# Patient Record
Sex: Female | Born: 1960 | Hispanic: No | Smoking: Never smoker
Health system: Southern US, Community
[De-identification: ages and names within clinical notes are randomized; demographics above are authoritative.]

## PROBLEM LIST (undated history)

## (undated) DIAGNOSIS — I639 Cerebral infarction, unspecified: Secondary | ICD-10-CM

---

## 2016-01-31 ENCOUNTER — Ambulatory Visit (HOSPITAL_COMMUNITY)
Admission: EM | Admit: 2016-01-31 | Discharge: 2016-01-31 | Disposition: A | Discharge status: Home / Self Care | Payer: Self-pay | Attending: Family Medicine | Admitting: Family Medicine

## 2016-01-31 ENCOUNTER — Encounter (HOSPITAL_COMMUNITY): Payer: Self-pay | Admitting: *Deleted

## 2016-01-31 DIAGNOSIS — K2100 Gastro-esophageal reflux disease with esophagitis, without bleeding: Secondary | ICD-10-CM

## 2016-01-31 LAB — POCT I-STAT, CHEM 8
BUN: 17 mg/dL (ref 6–20)
CALCIUM ION: 1.27 mmol/L (ref 1.13–1.30)
CHLORIDE: 108 mmol/L (ref 101–111)
Creatinine, Ser: 0.8 mg/dL (ref 0.44–1.00)
Glucose, Bld: 93 mg/dL (ref 65–99)
HCT: 38 % (ref 36.0–46.0)
Hemoglobin: 12.9 g/dL (ref 12.0–15.0)
POTASSIUM: 4.1 mmol/L (ref 3.5–5.1)
SODIUM: 143 mmol/L (ref 135–145)
TCO2: 25 mmol/L (ref 0–100)

## 2016-01-31 LAB — POCT H PYLORI SCREEN: H. PYLORI SCREEN, POC: NEGATIVE

## 2016-01-31 MED ORDER — GI COCKTAIL ~~LOC~~
ORAL | Status: AC
  Filled 2016-01-31: qty 30

## 2016-01-31 MED ORDER — GI COCKTAIL ~~LOC~~
30.0000 mL | Freq: Once | ORAL | Status: AC
  Administered 2016-01-31: 30 mL via ORAL

## 2016-01-31 MED ORDER — RANITIDINE HCL 150 MG PO TABS: 150.0000 mg | ORAL_TABLET | Freq: Two times a day (BID) | ORAL | 0 refills | Status: DC

## 2016-01-31 NOTE — ED Triage Notes (Signed)
Pt has  A  History  Of   Diane RifeGerd   She reports     Burning  abd  Pain     With  Symptoms  Of pain    From  Belly button to throat      daughter has  Been  Giving  Pt  nexium  For the  Symptoms      Pt  Also  Reports  dizzyness  And  A  Headache     Pt  Reports

## 2016-01-31 NOTE — ED Provider Notes (Signed)
[facility]    CSN: [id number] Arrival date & time: 01/31/16  1012  First Provider Contact:  First MD Initiated Contact with Patient 01/31/16 1103        History   Chief Complaint Chief Complaint  Patient presents with  . Gastroesophageal Reflux    HPI Diane Berry is a 55 y.o. female.   The history is provided by the patient.  Gastroesophageal Reflux  This is a chronic problem. The current episode started more than 1 week ago (approx 14mo of sx per daughter., burning epigastric sx.). The problem has not changed since onset.Associated symptoms include abdominal pain.    History reviewed. No pertinent past medical history.  There are no active problems to display for this patient.   History reviewed. No pertinent surgical history.  OB History    No data available       Home Medications    Prior to Admission medications   Not on File    Family History History reviewed. No pertinent family history.  Social History Social History  Substance Use Topics  . Smoking status: Not on file  . Smokeless tobacco: Not on file  . Alcohol use Not on file     Allergies   Review of patient's allergies indicates not on file.   Review of Systems Review of Systems  Constitutional: Negative.  Negative for appetite change and fever.  Gastrointestinal: Positive for abdominal pain. Negative for abdominal distention, blood in stool, diarrhea, nausea and vomiting.  All other systems reviewed and are negative.    Physical Exam Triage Vital Signs ED Triage Vitals  Enc Vitals Group     BP 01/31/16 1031 175/97     Pulse Rate 01/31/16 1031 67     Resp 01/31/16 1031 16     Temp 01/31/16 1031 98.4 F (36.9 C)     Temp Source 01/31/16 1031 Oral     SpO2 01/31/16 1031 98 %     Weight --      Height --      Head Circumference --      Peak Flow --      Pain Score 01/31/16 1057 7     Pain Loc --      Pain Edu? --      Excl. in GC? --    No data  found.   Updated Vital Signs BP 175/97 (BP Location: Left Arm)   Pulse 67   Temp 98.4 F (36.9 C) (Oral)   Resp 16   SpO2 98%   Visual Acuity Right Eye Distance:   Left Eye Distance:   Bilateral Distance:    Right Eye Near:   Left Eye Near:    Bilateral Near:     Physical Exam  Constitutional: She is oriented to person, place, and time. She appears well-developed and well-nourished. No distress.  Cardiovascular: Normal rate, regular rhythm, normal heart sounds and intact distal pulses.   Pulmonary/Chest: Effort normal and breath sounds normal.  Abdominal: Soft. Bowel sounds are normal. She exhibits no mass. There is no tenderness. There is no rebound and no guarding. No hernia.  Neurological: She is alert and oriented to person, place, and time.  Skin: Skin is warm and dry.  Nursing note and vitals reviewed.    UC Treatments / Results  Labs (all labs ordered are listed, but only abnormal results are displayed) Labs Reviewed - No data to display  h pylori and I-stat neg  EKG  EKG Interpretation  None       Radiology No results found.  Procedures Procedures (including critical care time)  Medications Ordered in UC Medications - No data to display   Initial Impression / Assessment and Plan / UC Course  I have reviewed the triage vital signs and the nursing notes.  Pertinent labs & imaging results that were available during my care of the patient were reviewed by me and considered in my medical decision making (see chart for details).  Clinical Course      Final Clinical Impressions(s) / UC Diagnoses   Final diagnoses:  None    New Prescriptions New Prescriptions   No medications on file     Linna HoffJames D Kennidi Yoshida, MD 01/31/16 1211

## 2017-07-23 ENCOUNTER — Other Ambulatory Visit: Payer: Self-pay

## 2017-07-23 ENCOUNTER — Inpatient Hospital Stay (HOSPITAL_COMMUNITY)
Admission: EM | Admit: 2017-07-23 | Discharge: 2017-07-28 | DRG: 065 | Disposition: A | Discharge status: Home Health | Payer: Medicaid Other | Attending: Neurology | Admitting: Neurology

## 2017-07-23 ENCOUNTER — Encounter (HOSPITAL_COMMUNITY): Payer: Self-pay | Admitting: Emergency Medicine

## 2017-07-23 ENCOUNTER — Emergency Department (HOSPITAL_COMMUNITY): Payer: Medicaid Other

## 2017-07-23 DIAGNOSIS — I6622 Occlusion and stenosis of left posterior cerebral artery: Secondary | ICD-10-CM | POA: Diagnosis present

## 2017-07-23 DIAGNOSIS — E872 Acidosis, unspecified: Secondary | ICD-10-CM

## 2017-07-23 DIAGNOSIS — E669 Obesity, unspecified: Secondary | ICD-10-CM | POA: Diagnosis present

## 2017-07-23 DIAGNOSIS — R519 Headache, unspecified: Secondary | ICD-10-CM

## 2017-07-23 DIAGNOSIS — I672 Cerebral atherosclerosis: Secondary | ICD-10-CM | POA: Diagnosis present

## 2017-07-23 DIAGNOSIS — E876 Hypokalemia: Secondary | ICD-10-CM | POA: Diagnosis not present

## 2017-07-23 DIAGNOSIS — R809 Proteinuria, unspecified: Secondary | ICD-10-CM | POA: Diagnosis not present

## 2017-07-23 DIAGNOSIS — R6510 Systemic inflammatory response syndrome (SIRS) of non-infectious origin without acute organ dysfunction: Secondary | ICD-10-CM | POA: Diagnosis present

## 2017-07-23 DIAGNOSIS — E785 Hyperlipidemia, unspecified: Secondary | ICD-10-CM | POA: Diagnosis present

## 2017-07-23 DIAGNOSIS — Z8673 Personal history of transient ischemic attack (TIA), and cerebral infarction without residual deficits: Secondary | ICD-10-CM

## 2017-07-23 DIAGNOSIS — Z23 Encounter for immunization: Secondary | ICD-10-CM

## 2017-07-23 DIAGNOSIS — M4802 Spinal stenosis, cervical region: Secondary | ICD-10-CM | POA: Diagnosis present

## 2017-07-23 DIAGNOSIS — I700 Atherosclerosis of aorta: Secondary | ICD-10-CM | POA: Diagnosis present

## 2017-07-23 DIAGNOSIS — I161 Hypertensive emergency: Secondary | ICD-10-CM | POA: Diagnosis present

## 2017-07-23 DIAGNOSIS — R402142 Coma scale, eyes open, spontaneous, at arrival to emergency department: Secondary | ICD-10-CM | POA: Diagnosis present

## 2017-07-23 DIAGNOSIS — R402362 Coma scale, best motor response, obeys commands, at arrival to emergency department: Secondary | ICD-10-CM | POA: Diagnosis present

## 2017-07-23 DIAGNOSIS — D649 Anemia, unspecified: Secondary | ICD-10-CM | POA: Diagnosis present

## 2017-07-23 DIAGNOSIS — I10 Essential (primary) hypertension: Secondary | ICD-10-CM | POA: Diagnosis present

## 2017-07-23 DIAGNOSIS — I615 Nontraumatic intracerebral hemorrhage, intraventricular: Secondary | ICD-10-CM

## 2017-07-23 DIAGNOSIS — I6522 Occlusion and stenosis of left carotid artery: Secondary | ICD-10-CM | POA: Diagnosis present

## 2017-07-23 DIAGNOSIS — R030 Elevated blood-pressure reading, without diagnosis of hypertension: Secondary | ICD-10-CM | POA: Diagnosis present

## 2017-07-23 DIAGNOSIS — R402252 Coma scale, best verbal response, oriented, at arrival to emergency department: Secondary | ICD-10-CM | POA: Diagnosis present

## 2017-07-23 DIAGNOSIS — R6889 Other general symptoms and signs: Secondary | ICD-10-CM

## 2017-07-23 DIAGNOSIS — I629 Nontraumatic intracranial hemorrhage, unspecified: Secondary | ICD-10-CM | POA: Diagnosis present

## 2017-07-23 DIAGNOSIS — N179 Acute kidney failure, unspecified: Secondary | ICD-10-CM | POA: Diagnosis not present

## 2017-07-23 DIAGNOSIS — M50322 Other cervical disc degeneration at C5-C6 level: Secondary | ICD-10-CM | POA: Diagnosis present

## 2017-07-23 DIAGNOSIS — Z6833 Body mass index (BMI) 33.0-33.9, adult: Secondary | ICD-10-CM

## 2017-07-23 DIAGNOSIS — R29701 NIHSS score 1: Secondary | ICD-10-CM | POA: Diagnosis present

## 2017-07-23 DIAGNOSIS — R51 Headache: Secondary | ICD-10-CM

## 2017-07-23 DIAGNOSIS — I619 Nontraumatic intracerebral hemorrhage, unspecified: Secondary | ICD-10-CM | POA: Diagnosis present

## 2017-07-23 DIAGNOSIS — I611 Nontraumatic intracerebral hemorrhage in hemisphere, cortical: Secondary | ICD-10-CM

## 2017-07-23 DIAGNOSIS — Z8249 Family history of ischemic heart disease and other diseases of the circulatory system: Secondary | ICD-10-CM

## 2017-07-23 DIAGNOSIS — E871 Hypo-osmolality and hyponatremia: Secondary | ICD-10-CM | POA: Diagnosis not present

## 2017-07-23 HISTORY — DX: Cerebral infarction, unspecified: I63.9

## 2017-07-23 LAB — CBC WITH DIFFERENTIAL/PLATELET
BASOS ABS: 0 10*3/uL (ref 0.0–0.1)
Basophils Relative: 0 %
Eosinophils Absolute: 0.1 10*3/uL (ref 0.0–0.7)
Eosinophils Relative: 1 %
HEMATOCRIT: 36.6 % (ref 36.0–46.0)
HEMOGLOBIN: 11.7 g/dL — AB (ref 12.0–15.0)
LYMPHS PCT: 31 %
Lymphs Abs: 2.2 10*3/uL (ref 0.7–4.0)
MCH: 22.9 pg — ABNORMAL LOW (ref 26.0–34.0)
MCHC: 32 g/dL (ref 30.0–36.0)
MCV: 71.8 fL — ABNORMAL LOW (ref 78.0–100.0)
MONOS PCT: 4 %
Monocytes Absolute: 0.3 10*3/uL (ref 0.1–1.0)
NEUTROS PCT: 64 %
Neutro Abs: 4.4 10*3/uL (ref 1.7–7.7)
Platelets: 232 10*3/uL (ref 150–400)
RBC: 5.1 MIL/uL (ref 3.87–5.11)
RDW: 15.4 % (ref 11.5–15.5)
WBC: 7 10*3/uL (ref 4.0–10.5)

## 2017-07-23 LAB — URINALYSIS, ROUTINE W REFLEX MICROSCOPIC
BACTERIA UA: NONE SEEN
BILIRUBIN URINE: NEGATIVE
GLUCOSE, UA: NEGATIVE mg/dL
Ketones, ur: NEGATIVE mg/dL
LEUKOCYTES UA: NEGATIVE
NITRITE: NEGATIVE
Specific Gravity, Urine: 1.024 (ref 1.005–1.030)
pH: 5 (ref 5.0–8.0)

## 2017-07-23 LAB — I-STAT BETA HCG BLOOD, ED (MC, WL, AP ONLY)

## 2017-07-23 LAB — COMPREHENSIVE METABOLIC PANEL
ALT: 16 U/L (ref 14–54)
AST: 41 U/L (ref 15–41)
Albumin: 4.5 g/dL (ref 3.5–5.0)
Alkaline Phosphatase: 96 U/L (ref 38–126)
Anion gap: 11 (ref 5–15)
BUN: 11 mg/dL (ref 6–20)
CHLORIDE: 106 mmol/L (ref 101–111)
CO2: 20 mmol/L — AB (ref 22–32)
Calcium: 9.7 mg/dL (ref 8.9–10.3)
Creatinine, Ser: 0.76 mg/dL (ref 0.44–1.00)
GFR calc non Af Amer: >60 mL/min (ref >60)
Glucose, Bld: 126 mg/dL — ABNORMAL HIGH (ref 65–99)
Potassium: 3.6 mmol/L (ref 3.5–5.1)
SODIUM: 137 mmol/L (ref 135–145)
Total Bilirubin: 1.2 mg/dL (ref 0.3–1.2)
Total Protein: 8.4 g/dL — ABNORMAL HIGH (ref 6.5–8.1)

## 2017-07-23 LAB — I-STAT CG4 LACTIC ACID, ED: Lactic Acid, Venous: 3.83 mmol/L (ref 0.5–1.9)

## 2017-07-23 NOTE — ED Notes (Signed)
Notified nurse first istat lactic acid results.

## 2017-07-23 NOTE — ED Notes (Signed)
Dr. Blinda LeatherwoodPollina notified of lactic acid results of 3.83 and pt going to D[facility].

## 2017-07-23 NOTE — ED Triage Notes (Signed)
Pt having generalized body ache, with fever, chills, nausea and vomiting since this morning.

## 2017-07-24 ENCOUNTER — Inpatient Hospital Stay (HOSPITAL_COMMUNITY): Payer: Medicaid Other

## 2017-07-24 ENCOUNTER — Observation Stay (HOSPITAL_COMMUNITY): Payer: Medicaid Other

## 2017-07-24 ENCOUNTER — Emergency Department (HOSPITAL_COMMUNITY): Payer: Medicaid Other

## 2017-07-24 ENCOUNTER — Encounter (HOSPITAL_COMMUNITY): Payer: Self-pay | Admitting: Internal Medicine

## 2017-07-24 DIAGNOSIS — D509 Iron deficiency anemia, unspecified: Secondary | ICD-10-CM

## 2017-07-24 DIAGNOSIS — R402362 Coma scale, best motor response, obeys commands, at arrival to emergency department: Secondary | ICD-10-CM | POA: Diagnosis present

## 2017-07-24 DIAGNOSIS — Z8249 Family history of ischemic heart disease and other diseases of the circulatory system: Secondary | ICD-10-CM | POA: Diagnosis not present

## 2017-07-24 DIAGNOSIS — N179 Acute kidney failure, unspecified: Secondary | ICD-10-CM | POA: Diagnosis not present

## 2017-07-24 DIAGNOSIS — I161 Hypertensive emergency: Secondary | ICD-10-CM | POA: Diagnosis present

## 2017-07-24 DIAGNOSIS — I615 Nontraumatic intracerebral hemorrhage, intraventricular: Secondary | ICD-10-CM | POA: Diagnosis present

## 2017-07-24 DIAGNOSIS — R402252 Coma scale, best verbal response, oriented, at arrival to emergency department: Secondary | ICD-10-CM | POA: Diagnosis present

## 2017-07-24 DIAGNOSIS — R29701 NIHSS score 1: Secondary | ICD-10-CM | POA: Diagnosis present

## 2017-07-24 DIAGNOSIS — M50322 Other cervical disc degeneration at C5-C6 level: Secondary | ICD-10-CM | POA: Diagnosis present

## 2017-07-24 DIAGNOSIS — E785 Hyperlipidemia, unspecified: Secondary | ICD-10-CM | POA: Diagnosis present

## 2017-07-24 DIAGNOSIS — E872 Acidosis: Secondary | ICD-10-CM | POA: Diagnosis present

## 2017-07-24 DIAGNOSIS — E876 Hypokalemia: Secondary | ICD-10-CM | POA: Diagnosis not present

## 2017-07-24 DIAGNOSIS — I672 Cerebral atherosclerosis: Secondary | ICD-10-CM | POA: Diagnosis present

## 2017-07-24 DIAGNOSIS — R809 Proteinuria, unspecified: Secondary | ICD-10-CM | POA: Diagnosis not present

## 2017-07-24 DIAGNOSIS — Z6833 Body mass index (BMI) 33.0-33.9, adult: Secondary | ICD-10-CM | POA: Diagnosis not present

## 2017-07-24 DIAGNOSIS — R402142 Coma scale, eyes open, spontaneous, at arrival to emergency department: Secondary | ICD-10-CM | POA: Diagnosis present

## 2017-07-24 DIAGNOSIS — D649 Anemia, unspecified: Secondary | ICD-10-CM | POA: Diagnosis present

## 2017-07-24 DIAGNOSIS — E669 Obesity, unspecified: Secondary | ICD-10-CM | POA: Diagnosis present

## 2017-07-24 DIAGNOSIS — I700 Atherosclerosis of aorta: Secondary | ICD-10-CM | POA: Diagnosis present

## 2017-07-24 DIAGNOSIS — M4802 Spinal stenosis, cervical region: Secondary | ICD-10-CM | POA: Diagnosis present

## 2017-07-24 DIAGNOSIS — R6510 Systemic inflammatory response syndrome (SIRS) of non-infectious origin without acute organ dysfunction: Secondary | ICD-10-CM

## 2017-07-24 DIAGNOSIS — I619 Nontraumatic intracerebral hemorrhage, unspecified: Secondary | ICD-10-CM | POA: Diagnosis present

## 2017-07-24 DIAGNOSIS — R030 Elevated blood-pressure reading, without diagnosis of hypertension: Secondary | ICD-10-CM | POA: Diagnosis present

## 2017-07-24 DIAGNOSIS — Z23 Encounter for immunization: Secondary | ICD-10-CM | POA: Diagnosis not present

## 2017-07-24 DIAGNOSIS — I629 Nontraumatic intracranial hemorrhage, unspecified: Secondary | ICD-10-CM | POA: Diagnosis present

## 2017-07-24 DIAGNOSIS — Z8673 Personal history of transient ischemic attack (TIA), and cerebral infarction without residual deficits: Secondary | ICD-10-CM | POA: Diagnosis not present

## 2017-07-24 DIAGNOSIS — I639 Cerebral infarction, unspecified: Secondary | ICD-10-CM

## 2017-07-24 DIAGNOSIS — I10 Essential (primary) hypertension: Secondary | ICD-10-CM | POA: Diagnosis present

## 2017-07-24 DIAGNOSIS — E871 Hypo-osmolality and hyponatremia: Secondary | ICD-10-CM | POA: Diagnosis not present

## 2017-07-24 HISTORY — DX: Cerebral infarction, unspecified: I63.9

## 2017-07-24 LAB — COMPREHENSIVE METABOLIC PANEL
ALBUMIN: 4.1 g/dL (ref 3.5–5.0)
ALT: 16 U/L (ref 14–54)
AST: 37 U/L (ref 15–41)
Alkaline Phosphatase: 87 U/L (ref 38–126)
Anion gap: 13 (ref 5–15)
BUN: 9 mg/dL (ref 6–20)
CHLORIDE: 105 mmol/L (ref 101–111)
CO2: 19 mmol/L — AB (ref 22–32)
Calcium: 8.9 mg/dL (ref 8.9–10.3)
Creatinine, Ser: 0.77 mg/dL (ref 0.44–1.00)
GFR calc Af Amer: >60 mL/min (ref >60)
GLUCOSE: 133 mg/dL — AB (ref 65–99)
POTASSIUM: 3.4 mmol/L — AB (ref 3.5–5.1)
SODIUM: 137 mmol/L (ref 135–145)
Total Bilirubin: 1.2 mg/dL (ref 0.3–1.2)
Total Protein: 7.9 g/dL (ref 6.5–8.1)

## 2017-07-24 LAB — VITAMIN B12: VITAMIN B 12: 1254 pg/mL — AB (ref 180–914)

## 2017-07-24 LAB — CBC WITH DIFFERENTIAL/PLATELET
Basophils Absolute: 0 10*3/uL (ref 0.0–0.1)
Basophils Relative: 0 %
EOS PCT: 0 %
Eosinophils Absolute: 0 10*3/uL (ref 0.0–0.7)
HCT: 35 % — ABNORMAL LOW (ref 36.0–46.0)
Hemoglobin: 11 g/dL — ABNORMAL LOW (ref 12.0–15.0)
LYMPHS ABS: 1 10*3/uL (ref 0.7–4.0)
LYMPHS PCT: 13 %
MCH: 22.6 pg — AB (ref 26.0–34.0)
MCHC: 31.4 g/dL (ref 30.0–36.0)
MCV: 72 fL — AB (ref 78.0–100.0)
MONOS PCT: 2 %
Monocytes Absolute: 0.1 10*3/uL (ref 0.1–1.0)
Neutro Abs: 6.4 10*3/uL (ref 1.7–7.7)
Neutrophils Relative %: 85 %
PLATELETS: 208 10*3/uL (ref 150–400)
RBC: 4.86 MIL/uL (ref 3.87–5.11)
RDW: 15.4 % (ref 11.5–15.5)
WBC: 7.6 10*3/uL (ref 4.0–10.5)

## 2017-07-24 LAB — INFLUENZA PANEL BY PCR (TYPE A & B)
Influenza A By PCR: NEGATIVE
Influenza B By PCR: NEGATIVE

## 2017-07-24 LAB — PROTIME-INR
INR: 0.99
Prothrombin Time: 13 seconds (ref 11.4–15.2)

## 2017-07-24 LAB — FOLATE: FOLATE: 23.6 ng/mL (ref >5.9)

## 2017-07-24 LAB — FERRITIN: Ferritin: 188 ng/mL (ref 11–307)

## 2017-07-24 LAB — HEMOGLOBIN A1C
Hgb A1c MFr Bld: 5.1 % (ref 4.8–5.6)
Mean Plasma Glucose: 99.67 mg/dL

## 2017-07-24 LAB — GLUCOSE, CAPILLARY
Glucose-Capillary: 131 mg/dL — ABNORMAL HIGH (ref 65–99)
Glucose-Capillary: 124 mg/dL — ABNORMAL HIGH (ref 65–99)

## 2017-07-24 LAB — RETICULOCYTES
RBC.: 4.75 MIL/uL (ref 3.87–5.11)
RETIC CT PCT: 2.5 % (ref 0.4–3.1)
Retic Count, Absolute: 118.8 10*3/uL (ref 19.0–186.0)

## 2017-07-24 LAB — IRON AND TIBC
Iron: 42 ug/dL (ref 28–170)
SATURATION RATIOS: 16 % (ref 10.4–31.8)
TIBC: 265 ug/dL (ref 250–450)
UIBC: 223 ug/dL

## 2017-07-24 LAB — MRSA PCR SCREENING: MRSA by PCR: NEGATIVE

## 2017-07-24 LAB — HIV ANTIBODY (ROUTINE TESTING W REFLEX): HIV Screen 4th Generation wRfx: NONREACTIVE

## 2017-07-24 LAB — I-STAT CG4 LACTIC ACID, ED: LACTIC ACID, VENOUS: 1.43 mmol/L (ref 0.5–1.9)

## 2017-07-24 LAB — TROPONIN I: Troponin I: 0.03 ng/mL (ref <0.03)

## 2017-07-24 LAB — TSH: TSH: 0.26 u[IU]/mL — ABNORMAL LOW (ref 0.350–4.500)

## 2017-07-24 LAB — T4, FREE: Free T4: 0.95 ng/dL (ref 0.61–1.12)

## 2017-07-24 MED ORDER — DIPHENHYDRAMINE HCL 50 MG/ML IJ SOLN
25.0000 mg | Freq: Once | INTRAMUSCULAR | Status: AC
  Administered 2017-07-24: 25 mg via INTRAVENOUS
  Filled 2017-07-24: qty 1

## 2017-07-24 MED ORDER — PIPERACILLIN-TAZOBACTAM 3.375 G IVPB
3.3750 g | Freq: Three times a day (TID) | INTRAVENOUS | Status: DC
  Administered 2017-07-24: 3.375 g via INTRAVENOUS
  Filled 2017-07-24 (×2): qty 50

## 2017-07-24 MED ORDER — PIPERACILLIN-TAZOBACTAM 3.375 G IVPB 30 MIN
3.3750 g | Freq: Once | INTRAVENOUS | Status: AC
  Administered 2017-07-24: 3.375 g via INTRAVENOUS
  Filled 2017-07-24: qty 50

## 2017-07-24 MED ORDER — OSELTAMIVIR PHOSPHATE 75 MG PO CAPS
75.0000 mg | ORAL_CAPSULE | Freq: Once | ORAL | Status: AC
  Administered 2017-07-24: 75 mg via ORAL
  Filled 2017-07-24: qty 1

## 2017-07-24 MED ORDER — GADOBENATE DIMEGLUMINE 529 MG/ML IV SOLN
10.0000 mL | Freq: Once | INTRAVENOUS | Status: AC | PRN
Start: 2017-07-24 — End: 2017-07-24
  Administered 2017-07-24: 10 mL via INTRAVENOUS

## 2017-07-24 MED ORDER — ONDANSETRON HCL 4 MG/2ML IJ SOLN: 4.0000 mg | Freq: Four times a day (QID) | INTRAMUSCULAR | Status: DC | PRN

## 2017-07-24 MED ORDER — SENNOSIDES-DOCUSATE SODIUM 8.6-50 MG PO TABS
1.0000 | ORAL_TABLET | Freq: Two times a day (BID) | ORAL | Status: DC
  Administered 2017-07-24 – 2017-07-28 (×8): 1 via ORAL
  Filled 2017-07-24 (×8): qty 1

## 2017-07-24 MED ORDER — ACETAMINOPHEN 325 MG PO TABS
650.0000 mg | ORAL_TABLET | Freq: Four times a day (QID) | ORAL | Status: DC | PRN
  Administered 2017-07-24 – 2017-07-27 (×8): 650 mg via ORAL
  Filled 2017-07-24 (×8): qty 2

## 2017-07-24 MED ORDER — HYDRALAZINE HCL 20 MG/ML IJ SOLN
10.0000 mg | INTRAMUSCULAR | Status: DC | PRN
  Administered 2017-07-24 – 2017-07-26 (×4): 10 mg via INTRAVENOUS
  Filled 2017-07-24 (×4): qty 1

## 2017-07-24 MED ORDER — VANCOMYCIN HCL IN DEXTROSE 750-5 MG/150ML-% IV SOLN: 750.0000 mg | INTRAVENOUS | Status: DC

## 2017-07-24 MED ORDER — SODIUM CHLORIDE 0.9 % IV BOLUS (SEPSIS)
500.0000 mL | Freq: Once | INTRAVENOUS | Status: AC
  Administered 2017-07-24: 500 mL via INTRAVENOUS

## 2017-07-24 MED ORDER — SODIUM CHLORIDE 0.9 % IV BOLUS (SEPSIS)
1000.0000 mL | Freq: Once | INTRAVENOUS | Status: AC
  Administered 2017-07-24: 1000 mL via INTRAVENOUS

## 2017-07-24 MED ORDER — DIPHENHYDRAMINE HCL 50 MG/ML IJ SOLN
25.0000 mg | Freq: Every day | INTRAMUSCULAR | Status: DC
  Administered 2017-07-25 – 2017-07-28 (×2): 25 mg via INTRAVENOUS
  Filled 2017-07-24 (×2): qty 1

## 2017-07-24 MED ORDER — VANCOMYCIN HCL IN DEXTROSE 1-5 GM/200ML-% IV SOLN
1000.0000 mg | Freq: Once | INTRAVENOUS | Status: AC
  Administered 2017-07-24: 1000 mg via INTRAVENOUS
  Filled 2017-07-24: qty 200

## 2017-07-24 MED ORDER — PANTOPRAZOLE SODIUM 40 MG IV SOLR
40.0000 mg | Freq: Every day | INTRAVENOUS | Status: DC
  Administered 2017-07-24: 40 mg via INTRAVENOUS
  Filled 2017-07-24: qty 40

## 2017-07-24 MED ORDER — INFLUENZA VAC SPLIT QUAD 0.5 ML IM SUSY
0.5000 mL | PREFILLED_SYRINGE | INTRAMUSCULAR | Status: AC
  Administered 2017-07-25: 0.5 mL via INTRAMUSCULAR
  Filled 2017-07-24: qty 0.5

## 2017-07-24 MED ORDER — HYDROCHLOROTHIAZIDE 25 MG PO TABS
25.0000 mg | ORAL_TABLET | Freq: Every day | ORAL | Status: DC
  Administered 2017-07-24 – 2017-07-28 (×4): 25 mg via ORAL
  Filled 2017-07-24 (×4): qty 1

## 2017-07-24 MED ORDER — IOPAMIDOL (ISOVUE-370) INJECTION 76%
INTRAVENOUS | Status: AC
  Administered 2017-07-24: 50 mL
  Filled 2017-07-24: qty 50

## 2017-07-24 MED ORDER — ENOXAPARIN SODIUM 40 MG/0.4ML ~~LOC~~ SOLN: 40.0000 mg | Freq: Every day | SUBCUTANEOUS | Status: DC

## 2017-07-24 MED ORDER — HYDRALAZINE HCL 20 MG/ML IJ SOLN
10.0000 mg | INTRAMUSCULAR | Status: DC | PRN
  Administered 2017-07-24: 10 mg via INTRAVENOUS
  Filled 2017-07-24: qty 1

## 2017-07-24 MED ORDER — LISINOPRIL 10 MG PO TABS
10.0000 mg | ORAL_TABLET | Freq: Every day | ORAL | Status: DC
  Administered 2017-07-24 – 2017-07-28 (×4): 10 mg via ORAL
  Filled 2017-07-24 (×4): qty 1

## 2017-07-24 MED ORDER — NICARDIPINE HCL IN NACL 20-0.86 MG/200ML-% IV SOLN
3.0000 mg/h | INTRAVENOUS | Status: DC
  Administered 2017-07-24: 10 mg/h via INTRAVENOUS
  Administered 2017-07-24 (×2): 5 mg/h via INTRAVENOUS
  Administered 2017-07-24: 7.5 mg/h via INTRAVENOUS
  Administered 2017-07-25 (×2): 3 mg/h via INTRAVENOUS
  Filled 2017-07-24 (×6): qty 200

## 2017-07-24 MED ORDER — ONDANSETRON HCL 4 MG PO TABS: 4.0000 mg | ORAL_TABLET | Freq: Four times a day (QID) | ORAL | Status: DC | PRN

## 2017-07-24 MED ORDER — ACETAMINOPHEN 650 MG RE SUPP: 650.0000 mg | Freq: Four times a day (QID) | RECTAL | Status: DC | PRN

## 2017-07-24 MED ORDER — LABETALOL HCL 5 MG/ML IV SOLN
5.0000 mg | Freq: Once | INTRAVENOUS | Status: AC
  Administered 2017-07-24: 5 mg via INTRAVENOUS
  Filled 2017-07-24: qty 4

## 2017-07-24 MED ORDER — LABETALOL HCL 5 MG/ML IV SOLN
20.0000 mg | Freq: Once | INTRAVENOUS | Status: DC
  Filled 2017-07-24: qty 4

## 2017-07-24 MED ORDER — SODIUM CHLORIDE 0.9 % IV SOLN
INTRAVENOUS | Status: DC
  Administered 2017-07-24: 05:00:00 via INTRAVENOUS

## 2017-07-24 MED ORDER — STROKE: EARLY STAGES OF RECOVERY BOOK
Freq: Once | Status: AC
  Administered 2017-07-24: 11:00:00
  Filled 2017-07-24: qty 1

## 2017-07-24 MED ORDER — IOPAMIDOL (ISOVUE-300) INJECTION 61%
INTRAVENOUS | Status: AC
  Administered 2017-07-24: 100 mL
  Filled 2017-07-24: qty 100

## 2017-07-24 MED ORDER — SODIUM CHLORIDE 0.9 % IV BOLUS (SEPSIS)
250.0000 mL | Freq: Once | INTRAVENOUS | Status: AC
  Administered 2017-07-24: 250 mL via INTRAVENOUS

## 2017-07-24 NOTE — H&P (Addendum)
History and Physical    Mechele CollinHhlek Rah Lan ZOX:[id number] DOB: 09/05/1960 DOA: 07/23/2017  PCP: System, Pcp Not In  Patient coming from: Home.  Patient's daughter provided [place] translation.  Chief Complaint: Generalized body ache.  HPI: Diane Berry is a 57 y.o. female with history of elevated blood pressure per patient's daughter who has been in [place] last [place] years prior to that she was in [place] presents to the ER with complaints of generalized body ache over the last 2 days.  Patient also has been having multiple complaints including abdominal discomfort and headache.  Denies any nausea vomiting diarrhea chest pain shortness of breath or productive cough.  ED Course: In the ER CT abdomen and pelvis done showed nonspecific findings.  Patient had elevated lactate levels which improved with fluids.  Patient was mildly febrile for which blood cultures urine and urine cultures were sent influenza PCR was done which was negative patient's lactate levels improved with fluids.  Patient had markedly elevated blood pressure for which patient has been placed on IV hydralazine.  Patient had a Arletha GrippeRedman reaction with vancomycin involving the upper extremities which improved with IV Benadryl.  On my exam patient is complaining of occipital headache with no visual symptoms or focal deficits.  Patient is being admitted for SIRS.  Review of Systems: As per HPI, rest all negative.   History reviewed. No pertinent past medical history.  History reviewed. No pertinent surgical history.   reports that  has never smoked. she has never used smokeless tobacco. She reports that she does not drink alcohol or use drugs.  No Known Allergies  Family History  Problem Relation Age of Onset  . Hypertension Other     Prior to Admission medications   Medication Sig Start Date End Date Taking? Authorizing Provider  ranitidine (ZANTAC) 150 MG tablet Take 1 tablet (150 mg total) by mouth 2 (two) times  daily. 01/31/16   Linna HoffKindl, James D, MD    Physical Exam: Vitals:   07/24/17 0019 07/24/17 0115 07/24/17 0245 07/24/17 0305  BP:  (!) 190/158 (!) 186/99   Pulse: 89 (!) 110 99   Resp:  (!) 31 (!) 24   Temp:    99.8 F (37.7 C)  TempSrc:    Oral  SpO2: 100% 100% 100%   Weight:      Height:          Constitutional: Moderately built and nourished. Vitals:   07/24/17 0019 07/24/17 0115 07/24/17 0245 07/24/17 0305  BP:  (!) 190/158 (!) 186/99   Pulse: 89 (!) 110 99   Resp:  (!) 31 (!) 24   Temp:    99.8 F (37.7 C)  TempSrc:    Oral  SpO2: 100% 100% 100%   Weight:      Height:       Eyes: Anicteric no pallor. ENMT: No discharge from the ears eyes nose or mouth. Neck: No mass felt.  No neck rigidity not clearly elicitable. Respiratory: No rhonchi or crepitations. Cardiovascular: S1-S2 heard. Abdomen: Soft nontender bowel sounds present. Musculoskeletal: No edema.  No joint effusion. Skin: No rash. Neurologic: Alert awake oriented to time place and person.  Moves all extremities 5 x 5.  No facial asymmetry tongue is midline pupils equal and reactive to light. Psychiatric: Appears normal.   Labs on Admission: I have personally reviewed following labs and imaging studies  CBC: Recent Labs  Lab 07/23/17 2040  WBC 7.0  NEUTROABS 4.4  HGB  11.7*  HCT 36.6  MCV 71.8*  PLT 232   Basic Metabolic Panel: Recent Labs  Lab 07/23/17 2040  NA 137  K 3.6  CL 106  CO2 20*  GLUCOSE 126*  BUN 11  CREATININE 0.76  CALCIUM 9.7   GFR: Estimated Creatinine Clearance: 38.8 mL/min (by C-G formula based on SCr of 0.76 mg/dL). Liver Function Tests: Recent Labs  Lab 07/23/17 2040  AST 41  ALT 16  ALKPHOS 96  BILITOT 1.2  PROT 8.4*  ALBUMIN 4.5   No results for input(s): LIPASE, AMYLASE in the last 168 hours. No results for input(s): AMMONIA in the last 168 hours. Coagulation Profile: No results for input(s): INR, PROTIME in the last 168 hours. Cardiac Enzymes: No  results for input(s): CKTOTAL, CKMB, CKMBINDEX, TROPONINI in the last 168 hours. BNP (last 3 results) No results for input(s): PROBNP in the last 8760 hours. HbA1C: No results for input(s): HGBA1C in the last 72 hours. CBG: No results for input(s): GLUCAP in the last 168 hours. Lipid Profile: No results for input(s): CHOL, HDL, LDLCALC, TRIG, CHOLHDL, LDLDIRECT in the last 72 hours. Thyroid Function Tests: No results for input(s): TSH, T4TOTAL, FREET4, T3FREE, THYROIDAB in the last 72 hours. Anemia Panel: No results for input(s): VITAMINB12, FOLATE, FERRITIN, TIBC, IRON, RETICCTPCT in the last 72 hours. Urine analysis:    Component Value Date/Time   COLORURINE YELLOW 07/23/2017 2033   APPEARANCEUR CLEAR 07/23/2017 2033   LABSPEC 1.024 07/23/2017 2033   PHURINE 5.0 07/23/2017 2033   GLUCOSEU NEGATIVE 07/23/2017 2033   HGBUR SMALL (A) 07/23/2017 2033   BILIRUBINUR NEGATIVE 07/23/2017 2033   KETONESUR NEGATIVE 07/23/2017 2033   PROTEINUR >=300 (A) 07/23/2017 2033   NITRITE NEGATIVE 07/23/2017 2033   LEUKOCYTESUR NEGATIVE 07/23/2017 2033   Sepsis Labs: @LABRCNTIP (procalcitonin:4,lacticidven:4) )No results found for this or any previous visit (from the past 240 hour(s)).   Radiological Exams on Admission: Dg Chest 2 View  Result Date: 07/23/2017 CLINICAL DATA:  Cough, shortness of breath, fever, chills, and nausea. Former smoker. EXAM: CHEST  2 VIEW COMPARISON:  None. FINDINGS: The cardiac silhouette is borderline to mildly enlarged. Aortic atherosclerosis is noted. There is anterior eventration of the right hemidiaphragm. No confluent airspace opacity, edema, pleural effusion, or pneumothorax is identified. No acute osseous abnormality is seen. IMPRESSION: No active cardiopulmonary disease. Electronically Signed   By: Sebastian Ache M.D.   On: 07/23/2017 21:13   Ct Abdomen Pelvis W Contrast  Result Date: 07/24/2017 CLINICAL DATA:  Acute abdominal pain.  Nausea and vomiting. EXAM: CT  ABDOMEN AND PELVIS WITH CONTRAST TECHNIQUE: Multidetector CT imaging of the abdomen and pelvis was performed using the standard protocol following bolus administration of intravenous contrast. CONTRAST:  ISOVUE-300 IOPAMIDOL (ISOVUE-300) INJECTION 61% COMPARISON:  None. FINDINGS: Lower chest: Motion artifact.  No consolidation or pleural fluid. Hepatobiliary: No focal liver abnormality is seen. No gallstones, gallbladder wall thickening, or biliary dilatation. Pancreas: No ductal dilatation or inflammation. Spleen: Normal in size without focal abnormality. Adrenals/Urinary Tract: No adrenal nodule. Mild motion artifact through the kidneys limit assessment. Extrarenal pelvis configuration of the left kidney. Prominence of both renal collecting systems. Urinary bladder is distended. No bladder wall thickening. Multiple small cortical low-density lesions in both kidneys are too small to accurately characterize. Stomach/Bowel: Lack of enteric contrast and paucity of intra-abdominal fat limits bowel assessment. The stomach is nondistended. No bowel obstruction. Equivocal wall thickening at the descending/sigmoid colon junction versus more likely nondistention. Normal appendix. No bowel inflammation.  Vascular/Lymphatic: Mild aortic atherosclerosis. No aneurysm. No enlarged abdominal or pelvic lymph nodes. Small calcified mesenteric node in the right lower abdomen. Reproductive: Uterus and bilateral adnexa are unremarkable. Other: No free air, free fluid, or intra-abdominal fluid collection. Musculoskeletal: There are no acute or suspicious osseous abnormalities. IMPRESSION: 1. Equivocal wall thickening of the descending/sigmoid colon junction versus more likely nondistention. 2.  Aortic Atherosclerosis (ICD10-I70.0). 3. Too small to characterize bilateral renal lesions, likely cysts. Electronically Signed   By: Rubye Oaks M.D.   On: 07/24/2017 01:28    EKG: Independently reviewed.  Normal sinus rhythm with  RSR pattern.  Assessment/Plan Principal Problem:   SIRS (systemic inflammatory response syndrome) (HCC) Active Problems:   Elevated blood pressure reading    1. SIRS -patient is empirically placed on Vanco and Zosyn.  Patient did have red man reaction to vancomycin for which patient was given Benadryl.  I have placed instructions to give vancomycin slowly with Benadryl.  For now we will continue antibiotics.  Follow cultures.  Continue with hydration.  Since patient has been having severe headache I have ordered stat CT head.  If headache does not improve with control of blood pressure then may need lumbar puncture. 2. Hypertensive urgency -patient's daughter states patient has been having elevated blood pressure last few months.  Follows up with urgent care does not have a regular doctor.  For now I have placed patient on PRN IV hydralazine and based on response and need definite antihypertensives. 3. Normocytic normochromic anemia -follow sed rate. 4. Abdominal discomfort -has resolved at this time.  CT was showing nonspecific findings.  Abdomen appears benign.  In addition I have ordered TSH and troponins.  Addendum -CT head was read as  intraventricular bleed.  Consult with neurologist Dr. Laurence Slate.  1 dose of labetalol 20 mg IV patient already received 1 dose of 10 mg IV hydralazine.  Cardene infusion ordered.  Patient transferred to PCU under neurology care.  Family updated.  Last blood pressure recorded was 117/60.   DVT prophylaxis: SCDs for now. Code Status: Full code. Family Communication: Patient's daughter. Disposition Plan: Home. Consults called: None. Admission status: Observation.   Eduard Clos MD [facility] Pager [phone].  If 7PM-7AM, please contact night-coverage [email or web address] Password [email or web address]  07/24/2017, 3:13 AM

## 2017-07-24 NOTE — Progress Notes (Signed)
CTA head and neck preliminarily reviewed. No LVO seen, specifically the basilar artery is mildly narrowed but with no hemodynamically significant stenosis. No underlying lesion to explain the intraventricular hemorrhage, which was most likely hypertensive. No definite hydrocephalus, but no prior studies to compare to.   Also obtaining renal ultrasound given proteinuria seen on U/A.  Continuing IV antihypertensive. Frequent neuro checks. Will obtain repeat CT in 12 hours.   Electronically signed: Dr. Caryl PinaEric Naesha Buckalew

## 2017-07-24 NOTE — ED Notes (Signed)
Pt. Experiences red blotches over body with itchiness after administration of vancomycin. Vanc immediately stopped. Blinda LeatherwoodPollina, MD made aware. Pt. In NAD. Respirations WDL. Educated pt. On signs of severe reaction.

## 2017-07-24 NOTE — Progress Notes (Signed)
Pt transported to MRI with RN and daughter at bedside. MRI completed and uneventful. SBP remained <140 as ordered (off cardene gtt).  Dr. Otelia LimesLindzen notified pt has completed MRI. Also requested diet as pt has passed swallow screen. MD ordered A1c lab, CBGs and heart healthy diet.

## 2017-07-24 NOTE — Consult Note (Signed)
Reason for Consult: Intraventricular hemorrhage Referring Physician: Dr. Wende Mott Rah Diane Berry is an 57 y.o. female.  HPI: Patient is a 57 year old right-handed individual who speaks only a foreign language. History is obtained through her daughter who accompanies her. She notes that she has been having neck pain and head pain with pain radiating down the back to the upper lumbar spine. This is been since yesterday. A CT scan demonstrates the presence of a right intraventricular hemorrhage that measures approximately 2 cm in maximum size. There is no evidence of hydrocephalus. Dr. Sandrea Matte contacted me regarding neurosurgical evaluation and possibility of any intervention.  History reviewed. No pertinent past medical history.  History reviewed. No pertinent surgical history.  Family History  Problem Relation Age of Onset  . Hypertension Sister     Social History:  reports that  has never smoked. she has never used smokeless tobacco. She reports that she does not drink alcohol or use drugs.  Allergies: No Known Allergies  Medications: I have reviewed the patient's current medications.  Results for orders placed or performed during the hospital encounter of 07/23/17 (from the past 48 hour(s))  Urinalysis, Routine w reflex microscopic     Status: Abnormal   Collection Time: 07/23/17  8:33 PM  Result Value Ref Range   Color, Urine YELLOW YELLOW   APPearance CLEAR CLEAR   Specific Gravity, Urine 1.024 1.005 - 1.030   pH 5.0 5.0 - 8.0   Glucose, UA NEGATIVE NEGATIVE mg/dL   Hgb urine dipstick SMALL (A) NEGATIVE   Bilirubin Urine NEGATIVE NEGATIVE   Ketones, ur NEGATIVE NEGATIVE mg/dL   Protein, ur >=300 (A) NEGATIVE mg/dL   Nitrite NEGATIVE NEGATIVE   Leukocytes, UA NEGATIVE NEGATIVE   RBC / HPF 0-5 0 - 5 RBC/hpf   WBC, UA 6-30 0 - 5 WBC/hpf   Bacteria, UA NONE SEEN NONE SEEN   Squamous Epithelial / LPF 0-5 (A) NONE SEEN   Mucus PRESENT     Comment: Performed at [facility], [place] [address], [place], [place] [address]  Comprehensive metabolic panel     Status: Abnormal   Collection Time: 07/23/17  8:40 PM  Result Value Ref Range   Sodium 137 135 - 145 mmol/L   Potassium 3.6 3.5 - 5.1 mmol/L   Chloride 106 101 - 111 mmol/L   CO2 20 (L) 22 - 32 mmol/L   Glucose, Bld 126 (H) 65 - 99 mg/dL   BUN 11 6 - 20 mg/dL   Creatinine, Ser 0.76 0.44 - 1.00 mg/dL   Calcium 9.7 8.9 - 10.3 mg/dL   Total Protein 8.4 (H) 6.5 - 8.1 g/dL   Albumin 4.5 3.5 - 5.0 g/dL   AST 41 15 - 41 U/L   ALT 16 14 - 54 U/L   Alkaline Phosphatase 96 38 - 126 U/L   Total Bilirubin 1.2 0.3 - 1.2 mg/dL   GFR calc non Af Amer >60 >60 mL/min   GFR calc Af Amer >60 >60 mL/min    Comment: (NOTE) The eGFR has been calculated using the CKD EPI equation. This calculation has not been validated in all clinical situations. eGFR's persistently <60 mL/min signify possible Chronic Kidney Disease.    Anion gap 11 5 - 15    Comment: Performed at [facility] [address] [place], [place] [address]  CBC with Differential     Status: Abnormal   Collection Time: 07/23/17  8:40 PM  Result Value Ref Range  WBC 7.0 4.0 - 10.5 K/uL   RBC 5.10 3.87 - 5.11 MIL/uL   Hemoglobin 11.7 (L) 12.0 - 15.0 g/dL   HCT 36.6 36.0 - 46.0 %   MCV 71.8 (L) 78.0 - 100.0 fL   MCH 22.9 (L) 26.0 - 34.0 pg   MCHC 32.0 30.0 - 36.0 g/dL   RDW 15.4 11.5 - 15.5 %   Platelets 232 150 - 400 K/uL   Neutrophils Relative % 64 %   Lymphocytes Relative 31 %   Monocytes Relative 4 %   Eosinophils Relative 1 %   Basophils Relative 0 %   Neutro Abs 4.4 1.7 - 7.7 K/uL   Lymphs Abs 2.2 0.7 - 4.0 K/uL   Monocytes Absolute 0.3 0.1 - 1.0 K/uL   Eosinophils Absolute 0.1 0.0 - 0.7 K/uL   Basophils Absolute 0.0 0.0 - 0.1 K/uL   Smear Review MORPHOLOGY UNREMARKABLE     Comment: Performed at [facility], [place] [address] [place], [place] [address]  I-Stat beta hCG blood, ED     Status: None   Collection Time: 07/23/17 11:04 PM   Result Value Ref Range   I-stat hCG, quantitative <5.0 <5 mIU/mL   Comment 3            Comment:   GEST. AGE      CONC.  (mIU/mL)   <=1 WEEK        5 - 50     2 WEEKS       50 - 500     3 WEEKS       100 - 10,000     4 WEEKS     1,000 - 30,000        FEMALE AND NON-PREGNANT FEMALE:     LESS THAN 5 mIU/mL   I-Stat CG4 Lactic Acid, ED     Status: Abnormal   Collection Time: 07/23/17 11:07 PM  Result Value Ref Range   Lactic Acid, Venous 3.83 (HH) 0.5 - 1.9 mmol/L   Comment NOTIFIED PHYSICIAN   I-Stat CG4 Lactic Acid, ED  (not at  [facility])     Status: None   Collection Time: 07/24/17 12:31 AM  Result Value Ref Range   Lactic Acid, Venous 1.43 0.5 - 1.9 mmol/L  Influenza panel by PCR (type A & B)     Status: None   Collection Time: 07/24/17  1:46 AM  Result Value Ref Range   Influenza A By PCR NEGATIVE NEGATIVE   Influenza B By PCR NEGATIVE NEGATIVE    Comment: (NOTE) The Xpert Xpress Flu assay is intended as an aid in the diagnosis of  influenza and should not be used as a sole basis for treatment.  This  assay is FDA approved for nasopharyngeal swab specimens only. Nasal  washings and aspirates are unacceptable for Xpert Xpress Flu testing. Performed at [facility], [place] [address], [place], [place] [address]   Comprehensive metabolic panel     Status: Abnormal   Collection Time: 07/24/17  4:15 AM  Result Value Ref Range   Sodium 137 135 - 145 mmol/L   Potassium 3.4 (L) 3.5 - 5.1 mmol/L   Chloride 105 101 - 111 mmol/L   CO2 19 (L) 22 - 32 mmol/L   Glucose, Bld 133 (H) 65 - 99 mg/dL   BUN 9 6 - 20 mg/dL   Creatinine, Ser 0.77 0.44 - 1.00 mg/dL   Calcium 8.9 8.9 - 10.3 mg/dL   Total  Protein 7.9 6.5 - 8.1 g/dL   Albumin 4.1 3.5 - 5.0 g/dL   AST 37 15 - 41 U/L   ALT 16 14 - 54 U/L   Alkaline Phosphatase 87 38 - 126 U/L   Total Bilirubin 1.2 0.3 - 1.2 mg/dL   GFR calc non Af Amer >60 >60 mL/min   GFR calc Af Amer >60 >60 mL/min    Comment: (NOTE) The eGFR has been  calculated using the CKD EPI equation. This calculation has not been validated in all clinical situations. eGFR's persistently <60 mL/min signify possible Chronic Kidney Disease.    Anion gap 13 5 - 15    Comment: Performed at [facility] [address] [place], [place] [address]  CBC WITH DIFFERENTIAL     Status: Abnormal   Collection Time: 07/24/17  4:15 AM  Result Value Ref Range   WBC 7.6 4.0 - 10.5 K/uL   RBC 4.86 3.87 - 5.11 MIL/uL   Hemoglobin 11.0 (L) 12.0 - 15.0 g/dL   HCT 35.0 (L) 36.0 - 46.0 %   MCV 72.0 (L) 78.0 - 100.0 fL   MCH 22.6 (L) 26.0 - 34.0 pg   MCHC 31.4 30.0 - 36.0 g/dL   RDW 15.4 11.5 - 15.5 %   Platelets 208 150 - 400 K/uL   Neutrophils Relative % 85 %   Neutro Abs 6.4 1.7 - 7.7 K/uL   Lymphocytes Relative 13 %   Lymphs Abs 1.0 0.7 - 4.0 K/uL   Monocytes Relative 2 %   Monocytes Absolute 0.1 0.1 - 1.0 K/uL   Eosinophils Relative 0 %   Eosinophils Absolute 0.0 0.0 - 0.7 K/uL   Basophils Relative 0 %   Basophils Absolute 0.0 0.0 - 0.1 K/uL    Comment: Performed at [facility] [address] [place], [place] [address]  TSH     Status: Abnormal   Collection Time: 07/24/17  4:19 AM  Result Value Ref Range   TSH 0.260 (L) 0.350 - 4.500 uIU/mL    Comment: Performed by a 3rd Generation assay with a functional sensitivity of <=0.01 uIU/mL. Performed at [facility], [place] [address] [place], [place] [address]   Troponin I     Status: Abnormal   Collection Time: 07/24/17  6:59 AM  Result Value Ref Range   Troponin I 0.03 (HH) <0.03 ng/mL    Comment: CRITICAL RESULT CALLED TO, READ BACK BY AND VERIFIED WITH: Rolland Porter RN, [phone] 07/24/17 TH[place] V Performed at [facility] [address], [place], [place] [address]   T4, free     Status: None   Collection Time: 07/24/17  6:59 AM  Result Value Ref Range   Free T4 0.95 0.61 - 1.12 ng/dL    Comment: (NOTE) Biotin ingestion may interfere with free T4 tests. If the results  are inconsistent with the TSH level, previous test results, or the clinical presentation, then consider biotin interference. If needed, order repeat testing after stopping biotin. Performed at [facility], [place] [address], [place], [place] [address]   Vitamin B12     Status: Abnormal   Collection Time: 07/24/17  6:59 AM  Result Value Ref Range   Vitamin B-12 1,254 (H) 180 - 914 pg/mL    Comment: (NOTE) This assay is not validated for testing neonatal or myeloproliferative syndrome specimens for Vitamin B12 levels. Performed at [facility], [place] [address] [place], [place] [address]  Folate     Status: None   Collection Time: 07/24/17  6:59 AM  Result Value Ref Range   Folate 23.6 >5.9 ng/mL    Comment: Performed at [facility] [address] [place], [place] [address]  Iron and TIBC     Status: None   Collection Time: 07/24/17  6:59 AM  Result Value Ref Range   Iron 42 28 - 170 ug/dL   TIBC 265 250 - 450 ug/dL   Saturation Ratios 16 10.4 - 31.8 %   UIBC 223 ug/dL    Comment: Performed at [facility], [place] [address], [place], [place] [address]  Ferritin     Status: None   Collection Time: 07/24/17  6:59 AM  Result Value Ref Range   Ferritin 188 11 - 307 ng/mL    Comment: Performed at [facility], [place] [address] [place], [place] [address]  Reticulocytes     Status: None   Collection Time: 07/24/17  6:59 AM  Result Value Ref Range   Retic Ct Pct 2.5 0.4 - 3.1 %   RBC. 4.75 3.87 - 5.11 MIL/uL   Retic Count, Absolute 118.8 19.0 - 186.0 K/uL    Comment: Performed at [facility] [address], [place], [place] [address]  Protime-INR     Status: None   Collection Time: 07/24/17  9:24 AM  Result Value Ref Range   Prothrombin Time 13.0 11.4 - 15.2 seconds   INR 0.99     Comment: Performed at [facility] [address], [place], [place] [address]    Ct Angio Head W Or Wo Contrast  Result Date: 07/24/2017 CLINICAL DATA:   Intracranial hemorrhage.  Hypertension EXAM: CT ANGIOGRAPHY HEAD AND NECK TECHNIQUE: Multidetector CT imaging of the head and neck was performed using the standard protocol during bolus administration of intravenous contrast. Multiplanar CT image reconstructions and MIPs were obtained to evaluate the vascular anatomy. Carotid stenosis measurements (when applicable) are obtained utilizing NASCET criteria, using the distal internal carotid diameter as the denominator. CONTRAST:  30m ISOVUE-370 IOPAMIDOL (ISOVUE-370) INJECTION 76% COMPARISON:  CT head 07/24/2017 FINDINGS: CTA NECK FINDINGS Aortic arch: Mild atherosclerotic disease aortic arch. Proximal great vessels widely patent. Right carotid system: Mild atherosclerotic calcification of the carotid bifurcation without significant stenosis Left carotid system: Atherosclerotic calcification at the carotid bifurcation. Less than 25% diameter stenosis left internal carotid artery. Vertebral arteries: Left vertebral artery dominant. Calcific plaque of the distal vertebral artery with severe stenosis bilaterally. Both vertebral arteries patent to the basilar. Skeleton: Prominent vascular markings in the skull. No acute skeletal abnormality. Other neck: Negative for soft tissue mass or edema Upper chest: Negative Review of the MIP images confirms the above findings CTA HEAD FINDINGS Anterior circulation: Extensive atherosclerotic calcification in the cavernous carotid bilaterally with moderate carotid stenosis bilaterally. Atherosclerotic irregularity in the anterior cerebral arteries and middle cerebral arteries bilaterally. Multiple areas of vessel irregularity and stenosis. No large vessel occlusion. Negative for vascular malformation. Posterior circulation: Severe calcific stenosis both vertebral arteries at the level of the skull base. Mild irregularity of the proximal basilar which is slightly dilated with a small filling defect felt to be plaque. No significant  basilar stenosis. PICA, superior cerebellar, and posterior cerebral arteries patent bilaterally. Atherosclerotic irregularity in the posterior cerebral arteries bilaterally. No large vessel occlusion. Negative for vascular malformation. Venous sinuses: Patent Anatomic variants: None Delayed phase: Intraventricular hemorrhage right lateral ventricle unchanged measuring 31 x  16 mm. Negative for hydrocephalus. No enhancing mass lesion. Review of the MIP images confirms the above findings IMPRESSION: Intraventricular hemorrhage right lateral ventricle unchanged. No underlying vascular malformation. Hemorrhage is presumably hypertensive. Severe intracranial atherosclerotic disease. Moderate calcific stenosis in the cavernous carotid bilaterally. Severe calcific stenosis distal vertebral artery bilaterally. Extensive atherosclerotic disease throughout the anterior, middle, and posterior cerebral arteries without large vessel occlusion Calcific stenosis in the carotid bifurcation bilaterally. No significant stenosis right carotid. Less than 25% diameter stenosis left internal carotid artery. Electronically Signed   By: Franchot Gallo M.D.   On: 07/24/2017 09:56   Dg Chest 2 View  Result Date: 07/23/2017 CLINICAL DATA:  Cough, shortness of breath, fever, chills, and nausea. Former smoker. EXAM: CHEST  2 VIEW COMPARISON:  None. FINDINGS: The cardiac silhouette is borderline to mildly enlarged. Aortic atherosclerosis is noted. There is anterior eventration of the right hemidiaphragm. No confluent airspace opacity, edema, pleural effusion, or pneumothorax is identified. No acute osseous abnormality is seen. IMPRESSION: No active cardiopulmonary disease. Electronically Signed   By: Logan Bores M.D.   On: 07/23/2017 21:13   Ct Head Wo Contrast  Result Date: 07/24/2017 CLINICAL DATA:  Headache, hypertension EXAM: CT HEAD WITHOUT CONTRAST TECHNIQUE: Contiguous axial images were obtained from the base of the skull through  the vertex without intravenous contrast. COMPARISON:  None. FINDINGS: Brain: Acute hemorrhage in the right lateral ventricle. Hematoma volume 4.5 mL. No other acute hemorrhage. Negative for hydrocephalus. 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 Chronic microvascular ischemic change in the white matter. 8 mm coarse calcification in the left frontal lobe appears benign. Negative for acute infarct.  No mass or edema. Vascular: Negative for hyperdense vessel Skull: Prominent vascular markings throughout the skull Sinuses/Orbits: Chronic mucosal edema and bony thickening of the maxillary sinus bilaterally. Negative orbit. Other: None IMPRESSION: Acute hemorrhage in the right lateral ventricle. Hematoma volume 4.5 mL. Negative for hydrocephalus. No underlying mass identified. CTA head recommended to evaluate for vascular malformation. Intracranial atherosclerotic disease with chronic microvascular ischemia. No acute ischemic infarction. These results were called by telephone at the time of interpretation on 07/24/2017 at 6:58 am to Dr. Gean Birchwood , who verbally acknowledged these results. Electronically Signed   By: Franchot Gallo M.D.   On: 07/24/2017 06:58   Ct Angio Neck W Or Wo Contrast  Result Date: 07/24/2017 CLINICAL DATA:  Intracranial hemorrhage.  Hypertension EXAM: CT ANGIOGRAPHY HEAD AND NECK TECHNIQUE: Multidetector CT imaging of the head and neck was performed using the standard protocol during bolus administration of intravenous contrast. Multiplanar CT image reconstructions and MIPs were obtained to evaluate the vascular anatomy. Carotid stenosis measurements (when applicable) are obtained utilizing NASCET criteria, using the distal internal carotid diameter as the denominator. CONTRAST:  94m ISOVUE-370 IOPAMIDOL (ISOVUE-370) INJECTION 76% COMPARISON:  CT head 07/24/2017 FINDINGS: CTA NECK FINDINGS Aortic arch: Mild atherosclerotic disease aortic arch. Proximal great vessels widely patent.  Right carotid system: Mild atherosclerotic calcification of the carotid bifurcation without significant stenosis Left carotid system: Atherosclerotic calcification at the carotid bifurcation. Less than 25% diameter stenosis left internal carotid artery. Vertebral arteries: Left vertebral artery dominant. Calcific plaque of the distal vertebral artery with severe stenosis bilaterally. Both vertebral arteries patent to the basilar. Skeleton: Prominent vascular markings in the skull. No acute skeletal abnormality. Other neck: Negative for soft tissue mass or edema Upper chest: Negative Review of the MIP images confirms the above findings CTA HEAD FINDINGS Anterior circulation: Extensive atherosclerotic calcification in the cavernous  carotid bilaterally with moderate carotid stenosis bilaterally. Atherosclerotic irregularity in the anterior cerebral arteries and middle cerebral arteries bilaterally. Multiple areas of vessel irregularity and stenosis. No large vessel occlusion. Negative for vascular malformation. Posterior circulation: Severe calcific stenosis both vertebral arteries at the level of the skull base. Mild irregularity of the proximal basilar which is slightly dilated with a small filling defect felt to be plaque. No significant basilar stenosis. PICA, superior cerebellar, and posterior cerebral arteries patent bilaterally. Atherosclerotic irregularity in the posterior cerebral arteries bilaterally. No large vessel occlusion. Negative for vascular malformation. Venous sinuses: Patent Anatomic variants: None Delayed phase: Intraventricular hemorrhage right lateral ventricle unchanged measuring 31 x 16 mm. Negative for hydrocephalus. No enhancing mass lesion. Review of the MIP images confirms the above findings IMPRESSION: Intraventricular hemorrhage right lateral ventricle unchanged. No underlying vascular malformation. Hemorrhage is presumably hypertensive. Severe intracranial atherosclerotic disease.  Moderate calcific stenosis in the cavernous carotid bilaterally. Severe calcific stenosis distal vertebral artery bilaterally. Extensive atherosclerotic disease throughout the anterior, middle, and posterior cerebral arteries without large vessel occlusion Calcific stenosis in the carotid bifurcation bilaterally. No significant stenosis right carotid. Less than 25% diameter stenosis left internal carotid artery. Electronically Signed   By: Franchot Gallo M.D.   On: 07/24/2017 09:56   Ct Abdomen Pelvis W Contrast  Result Date: 07/24/2017 CLINICAL DATA:  Acute abdominal pain.  Nausea and vomiting. EXAM: CT ABDOMEN AND PELVIS WITH CONTRAST TECHNIQUE: Multidetector CT imaging of the abdomen and pelvis was performed using the standard protocol following bolus administration of intravenous contrast. CONTRAST:  168m ISOVUE-300 IOPAMIDOL (ISOVUE-300) INJECTION 61% COMPARISON:  None. FINDINGS: Lower chest: Motion artifact.  No consolidation or pleural fluid. Hepatobiliary: No focal liver abnormality is seen. No gallstones, gallbladder wall thickening, or biliary dilatation. Pancreas: No ductal dilatation or inflammation. Spleen: Normal in size without focal abnormality. Adrenals/Urinary Tract: No adrenal nodule. Mild motion artifact through the kidneys limit assessment. Extrarenal pelvis configuration of the left kidney. Prominence of both renal collecting systems. Urinary bladder is distended. No bladder wall thickening. Multiple small cortical low-density lesions in both kidneys are too small to accurately characterize. Stomach/Bowel: Lack of enteric contrast and paucity of intra-abdominal fat limits bowel assessment. The stomach is nondistended. No bowel obstruction. Equivocal wall thickening at the descending/sigmoid colon junction versus more likely nondistention. Normal appendix. No bowel inflammation. Vascular/Lymphatic: Mild aortic atherosclerosis. No aneurysm. No enlarged abdominal or pelvic lymph nodes. Small  calcified mesenteric node in the right lower abdomen. Reproductive: Uterus and bilateral adnexa are unremarkable. Other: No free air, free fluid, or intra-abdominal fluid collection. Musculoskeletal: There are no acute or suspicious osseous abnormalities. IMPRESSION: 1. Equivocal wall thickening of the descending/sigmoid colon junction versus more likely nondistention. 2.  Aortic Atherosclerosis (ICD10-I70.0). 3. Too small to characterize bilateral renal lesions, likely cysts. Electronically Signed   By: MJeb LeveringM.D.   On: 07/24/2017 01:28    Review of Systems  Constitutional: Positive for malaise/fatigue.  HENT:       Neck pain  Eyes: Negative.   Respiratory: Negative.   Cardiovascular: Negative.   Gastrointestinal: Negative.   Genitourinary: Negative.   Musculoskeletal: Positive for back pain and neck pain.  Skin: Negative.   Neurological: Positive for headaches.  Endo/Heme/Allergies: Negative.   Psychiatric/Behavioral: Negative.    Blood pressure 139/71, pulse (!) 105, temperature 99.6 F (37.6 C), resp. rate (!) 24, height 4' (1.219 m), weight 49.9 kg (110 lb 0.2 oz), SpO2 100 %. Physical Exam  Constitutional: She appears well-developed and well-nourished.  HENT:  Head: Normocephalic and atraumatic.  2+ meningismus  Eyes: Conjunctivae and EOM are normal. Pupils are equal, round, and reactive to light.  Neck:  2+ meningismus  GI: Soft. Bowel sounds are normal.  Neurological:  Cranial nerve exam is within the limits of normal. Extraocular movements are full the face is symmetric tongue and uvula protruded in the midline. Upper extremity strength is normal with no evidence of a drift. Lower extremity strength is intact to confrontation. Patient was at bedrest at the current time and ambulation was not tested.  Skin: Skin is warm and dry.  Psychiatric: She has a normal mood and affect. Her behavior is normal. Judgment and thought content normal.     Assessment/Plan: Intraventricular hemorrhage that appears to arise out of the basal ganglia possibly the thalamus. There is no significant intracerebral hemorrhage.  Plan: Observational treatment for the current time. Her blood pressure appears to have been brought under good control. A follow-up MRI will demonstrate whether there is any parenchymal abnormality or possibility of an AVM that is responsible for this hemorrhage though I think the likely cause is hypertension in the itself. 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  Shanai Lartigue J 07/24/2017, 11:06 AM

## 2017-07-24 NOTE — ED Provider Notes (Signed)
[facility] EMERGENCY DEPARTMENT Provider Note   CSN: [id number] Arrival date & time: 07/23/17  2025     History   Chief Complaint Chief Complaint  Patient presents with  . Influenza    HPI Diane Berry is a 57 y.o. female.  Patient brought to emergency department for evaluation of flulike illness.  She comes from home, accompanied by family.  She got sick yesterday but today significantly worsened.  She has had cough, generalized weakness, headache, aches and pains.  She has had right-sided abdominal pain radiating around into her back.  There has been nausea with eating but no vomiting.  She has not had diarrhea.      History reviewed. No pertinent past medical history.  There are no active problems to display for this patient.   History reviewed. No pertinent surgical history.  OB History    No data available       Home Medications    Prior to Admission medications   Medication Sig Start Date End Date Taking? Authorizing Provider  ranitidine (ZANTAC) 150 MG tablet Take 1 tablet (150 mg total) by mouth 2 (two) times daily. 01/31/16   Linna Hoff, MD    Family History History reviewed. No pertinent family history.  Social History Social History   Tobacco Use  . Smoking status: Never Smoker  . Smokeless tobacco: Never Used  Substance Use Topics  . Alcohol use: No    Frequency: Never  . Drug use: No     Allergies   Patient has no known allergies.   Review of Systems Review of Systems  Constitutional: Positive for chills, fatigue and fever.  Respiratory: Positive for cough.   Gastrointestinal: Positive for abdominal pain and nausea.  Genitourinary: Positive for flank pain.  Neurological: Positive for dizziness and headaches.  All other systems reviewed and are negative.    Physical Exam Updated Vital Signs BP (!) 190/158   Pulse (!) 110   Temp 98.1 F (36.7 C) (Oral)   Resp (!) 31   Ht 4' (1.219 m)   Wt 54.4 kg  (120 lb)   SpO2 100%   BMI 36.62 kg/m   Physical Exam  Constitutional: She is oriented to person, place, and time. She appears well-developed and well-nourished. No distress.  HENT:  Head: Normocephalic and atraumatic.  Right Ear: Hearing normal.  Left Ear: Hearing normal.  Nose: Nose normal.  Mouth/Throat: Oropharynx is clear and moist and mucous membranes are normal.  Eyes: Conjunctivae and EOM are normal. Pupils are equal, round, and reactive to light.  Neck: Normal range of motion. Neck supple.  Cardiovascular: Regular rhythm, S1 normal and S2 normal. Exam reveals no gallop and no friction rub.  No murmur heard. Pulmonary/Chest: Effort normal and breath sounds normal. No respiratory distress. She exhibits no tenderness.  Abdominal: Soft. Normal appearance and bowel sounds are normal. There is no hepatosplenomegaly. There is no tenderness. There is no rebound, no guarding, no tenderness at McBurney's point and negative Murphy's sign. No hernia.  Musculoskeletal: Normal range of motion.  Neurological: She is alert and oriented to person, place, and time. She has normal strength. No cranial nerve deficit or sensory deficit. Coordination normal. GCS eye subscore is 4. GCS verbal subscore is 5. GCS motor subscore is 6.  Skin: Skin is warm, dry and intact. No rash noted. No cyanosis.  Psychiatric: She has a normal mood and affect. Her speech is normal and behavior is normal. Thought content normal.  Nursing note and vitals reviewed.    ED Treatments / Results  Labs (all labs ordered are listed, but only abnormal results are displayed) Labs Reviewed  COMPREHENSIVE METABOLIC PANEL - Abnormal; Notable for the following components:      Result Value   CO2 20 (*)    Glucose, Bld 126 (*)    Total Protein 8.4 (*)    All other components within normal limits  CBC WITH DIFFERENTIAL/PLATELET - Abnormal; Notable for the following components:   Hemoglobin 11.7 (*)    MCV 71.8 (*)    MCH  22.9 (*)    All other components within normal limits  URINALYSIS, ROUTINE W REFLEX MICROSCOPIC - Abnormal; Notable for the following components:   Hgb urine dipstick SMALL (*)    Protein, ur >=300 (*)    Squamous Epithelial / LPF 0-5 (*)    All other components within normal limits  I-STAT CG4 LACTIC ACID, ED - Abnormal; Notable for the following components:   Lactic Acid, Venous 3.83 (*)    All other components within normal limits  CULTURE, BLOOD (ROUTINE X 2)  CULTURE, BLOOD (ROUTINE X 2)  URINE CULTURE  INFLUENZA PANEL BY PCR (TYPE A & B)  I-STAT BETA HCG BLOOD, ED ([facility], [facility], [facility] ONLY)  I-STAT CG4 LACTIC ACID, ED  I-STAT CG4 LACTIC ACID, ED    EKG  EKG Interpretation  Date/Time:  Friday July 23 2017 20:49:54 EST Ventricular Rate:  94 PR Interval:  130 QRS Duration: 90 QT Interval:  380 QTC Calculation: 475 R Axis:   4 Text Interpretation:  Normal sinus rhythm RSR' or QR pattern in V1 suggests right ventricular conduction delay Borderline ECG No previous tracing Confirmed by Gilda Crease [phone]) on 07/23/2017 11:58:23 PM       Radiology Dg Chest 2 View  Result Date: 07/23/2017 CLINICAL DATA:  Cough, shortness of breath, fever, chills, and nausea. Former smoker. EXAM: CHEST  2 VIEW COMPARISON:  None. FINDINGS: The cardiac silhouette is borderline to mildly enlarged. Aortic atherosclerosis is noted. There is anterior eventration of the right hemidiaphragm. No confluent airspace opacity, edema, pleural effusion, or pneumothorax is identified. No acute osseous abnormality is seen. IMPRESSION: No active cardiopulmonary disease. Electronically Signed   By: Sebastian Ache M.D.   On: 07/23/2017 21:13   Ct Abdomen Pelvis W Contrast  Result Date: 07/24/2017 CLINICAL DATA:  Acute abdominal pain.  Nausea and vomiting. EXAM: CT ABDOMEN AND PELVIS WITH CONTRAST TECHNIQUE: Multidetector CT imaging of the abdomen and pelvis was performed using the standard protocol following  bolus administration of intravenous contrast. CONTRAST:  ISOVUE-300 IOPAMIDOL (ISOVUE-300) INJECTION 61% COMPARISON:  None. FINDINGS: Lower chest: Motion artifact.  No consolidation or pleural fluid. Hepatobiliary: No focal liver abnormality is seen. No gallstones, gallbladder wall thickening, or biliary dilatation. Pancreas: No ductal dilatation or inflammation. Spleen: Normal in size without focal abnormality. Adrenals/Urinary Tract: No adrenal nodule. Mild motion artifact through the kidneys limit assessment. Extrarenal pelvis configuration of the left kidney. Prominence of both renal collecting systems. Urinary bladder is distended. No bladder wall thickening. Multiple small cortical low-density lesions in both kidneys are too small to accurately characterize. Stomach/Bowel: Lack of enteric contrast and paucity of intra-abdominal fat limits bowel assessment. The stomach is nondistended. No bowel obstruction. Equivocal wall thickening at the descending/sigmoid colon junction versus more likely nondistention. Normal appendix. No bowel inflammation. Vascular/Lymphatic: Mild aortic atherosclerosis. No aneurysm. No enlarged abdominal or pelvic lymph nodes. Small calcified mesenteric node in the right  lower abdomen. Reproductive: Uterus and bilateral adnexa are unremarkable. Other: No free air, free fluid, or intra-abdominal fluid collection. Musculoskeletal: There are no acute or suspicious osseous abnormalities. IMPRESSION: 1. Equivocal wall thickening of the descending/sigmoid colon junction versus more likely nondistention. 2.  Aortic Atherosclerosis (ICD10-I70.0). 3. Too small to characterize bilateral renal lesions, likely cysts. Electronically Signed   By: Rubye OaksMelanie  Ehinger M.D.   On: 07/24/2017 01:28    Procedures Procedures (including critical care time)  Medications Ordered in ED Medications  vancomycin (VANCOCIN) IVPB 1000 mg/200 mL premix (1,000 mg Intravenous New Bag/Given 07/24/17 0121)    labetalol (NORMODYNE,TRANDATE) injection 5 mg (not administered)  sodium chloride 0.9 % bolus 1,000 mL (1,000 mLs Intravenous New Bag/Given 07/24/17 0024)    And  sodium chloride 0.9 % bolus 500 mL (0 mLs Intravenous Stopped 07/24/17 0124)    And  sodium chloride 0.9 % bolus 250 mL (250 mLs Intravenous New Bag/Given 07/24/17 0125)  piperacillin-tazobactam (ZOSYN) IVPB 3.375 g (0 g Intravenous Stopped 07/24/17 0124)  oseltamivir (TAMIFLU) capsule 75 mg (75 mg Oral Given 07/24/17 0121)  iopamidol (ISOVUE-300) 61 % injection (100 mLs  Contrast Given 07/24/17 0102)     Initial Impression / Assessment and Plan / ED Course  I have reviewed the triage vital signs and the nursing notes.  Pertinent labs & imaging results that were available during my care of the patient were reviewed by me and considered in my medical decision making (see chart for details).     Patient presents to the emergency department for evaluation of flulike illness.  Symptoms began yesterday, worsened today.  Patient extremely weak, has not been eating or drinking.  She has had cough and chest congestion.  Patient is not febrile here in the ER, but has been febrile at home and daughter has been giving ibuprofen and Tylenol around-the-clock for this.  Initial troponin was 3.83, patient brought back to the ER once this was discovered and initiated an sepsis pathway.  Source is unclear at this time.  She does not have a significant leukocytosis.  Chest x-ray does not suggest pneumonia.  She has been complaining of lower abdominal pain and back pain.  CT abdomen and pelvis performed, no acute pathology noted although there might be some slight thickening of the colon, cannot rule out infectious etiology.  Urinalysis also equivocal with white blood cells but no bacteria seen.  The blood and urine cultures pending.  Patient empirically treated with 30 mL/kg IV fluids, Zosyn and vancomycin.  Repeat lactic acid is improved after fluids.   Patient was hypertensive at arrival.  I did not initially treat this because I was concerned about the possibility of hypotension developing secondary to sepsis.  She has been persistently elevated and will be gently treated.  Final Clinical Impressions(s) / ED Diagnoses   Final diagnoses:  Flu-like symptoms  Lactic acidosis    ED Discharge Orders    None       Pinchos Topel, Canary Brimhristopher J, MD 07/24/17 [phone]

## 2017-07-24 NOTE — ED Notes (Signed)
Pt. Return from CT via stretcher. 

## 2017-07-24 NOTE — Consult Note (Signed)
Requesting Physician: Dr. Toniann FailKakrakandy    Chief Complaint: Headache  History obtained from: Patient and Chart     HPI:                                                                                                                                       Diane Berry is an 57 y.o. female [place] who presents with 3 day history of headache, neck pain, generalized body ache x 3 days. Patient also has been having multiple complaints including abdominal discomfort. BP is 190 systolic on arrival. Patient admitted to medicine service from ER for SIRS workup and started on broad spectrum antibiotics.   After admission and assessment by hospitalist MD, a stat CT Head was ordered which showed right lateral Intraventricular hemorrhage. Patient rceived IV hydralazine and blood pressure was brought below 140 systolic. Neurology was consulted immediately.   On assessment at 8 am patient alert, no evidence of weakness. Complaing of severe headache and neck stiffness. Slightly confused per daughter. Patient's daughter provided [place] translation.   Date last known well: 3 days ago  tPA Given: no, ICH NIHSS: 0 Baseline MRS 0  Intracerebral Hemorrhage (ICH) Score  Glascow Coma Score  13-15 0  Age >/= no 0  ICH volume >/= 30ml  no 0  IVH yes +1  Infratentorial origin no 0 Total:  1   History reviewed. No pertinent past medical history.  History reviewed. No pertinent surgical history.  Family History  Problem Relation Age of Onset  . Hypertension Sister    Social History:  reports that  has never smoked. she has never used smokeless tobacco. She reports that she does not drink alcohol or use drugs.  Allergies: No Known Allergies  Medications:                                                                                                                        I reviewed home medications   ROS:  14 systems reviewed and negative except above    Examination:                                                                                                      General: Appears well-developed and well-nourished. Appears in distress.  Psych: Affect appropriate to situation Eyes: No scleral injection HENT: No OP obstrucion, nuchal rigidity Head: Normocephalic.  Cardiovascular: Normal rate and regular rhythm.  Respiratory: Effort normal and breath sounds normal to anterior ascultation GI: Soft.  No distension. There is no tenderness.  Skin: WDI   Neurological Examination Mental Status: Alert, slightly confused.Speech fluent without evidence of aphasia. Able to follow 3 step commands without difficulty. ( limited assessment due to [place] language)  Cranial Nerves: II: Visual fields grossly normal,  III,IV, VI: ptosis not present, extra-ocular motions intact bilaterally, pupils equal, round, reactive to light and accommodation V,VII: smile symmetric, facial light touch sensation normal bilaterally VIII: hearing normal bilaterally IX,X: uvula rises symmetrically XI: bilateral shoulder shrug XII: midline tongue extension Motor: Right : Upper extremity   5/5    Left:     Upper extremity   5/5  Lower extremity   5/5     Lower extremity   5/5 Tone and bulk:normal tone throughout; no atrophy noted Sensory: Pinprick and light touch intact throughout, bilaterally Deep Tendon Reflexes: 2+ and symmetric throughout Plantars: Right: downgoing   Left: downgoing Cerebellar: normal finger-to-nose, normal rapid alternating movements and normal heel-to-shin test Gait: normal gait and station     Lab Results: Basic Metabolic Panel: Recent Labs  Lab 07/23/17 2040 07/24/17 0415  NA 137 137  K 3.6 3.4*  CL 106 105  CO2 20* 19*  GLUCOSE 126* 133*  BUN 11 9  CREATININE 0.76 0.77  CALCIUM 9.7 8.9    CBC: Recent Labs  Lab 07/23/17 2040  07/24/17 0415  WBC 7.0 7.6  NEUTROABS 4.4 6.4  HGB 11.7* 11.0*  HCT 36.6 35.0*  MCV 71.8* 72.0*  PLT 232 208    Coagulation Studies: No results for input(s): LABPROT, INR in the last 72 hours.  Imaging: Dg Chest 2 View  Result Date: 07/23/2017 CLINICAL DATA:  Cough, shortness of breath, fever, chills, and nausea. Former smoker. EXAM: CHEST  2 VIEW COMPARISON:  None. FINDINGS: The cardiac silhouette is borderline to mildly enlarged. Aortic atherosclerosis is noted. There is anterior eventration of the right hemidiaphragm. No confluent airspace opacity, edema, pleural effusion, or pneumothorax is identified. No acute osseous abnormality is seen. IMPRESSION: No active cardiopulmonary disease. Electronically Signed   By: Sebastian Ache M.D.   On: 07/23/2017 21:13   Ct Head Wo Contrast  Result Date: 07/24/2017 CLINICAL DATA:  Headache, hypertension EXAM: CT HEAD WITHOUT CONTRAST TECHNIQUE: Contiguous axial images were obtained from the base of the skull through the vertex without intravenous contrast. COMPARISON:  None. FINDINGS: Brain: Acute hemorrhage in the right lateral ventricle. Hematoma volume 4.5 mL. No other acute hemorrhage. Negative for hydrocephalus. 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 Chronic microvascular ischemic change in the white matter. 8 mm coarse calcification  in the left frontal lobe appears benign. Negative for acute infarct.  No mass or edema. Vascular: Negative for hyperdense vessel Skull: Prominent vascular markings throughout the skull Sinuses/Orbits: Chronic mucosal edema and bony thickening of the maxillary sinus bilaterally. Negative orbit. Other: None IMPRESSION: Acute hemorrhage in the right lateral ventricle. Hematoma volume 4.5 mL. Negative for hydrocephalus. No underlying mass identified. CTA head recommended to evaluate for vascular malformation. Intracranial atherosclerotic disease with chronic microvascular ischemia. No acute ischemic infarction. These  results were called by telephone at the time of interpretation on 07/24/2017 at 6:58 am to Dr. Midge Minium , who verbally acknowledged these results. Electronically Signed   By: Marlan Palau M.D.   On: 07/24/2017 06:58   Ct Abdomen Pelvis W Contrast  Result Date: 07/24/2017 CLINICAL DATA:  Acute abdominal pain.  Nausea and vomiting. EXAM: CT ABDOMEN AND PELVIS WITH CONTRAST TECHNIQUE: Multidetector CT imaging of the abdomen and pelvis was performed using the standard protocol following bolus administration of intravenous contrast. CONTRAST:  ISOVUE-300 IOPAMIDOL (ISOVUE-300) INJECTION 61% COMPARISON:  None. FINDINGS: Lower chest: Motion artifact.  No consolidation or pleural fluid. Hepatobiliary: No focal liver abnormality is seen. No gallstones, gallbladder wall thickening, or biliary dilatation. Pancreas: No ductal dilatation or inflammation. Spleen: Normal in size without focal abnormality. Adrenals/Urinary Tract: No adrenal nodule. Mild motion artifact through the kidneys limit assessment. Extrarenal pelvis configuration of the left kidney. Prominence of both renal collecting systems. Urinary bladder is distended. No bladder wall thickening. Multiple small cortical low-density lesions in both kidneys are too small to accurately characterize. Stomach/Bowel: Lack of enteric contrast and paucity of intra-abdominal fat limits bowel assessment. The stomach is nondistended. No bowel obstruction. Equivocal wall thickening at the descending/sigmoid colon junction versus more likely nondistention. Normal appendix. No bowel inflammation. Vascular/Lymphatic: Mild aortic atherosclerosis. No aneurysm. No enlarged abdominal or pelvic lymph nodes. Small calcified mesenteric node in the right lower abdomen. Reproductive: Uterus and bilateral adnexa are unremarkable. Other: No free air, free fluid, or intra-abdominal fluid collection. Musculoskeletal: There are no acute or suspicious osseous abnormalities.  IMPRESSION: 1. Equivocal wall thickening of the descending/sigmoid colon junction versus more likely nondistention. 2.  Aortic Atherosclerosis (ICD10-I70.0). 3. Too small to characterize bilateral renal lesions, likely cysts. Electronically Signed   By: Rubye Oaks M.D.   On: 07/24/2017 01:28     ASSESSMENT AND PLAN  57 y.o. female [place] who presents with 3 day history of headache, neck pain, generalized body ache x 3 days. CT Head showed right lateral ventricle. Hematoma volume 4.59mL. Negative for hydrocephalus.  Intraventricular hemorrhage  Plan Transfer to Neuro ICU Nicardipine drip for blood pressure control, goal less than 140/90 Stat CTA head to look for vascular abnormality MRI Brain w/w contrast to look for intraventricular tumors, if negative will MRI C spine  Neurosurgery consult stat Monitor for hydrocephalus    This patient is neurologically critically ill due to Intraventricular hemorrhage.  He is at risk for significant risk of neurological worsening from cerebral edema,  death from brain herniation, infection, respiratory failure and seizure. This patient's care requires constant monitoring of vital signs, hemodynamics, respiratory and cardiac monitoring, review of multiple databases, neurological assessment, discussion with family, other specialists and medical decision making of high complexity.  I spent 50  minutes of neurocritical time in the care of this patient.     Diane Berry Pager Number [phone]

## 2017-07-24 NOTE — Progress Notes (Signed)
Pt speaks no [place]. Primary language Elissa LovettMontagnard Jarai. Pt's daughter at bedside and used as Nurse, learning disabilitytranslator as no resource available for pt's primary language. Admission complete by pt, with daughter as Nurse, learning disabilitytranslator.

## 2017-07-24 NOTE — Progress Notes (Signed)
Notified by MD of pt's CT results. Orders given for PRN hydralazine placed with a goal of SBP >140 until Neuro consulted. IVMF stopped and Will give PRN hydralazine as ordered. Dayshift RN updated.

## 2017-07-24 NOTE — ED Notes (Signed)
Pt. To CT via stretcher. 

## 2017-07-24 NOTE — Plan of Care (Signed)
Pt tolerating PO meds

## 2017-07-24 NOTE — Significant Event (Signed)
Rapid Response Event Note  Overview: Time Called: 0812 Arrival Time: 0816 Event Type: Neurologic  Initial Focused Assessment/Interventions: Patient admitted with abd pain and SIRS.  Also CO HA.   CT head done after admit to floor showed ICH.  Dr Aroor at bedside assessing patient when I arrived.   Patient non english speaking,  Daughter is translating. NIHSS 0, continues to complain of HA  Cardene started and titrated to maintain SBP < 140 CTA head and neck done  Patient transported to 4N18    Plan of Care (if not transferred):  Event Summary: Name of Physician Notified: Toniann FailKakrakandy at [phone]  Name of Consulting Physician Notified: Aroor at bedside to assess patient upon my arrival at    Outcome: Transferred (Comment)  Event End Time: 0940  Marcellina MillinLayton, Caterin Tabares

## 2017-07-24 NOTE — Progress Notes (Signed)
PROGRESS NOTE    Diane Berry  ZOX:[id number] DOB: 08/17/1960 DOA: 07/23/2017 PCP: System, Pcp Not In    Brief Narrative:  57 year old female who presents generalized body aches and severe headache.  She does have a significant past medical history of hypertension.  She had symptoms for 2 days prior to hospitalization, associated abdominal pain.  On the initial physical examination blood pressure 190/158, heart rate 110, respiratory rate 31, temperature 99.8, oxygen saturation 100%.  She was awake and alert, nonfocal, her lungs were clear to auscultation bilaterally, heart S1-S2 present rhythmic, her abdomen was soft nontender, no lower extremity edema.  Sodium 137, potassium 3.6, chloride 106, bicarb 20, glucose 126, BUN 11, creatinine 0.76, white count 7.0, hemoglobin 11.7, hematocrit 36.6, platelets 232.  TSH is 0.26, free T4 0.95, influenza negative, urinalysis greater than 30 protein, specific gravity is 1.024, white cells 6-30.  CT of the abdomen with wall thickening of the descending/sigmoid colon.  Head CT with acute hemorrhage in the right lateral ventricle.  Hematoma volume 4.5 mL, negative for hydrocephalus.  Head CT angiography no vascular malformation.  Chest x-ray with right hemidiaphragm elevation, no infiltrates.  EKG, sinus rhythm with 94 bpm, normal axis, normal intervals.   Patient was admitted with the working diagnosis of hypertensive emergency complicated by intracranial bleed.    Assessment & Plan:   Principal Problem:   SIRS (systemic inflammatory response syndrome) (HCC) Active Problems:   Elevated blood pressure reading   ICH (intracerebral hemorrhage) (HCC)   1.  Hypertensive emergency. Patient tolerating well nicardipine infusion, systolic has been below 150 since this am. Will transition to oral antihypertensive agents with lisinopril and HCTZ, will target blood pressure systolic less than 140. Will continue neuro checks. Continue as needed IV  hydralazine.   2.   Intracranial bleed. Likely due to hypertensive emergency, will follow on MRI, evaluate the presence of AVM. Will continue neurologic monitoring.   3.  Chronic anemia. Stable hb and hct, iron panel with transferrin saturation 16, with iron 42 and tibc 265.   4. Sepsis ruled out. No signs of systemic infection, will discontinue IV antibiotic therapy, will continue to follow cell count and temperature curve. Hold on IV fluids, follow a restrictive IV fluids strategy.    DVT prophylaxis: scd  Code Status:  full Family Communication:  Disposition Plan:    Consultants:   Neurology   Neurosurgery  Procedures:     Antimicrobials:       Subjective: Patient complains of headache, at the occipital region, moderate in intensity, worse with movement, improved with tylenol, radiated to the neck. No nausea or vomiting, no abdominal pain.   Objective: Vitals:   07/24/17 1215 07/24/17 1230 07/24/17 1245 07/24/17 1325  BP: 127/63 131/66 138/62 135/70  Pulse:    (!) 103  Resp:    (!) 34  Temp:      TempSrc:      SpO2: 97% 97% 100% 97%  Weight:      Height:        Intake/Output Summary (Last 24 hours) at 07/24/2017 1346 Last data filed at 07/24/2017 1117 Gross per 24 hour  Intake 2266.26 ml  Output -  Net 2266.26 ml   Filed Weights   07/23/17 2032 07/24/17 0458  Weight: 54.4 kg (120 lb) 49.9 kg (110 lb 0.2 oz)    Examination:   General: Not in pain or dyspnea, deconditioned Neurology: Awake and alert, non focal  E ENT: no pallor, no icterus, oral mucosa  moist Cardiovascular: No JVD. S1-S2 present, rhythmic, no gallops, rubs, or murmurs. No lower extremity edema. Pulmonary: vesicular breath sounds bilaterally, adequate air movement, no wheezing, rhonchi or rales. Gastrointestinal. Abdomen flat, no organomegaly, non tender, no rebound or guarding Skin. No rashes Musculoskeletal: no joint deformities     Data Reviewed: I have personally reviewed following labs and imaging  studies  CBC: Recent Labs  Lab 07/23/17 2040 07/24/17 0415  WBC 7.0 7.6  NEUTROABS 4.4 6.4  HGB 11.7* 11.0*  HCT 36.6 35.0*  MCV 71.8* 72.0*  PLT 232 208   Basic Metabolic Panel: Recent Labs  Lab 07/23/17 2040 07/24/17 0415  NA 137 137  K 3.6 3.4*  CL 106 105  CO2 20* 19*  GLUCOSE 126* 133*  BUN 11 9  CREATININE 0.76 0.77  CALCIUM 9.7 8.9   GFR: Estimated Creatinine Clearance: 36.7 mL/min (by C-G formula based on SCr of 0.77 mg/dL). Liver Function Tests: Recent Labs  Lab 07/23/17 2040 07/24/17 0415  AST 41 37  ALT 16 16  ALKPHOS 96 87  BILITOT 1.2 1.2  PROT 8.4* 7.9  ALBUMIN 4.5 4.1   No results for input(s): LIPASE, AMYLASE in the last 168 hours. No results for input(s): AMMONIA in the last 168 hours. Coagulation Profile: Recent Labs  Lab 07/24/17 0924  INR 0.99   Cardiac Enzymes: Recent Labs  Lab 07/24/17 0659  TROPONINI 0.03*   BNP (last 3 results) No results for input(s): PROBNP in the last 8760 hours. HbA1C: No results for input(s): HGBA1C in the last 72 hours. CBG: No results for input(s): GLUCAP in the last 168 hours. Lipid Profile: No results for input(s): CHOL, HDL, LDLCALC, TRIG, CHOLHDL, LDLDIRECT in the last 72 hours. Thyroid Function Tests: Recent Labs    07/24/17 0419 07/24/17 0659  TSH 0.260*  --   FREET4  --  0.95   Anemia Panel: Recent Labs    07/24/17 0659  VITAMINB12 1,254*  FOLATE 23.6  FERRITIN 188  TIBC 265  IRON 42  RETICCTPCT 2.5      Radiology Studies: I have reviewed all of the imaging during this hospital visit personally     Scheduled Meds: . [START ON 07/25/2017] diphenhydrAMINE  25 mg Intravenous Q0600  . [START ON 07/25/2017] Influenza vac split quadrivalent PF  0.5 mL Intramuscular Tomorrow-1000  . labetalol  20 mg Intravenous Once  . pantoprazole (PROTONIX) IV  40 mg Intravenous QHS  . senna-docusate  1 tablet Oral BID   Continuous Infusions: . sodium chloride Stopped (07/24/17 1105)  .  niCARDipine Stopped (07/24/17 1117)  . piperacillin-tazobactam (ZOSYN)  IV 3.375 g (07/24/17 1344)  . [START ON 07/25/2017] vancomycin       LOS: 0 days        Delvecchio Madole Annett Gulaaniel Aryan Bello, MD [facility] Pager [phone]

## 2017-07-24 NOTE — Progress Notes (Signed)
Neurology MD at bedside. CT Angio ordered. Rapid response nurse called. Orders to transfer pt to neuro ICU. Report called to RN Tammy and pt transferred to CT and then neuro ICU with help of rapid response nurse.

## 2017-07-24 NOTE — Progress Notes (Signed)
Pharmacy Antibiotic Note  Diane Berry is a 89Daryl [address]7 y.o. female admitted on 07/23/2017 with sepsis.  Pharmacy has been consulted for Vancomycin/Zosyn dosing. WBC WNL. Renal function good. Lactic acid trending down. Tmax 99.8.   Plan: -Vancomycin 750 mg IV q24h, infuse and 1/2 rate and pre-medicate with benadryl due to "red-man" reaction -Zosyn 3.375G IV q8h to be infused over 4 hours -Trend WBC, temp, renal function  -F/U infectious work-up -Drug levels as indicated   Height: 4' (121.9 cm) Weight: 110 lb 0.2 oz (49.9 kg) IBW/kg (Calculated) : 17.9  Temp (24hrs), Avg:99.1 F (37.3 C), Min:98.1 F (36.7 C), Max:99.8 F (37.7 C)  Recent Labs  Lab 07/23/17 2040 07/23/17 2307 07/24/17 0031 07/24/17 0415  WBC 7.0  --   --  7.6  CREATININE 0.76  --   --  0.77  LATICACIDVEN  --  3.83* 1.43  --     Estimated Creatinine Clearance: 36.7 mL/min (by C-G formula based on SCr of 0.77 mg/dL).    No Known Allergies   Abran DukeLedford, Jolena Kittle 07/24/2017 6:35 AM

## 2017-07-25 DIAGNOSIS — I611 Nontraumatic intracerebral hemorrhage in hemisphere, cortical: Secondary | ICD-10-CM

## 2017-07-25 LAB — GLUCOSE, CAPILLARY
Glucose-Capillary: 102 mg/dL — ABNORMAL HIGH (ref 65–99)
Glucose-Capillary: 122 mg/dL — ABNORMAL HIGH (ref 65–99)
Glucose-Capillary: 119 mg/dL — ABNORMAL HIGH (ref 65–99)

## 2017-07-25 LAB — CBC WITH DIFFERENTIAL/PLATELET
BASOS PCT: 0 %
Basophils Absolute: 0 10*3/uL (ref 0.0–0.1)
EOS PCT: 0 %
Eosinophils Absolute: 0 10*3/uL (ref 0.0–0.7)
HCT: 34.2 % — ABNORMAL LOW (ref 36.0–46.0)
Hemoglobin: 10.9 g/dL — ABNORMAL LOW (ref 12.0–15.0)
LYMPHS ABS: 1.9 10*3/uL (ref 0.7–4.0)
Lymphocytes Relative: 19 %
MCH: 22.6 pg — AB (ref 26.0–34.0)
MCHC: 31.9 g/dL (ref 30.0–36.0)
MCV: 70.8 fL — AB (ref 78.0–100.0)
Monocytes Absolute: 0.4 10*3/uL (ref 0.1–1.0)
Monocytes Relative: 4 %
Neutro Abs: 7.6 10*3/uL (ref 1.7–7.7)
Neutrophils Relative %: 77 %
PLATELETS: 200 10*3/uL (ref 150–400)
RBC: 4.83 MIL/uL (ref 3.87–5.11)
RDW: 14.9 % (ref 11.5–15.5)
WBC: 9.9 10*3/uL (ref 4.0–10.5)

## 2017-07-25 LAB — URINE CULTURE: CULTURE: NO GROWTH

## 2017-07-25 LAB — BASIC METABOLIC PANEL
Anion gap: 12 (ref 5–15)
BUN: 20 mg/dL (ref 6–20)
CO2: 19 mmol/L — ABNORMAL LOW (ref 22–32)
Calcium: 9.2 mg/dL (ref 8.9–10.3)
Chloride: 104 mmol/L (ref 101–111)
Creatinine, Ser: 0.88 mg/dL (ref 0.44–1.00)
GFR calc Af Amer: >60 mL/min (ref >60)
GLUCOSE: 126 mg/dL — AB (ref 65–99)
POTASSIUM: 3.4 mmol/L — AB (ref 3.5–5.1)
Sodium: 135 mmol/L (ref 135–145)

## 2017-07-25 MED ORDER — PROCHLORPERAZINE MALEATE 5 MG PO TABS
5.0000 mg | ORAL_TABLET | Freq: Four times a day (QID) | ORAL | Status: DC | PRN
  Administered 2017-07-25 – 2017-07-26 (×3): 5 mg via ORAL
  Filled 2017-07-25 (×4): qty 1

## 2017-07-25 MED ORDER — BUTALBITAL-APAP-CAFFEINE 50-325-40 MG PO TABS
1.0000 | ORAL_TABLET | Freq: Four times a day (QID) | ORAL | Status: DC | PRN
  Administered 2017-07-25 – 2017-07-26 (×3): 1 via ORAL
  Filled 2017-07-25 (×3): qty 1

## 2017-07-25 MED ORDER — POTASSIUM CHLORIDE CRYS ER 20 MEQ PO TBCR
20.0000 meq | EXTENDED_RELEASE_TABLET | Freq: Two times a day (BID) | ORAL | Status: AC
  Administered 2017-07-25 – 2017-07-26 (×4): 20 meq via ORAL
  Filled 2017-07-25 (×4): qty 1

## 2017-07-25 MED ORDER — PANTOPRAZOLE SODIUM 40 MG PO TBEC
40.0000 mg | DELAYED_RELEASE_TABLET | Freq: Every day | ORAL | Status: DC
  Administered 2017-07-25 – 2017-07-27 (×3): 40 mg via ORAL
  Filled 2017-07-25 (×3): qty 1

## 2017-07-25 NOTE — Progress Notes (Signed)
New Admission Note:  Arrival Method:on bed from ICU  Mental Orientation: alert & oriented x4; translated by daughter  Telemetry:3w05 Assessment: Completed IV: R wrist & R AC Pain: severe headache; pt given medication  Safety Measures: Safety Fall Prevention Plan was given, discussed. Admission: Completed 3W24: Patient has been orientated to the room, unit and the staff. Family: at bedside   Orders have been reviewed and implemented. Will continue to monitor the patient. Call light has been placed within reach and bed alarm has been activated.   Lawernce IonYari Tayshaun Kroh ,RN

## 2017-07-25 NOTE — Progress Notes (Signed)
STROKE TEAM PROGRESS NOTE   HISTORY OF PRESENT ILLNESS (per record) Diane Berry is a 57 y.o. female [place] who presents with 3 day history of headache, neck pain, and generalized body aches. Patient also has been having multiple complaints including abdominal discomfort. BP is 190 systolic on arrival. Patient admitted to medicine service from ER for SIRS workup and started on broad spectrum antibiotics.   After admission and assessment by hospitalist MD, a stat CT Head was ordered which showed right lateral Intraventricular hemorrhage. Patient rceived IV hydralazine and blood pressure was brought below 140 systolic. Neurology was consulted immediately.   On assessment at 8 am patient alert, no evidence of weakness. Complaing of severe headache and neck stiffness. Slightly confused per daughter. Patient's daughter provided [place] translation.   Date last known well: 3 days ago  tPA Given: no, ICH NIHSS: 0 Baseline MRS 0     SUBJECTIVE (INTERVAL HISTORY) Her daughter is at bedside. She continues ot have mild headache and neck pain but otherwise neurologically non focal.     OBJECTIVE Temp:  [98.6 F (37 C)-101.5 F (38.6 C)] 99.3 F (37.4 C) (02/17 1112) Pulse Rate:  [78-107] 94 (02/17 1300) Cardiac Rhythm: Normal sinus rhythm (02/17 0800) Resp:  [16-27] 21 (02/17 1300) BP: (84-150)/(56-107) 134/72 (02/17 1300) SpO2:  [95 %-100 %] 99 % (02/17 1300)  CBC:  Recent Labs  Lab 07/24/17 0415 07/25/17 0224  WBC 7.6 9.9  NEUTROABS 6.4 7.6  HGB 11.0* 10.9*  HCT 35.0* 34.2*  MCV 72.0* 70.8*  PLT 208 200    Basic Metabolic Panel:  Recent Labs  Lab 07/24/17 0415 07/25/17 0224  NA 137 135  K 3.4* 3.4*  CL 105 104  CO2 19* 19*  GLUCOSE 133* 126*  BUN 9 20  CREATININE 0.77 0.88  CALCIUM 8.9 9.2    Lipid Panel: No results found for: CHOL, TRIG, HDL, CHOLHDL, VLDL, LDLCALC HgbA1c:  Lab Results  Component Value Date   HGBA1C 5.1 07/24/2017   Urine Drug  Screen: No results found for: LABOPIA, COCAINSCRNUR, LABBENZ, AMPHETMU, THCU, LABBARB  Alcohol Level No results found for: ETH  IMAGING   Ct Angio Head W Or Wo Contrast Ct Angio Neck W Or Wo Contrast 07/24/2017 IMPRESSION:  Intraventricular hemorrhage right lateral ventricle unchanged.  No underlying vascular malformation. Hemorrhage is presumably hypertensive.  Severe intracranial atherosclerotic disease. Moderate calcific stenosis in the cavernous carotid bilaterally.  Severe calcific stenosis distal vertebral artery bilaterally.  Extensive atherosclerotic disease throughout the anterior, middle, and posterior cerebral arteries without large vessel occlusion.  Calcific stenosis in the carotid bifurcation bilaterally. No significant stenosis right carotid.  Less than 25% diameter stenosis left internal carotid artery.   Dg Chest 2 View 07/23/2017 IMPRESSION:  No active cardiopulmonary disease.    Ct Head Wo Contrast 07/24/2017 IMPRESSION:  Unchanged right lateral intraventricular hemorrhage.  No hydrocephalus or new intracranial abnormality.    Ct Head Wo Contrast 07/24/2017 IMPRESSION:  Acute hemorrhage in the right lateral ventricle. Hematoma volume 4.5 mL. Negative for hydrocephalus. No underlying mass identified. CTA head recommended to evaluate for vascular malformation. Intracranial atherosclerotic disease with chronic microvascular ischemia. No acute ischemic infarction.     Mr Laqueta Jean Wo Contrast Mr Cervical Spine W Wo Contrast 07/24/2017 IMPRESSION:  1. Unchanged right lateral intraventricular hemorrhage. Small volume blood products in the left lateral and fourth ventricles. No hydrocephalus.  2. No acute infarct or mass identified.  3. Markedly age advanced chronic small vessel ischemic disease with  multiple chronic lacunar infarcts as above. 4. Numerous chronic microhemorrhages favoring chronic hypertension.    Mr Cervical Spine W Wo Contrast 07/24/2017 Motion  degraded cervical spine MRI. C5-6 disc degeneration with mild-to-moderate spinal stenosis. Tiny C6-7 disc protrusion without stenosis.  Ct Abdomen Pelvis W Contrast 07/24/2017 IMPRESSION:  1. Equivocal wall thickening of the descending/sigmoid colon junction versus more likely nondistention.  2.  Aortic Atherosclerosis (ICD10-I70.0).  3. Too small to characterize bilateral renal lesions, likely cysts.   US Renal 07/24/2017 IMPRESSION:  Mild ectasia of the renal calices bilaterally. No obstructive uropathy.  No sonographic findings to explain the patient's proteinuria.         PHYSICAL EXAM Vitals:   07/25/17 1100 07/25/17 1112 07/25/17 1202 07/25/17 1300  BP: (!) 143/72  134/71 134/72  Pulse: 86  99 94  Resp: 19  (!) 27 (!) 21  Temp:  99.3 F (37.4 C)    TempSrc:  Oral    SpO2: 98%  97% 99%  Weight:      Height:        PHYSICAL EXAM Physical exam: Exam: Gen: NAD Eyes: anicteric sclerae, moist conjunctivae                    CV: no MRG, no carotid bruits, no peripheral edema Mental Status: Alert, follows commands, good historian  Neuro: Detailed Neurologic Exam  Speech:    No aphasia, no dysarthria  Cranial Nerves:    The pupils are equal, round, and reactive to light.. Attempted, Fundi not visualized.  EOMI. No gaze preference. Visual fields full. Face symmetric, Tongue midline. Hearing intact to voice. Shoulder shrug intact  Motor Observation:    no involuntary movements noted. Tone appears normal.     Strength:    5/5     Sensation:  Intact to LT  Plantars equiv.       HOME MEDICATIONS:  Medications Prior to Admission  Medication Sig Dispense Refill  . acetaminophen (TYLENOL) 500 MG tablet Take 500 mg by mouth every 6 (six) hours as needed for mild pain, fever or headache.    . bismuth subsalicylate (PEPTO BISMOL) 262 MG/15ML suspension Take 30 mLs by mouth every 6 (six) hours as needed for indigestion.    Marland Kitchen ibuprofen (ADVIL,MOTRIN) 200 MG  tablet Take 200 mg by mouth every 6 (six) hours as needed for fever or headache.        HOSPITAL MEDICATIONS:  . diphenhydrAMINE  25 mg Intravenous Q0600  . hydrochlorothiazide  25 mg Oral Daily  . labetalol  20 mg Intravenous Once  . lisinopril  10 mg Oral Daily  . pantoprazole  40 mg Oral QHS  . senna-docusate  1 tablet Oral BID     ASSESSMENT/PLAN Diane Berry is a 57 y.o. female with no significant past medical history presenting with a 3 day history of headache, neck pain, abdominal pain, and generalized body aches. She did not receive IV t-PA due to ICH.   ICH:  Felt secondary to uncontrolled hypertension.   Resultant  Non focal exam, patient has headache  CT head - Acute hemorrhage in the right lateral ventricle. Hematoma volume 4.5 mL.  MRI head - Unchanged right lateral intraventricular hemorrhage. Multiple chronic lacunar infarcts.  MRA head - diffuse cerebrovascular disease.  MRI C spine - mild-to-moderate spinal stenosis. Tiny C6-7 disc protrusion without stenosis.  CTA H&N - Severe intracranial atherosclerotic disease.   Carotid Doppler - CTA neck  2D Echo - - not  performed  LDL - will order  HgbA1c - 5.1  VTE prophylaxis - SCDs Fall precautions Diet Heart Room service appropriate? Yes; Fluid consistency: Thin  No antithrombotic prior to admission, now on No antithrombotic  Ongoing aggressive stroke risk factor management  Therapy recommendations:  pending  Disposition:  Pending  Hypertension  Stable  Permissive hypertension (OK if < 220/120) but gradually normalize in 5-7 days  Long-term BP goal normotensive  Hyperlipidemia  Home meds: No lipid lowering medications prior to admission  LDL pending, goal < 70    Other Stroke Risk Factors  Obesity, Body mass index is 33.57 kg/m., recommend weight loss, diet and exercise as appropriate   Previous strokes by imaging   Other Active Problems  Severe intracranial atherosclerotic  disease.  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 Tiny C6-7 disc protrusion without stenosis.  Anemia  Mild hypokalemia - 3.4 (on diuretic -> supplement)  Proteinuria - renal US unremarkable     Hospital day # 1   Personally examined patient and images, and have participated in and made any corrections needed to history, physical, neuro exam,assessment and plan as stated above.  I have personally obtained the history, evaluated lab date, reviewed imaging studies and agree with radiology interpretations.    Naomie DeanAntonia Ahern, MD Stroke Neurology  To contact Stroke Continuity provider, please refer to [email or web address]. After hours, contact General Neurology

## 2017-07-25 NOTE — Progress Notes (Signed)
OT Cancellation Note  Patient Details Name: Mechele CollinHhlek Rah Lan MRN: [id number] DOB: 07/25/1960   Cancelled Treatment:    Reason Eval/Treat Not Completed: Active bedrest order. Will check back as appropriate.   Doristine Sectionharity A Jonella Redditt, MS OTR/L  Pager: [phone]   Doristine SectionCharity A Xoey Warmoth 07/25/2017, 6:54 AM

## 2017-07-25 NOTE — Progress Notes (Signed)
PROGRESS NOTE    Diane Berry  VWU:[id number] DOB: 01-Jun-1961 DOA: 07/23/2017 PCP: System, Pcp Not In    Brief Narrative:  57 year old female who presents generalized body aches and severe headache.  She does have a significant past medical history of hypertension.  She had symptoms for 2 days prior to hospitalization, associated abdominal pain.  On the initial physical examination blood pressure 190/158, heart rate 110, respiratory rate 31, temperature 99.8, oxygen saturation 100%.  She was awake and alert, nonfocal, her lungs were clear to auscultation bilaterally, heart S1-S2 present rhythmic, her abdomen was soft nontender, no lower extremity edema.  Sodium 137, potassium 3.6, chloride 106, bicarb 20, glucose 126, BUN 11, creatinine 0.76, white count 7.0, hemoglobin 11.7, hematocrit 36.6, platelets 232.  TSH is 0.26, free T4 0.95, influenza negative, urinalysis greater than 30 protein, specific gravity is 1.024, white cells 6-30.  CT of the abdomen with wall thickening of the descending/sigmoid colon.  Head CT with acute hemorrhage in the right lateral ventricle.  Hematoma volume 4.5 mL, negative for hydrocephalus.  Head CT angiography no vascular malformation.  Chest x-ray with right hemidiaphragm elevation, no infiltrates.  EKG, sinus rhythm with 94 bpm, normal axis, normal intervals.   Patient was admitted with the working diagnosis of hypertensive emergency complicated by intracranial bleed   Assessment & Plan:   Principal Problem:   SIRS (systemic inflammatory response syndrome) (HCC) Active Problems:   Elevated blood pressure reading   ICH (intracerebral hemorrhage) (HCC)   1.  Hypertensive emergency. Off nicardipine drip, blood pressure is at goal, will continue blood pressure control with hctz and lisinopril, will continue telemetry monitoring for the next 24 hours. Out of bed as tolerated. Follow bmp in am.   2.  Intracranial bleed. Follow MRI with no new bleeding, no AVMs or  aneurysms, will continue neuro checks, will need physical therapy.  3.  Chronic anemia. Hb and hct are stable, no signs of bleeding.   4. Sepsis ruled out. Off antibiotics.   DVT prophylaxis: scd  Code Status:  full Family Communication: I spoke with patient's daughter at the bedside, as interpreter. All questions were addressed.  Disposition Plan: home   Consultants:   Neurology   Neurosurgery  Procedures:     Antimicrobials:       Subjective: Patient with persistent headache, coronal, worse with movement, has decreased in intensity, dull in nature, radiation to the neck, improved with analgesics. No visual changes.   Objective: Vitals:   07/25/17 0700 07/25/17 0730 07/25/17 0800 07/25/17 0900  BP: (!) 111/56  (!) 120/58 120/70  Pulse: 89  92 93  Resp: (!) 24  18 (!) 23  Temp:  99.8 F (37.7 C)    TempSrc:  Oral    SpO2: 96%  98% 100%  Weight:      Height:        Intake/Output Summary (Last 24 hours) at 07/25/2017 0939 Last data filed at 07/25/2017 0800 Gross per 24 hour  Intake 1341.01 ml  Output 1600 ml  Net -258.99 ml   Filed Weights   07/23/17 2032 07/24/17 0458  Weight: 54.4 kg (120 lb) 49.9 kg (110 lb 0.2 oz)    Examination:   General: Not in pain or dyspnea, mild deconditioned Neurology: Awake and alert, non focal  E ENT: no pallor, no icterus, oral mucosa moist Cardiovascular: No JVD. S1-S2 present, rhythmic, no gallops, rubs, or murmurs. No lower extremity edema. Pulmonary: vesicular breath sounds bilaterally, adequate air movement, no  wheezing, rhonchi or rales. Gastrointestinal. Abdomen flat, no organomegaly, non tender, no rebound or guarding Skin. No rashes Musculoskeletal: no joint deformities     Data Reviewed: I have personally reviewed following labs and imaging studies  CBC: Recent Labs  Lab 07/23/17 2040 07/24/17 0415 07/25/17 0224  WBC 7.0 7.6 9.9  NEUTROABS 4.4 6.4 7.6  HGB 11.7* 11.0* 10.9*  HCT 36.6 35.0*  34.2*  MCV 71.8* 72.0* 70.8*  PLT 232 208 200   Basic Metabolic Panel: Recent Labs  Lab 07/23/17 2040 07/24/17 0415 07/25/17 0224  NA 137 137 135  K 3.6 3.4* 3.4*  CL 106 105 104  CO2 20* 19* 19*  GLUCOSE 126* 133* 126*  BUN 11 9 20   CREATININE 0.76 0.77 0.88  CALCIUM 9.7 8.9 9.2   GFR: Estimated Creatinine Clearance: 33.4 mL/min (by C-G formula based on SCr of 0.88 mg/dL). Liver Function Tests: Recent Labs  Lab 07/23/17 2040 07/24/17 0415  AST 41 37  ALT 16 16  ALKPHOS 96 87  BILITOT 1.2 1.2  PROT 8.4* 7.9  ALBUMIN 4.5 4.1   No results for input(s): LIPASE, AMYLASE in the last 168 hours. No results for input(s): AMMONIA in the last 168 hours. Coagulation Profile: Recent Labs  Lab 07/24/17 0924  INR 0.99   Cardiac Enzymes: Recent Labs  Lab 07/24/17 0659  TROPONINI 0.03*   BNP (last 3 results) No results for input(s): PROBNP in the last 8760 hours. HbA1C: Recent Labs    07/24/17 0659  HGBA1C 5.1   CBG: Recent Labs  Lab 07/24/17 1721 07/24/17 2136 07/25/17 0733  GLUCAP 131* 124* 102*   Lipid Profile: No results for input(s): CHOL, HDL, LDLCALC, TRIG, CHOLHDL, LDLDIRECT in the last 72 hours. Thyroid Function Tests: Recent Labs    07/24/17 0419 07/24/17 0659  TSH 0.260*  --   FREET4  --  0.95   Anemia Panel: Recent Labs    07/24/17 0659  VITAMINB12 1,254*  FOLATE 23.6  FERRITIN 188  TIBC 265  IRON 42  RETICCTPCT 2.5      Radiology Studies: I have reviewed all of the imaging during this hospital visit personally     Scheduled Meds: . diphenhydrAMINE  25 mg Intravenous Q0600  . hydrochlorothiazide  25 mg Oral Daily  . labetalol  20 mg Intravenous Once  . lisinopril  10 mg Oral Daily  . pantoprazole (PROTONIX) IV  40 mg Intravenous QHS  . senna-docusate  1 tablet Oral BID   Continuous Infusions: . niCARDipine Stopped (07/25/17 0825)     LOS: 1 day        Mauricio Annett Gulaaniel Arrien, MD [facility] Pager  [phone]

## 2017-07-25 NOTE — Progress Notes (Incomplete)
Patient continues to be on Cardene drip. Repeat CT completed.

## 2017-07-25 NOTE — Progress Notes (Signed)
Patient ID: Diane Berry, female   DOB: 12/23/1960, 57 y.o.   MRN: [id number] MRI of brain reviewed Images are consistent with hypertensive hemorrhage with basal ganglia involvement and intraventricular extension. No evidence of hydrocephalus. This will likely resolve itself. Her blood pressure is now under good control.  Please recontact us if any neurosurgical intervention is felt to be necessary

## 2017-07-26 LAB — GLUCOSE, CAPILLARY
GLUCOSE-CAPILLARY: 120 mg/dL — AB (ref 65–99)
Glucose-Capillary: 119 mg/dL — ABNORMAL HIGH (ref 65–99)
Glucose-Capillary: 114 mg/dL — ABNORMAL HIGH (ref 65–99)

## 2017-07-26 LAB — BASIC METABOLIC PANEL
Anion gap: 12 (ref 5–15)
BUN: 26 mg/dL — AB (ref 6–20)
CHLORIDE: 98 mmol/L — AB (ref 101–111)
CO2: 21 mmol/L — AB (ref 22–32)
CREATININE: 0.92 mg/dL (ref 0.44–1.00)
Calcium: 9.5 mg/dL (ref 8.9–10.3)
GFR calc Af Amer: >60 mL/min (ref >60)
GFR calc non Af Amer: >60 mL/min (ref >60)
GLUCOSE: 136 mg/dL — AB (ref 65–99)
Potassium: 4 mmol/L (ref 3.5–5.1)
SODIUM: 131 mmol/L — AB (ref 135–145)

## 2017-07-26 LAB — T3, FREE: T3 FREE: 2.2 pg/mL (ref 2.0–4.4)

## 2017-07-26 LAB — LIPID PANEL
Cholesterol: 181 mg/dL (ref 0–200)
HDL: 40 mg/dL — ABNORMAL LOW (ref >40)
LDL CALC: 117 mg/dL — AB (ref 0–99)
Total CHOL/HDL Ratio: 4.5 RATIO
Triglycerides: 120 mg/dL (ref <150)
VLDL: 24 mg/dL (ref 0–40)

## 2017-07-26 MED ORDER — ATORVASTATIN CALCIUM 40 MG PO TABS
40.0000 mg | ORAL_TABLET | Freq: Every day | ORAL | Status: DC
  Administered 2017-07-26 – 2017-07-27 (×2): 40 mg via ORAL
  Filled 2017-07-26 (×2): qty 1

## 2017-07-26 MED ORDER — BUTALBITAL-APAP-CAFFEINE 50-325-40 MG PO TABS
1.0000 | ORAL_TABLET | Freq: Four times a day (QID) | ORAL | Status: DC | PRN
  Administered 2017-07-26 – 2017-07-28 (×6): 2 via ORAL
  Filled 2017-07-26 (×4): qty 2
  Filled 2017-07-26: qty 1
  Filled 2017-07-26: qty 2
  Filled 2017-07-26: qty 1

## 2017-07-26 MED ORDER — TOPIRAMATE 25 MG PO TABS
25.0000 mg | ORAL_TABLET | Freq: Two times a day (BID) | ORAL | Status: DC
  Administered 2017-07-26 – 2017-07-28 (×5): 25 mg via ORAL
  Filled 2017-07-26 (×5): qty 1

## 2017-07-26 NOTE — Consult Note (Signed)
Chief Complaint: Patient was seen in consultation today for stroke  Referring Physician(s): Dr. Pearlean BrownieSethi  Supervising Physician: Julieanne Cottoneveshwar, Sanjeev  [facility]  History of Present Illness: Diane Berry is a 57 y.o. female with history of hypertension who [facility] ED 07/24/17 with body aches, fever, and headache. CT Head showed an acute hemorrhage in the right lateral ventricle.   CTA Head/Neck 07/24/17 showed: Intraventricular hemorrhage right lateral ventricle unchanged. No underlying vascular malformation. Hemorrhage is presumably hypertensive.  Severe intracranial atherosclerotic disease. Moderate calcific stenosis in the cavernous carotid bilaterally. Severe calcific stenosis distal vertebral artery bilaterally. Extensive atherosclerotic disease throughout the anterior, middle, and posterior cerebral arteries without large vessel occlusion  Calcific stenosis in the carotid bifurcation bilaterally. No significant stenosis right carotid. Less than 25% diameter stenosis left internal carotid artery.  IR consulted for cerebral angiogram at the request of Dr. Pearlean BrownieSethi.    Past Medical History:  Diagnosis Date  . Stroke (HCC) 07/24/2017   intraventricular hemorrhage    History reviewed. No pertinent surgical history.  Allergies: Patient has no known allergies.  Medications: Prior to Admission medications   Medication Sig Start Date End Date Taking? Authorizing Provider  acetaminophen (TYLENOL) 500 MG tablet Take 500 mg by mouth every 6 (six) hours as needed for mild pain, fever or headache.   Yes [provider]  bismuth subsalicylate (PEPTO BISMOL) 262 MG/15ML suspension Take 30 mLs by mouth every 6 (six) hours as needed for indigestion.   Yes [provider]  ibuprofen (ADVIL,MOTRIN) 200 MG tablet Take 200 mg by mouth every 6 (six) hours as needed for fever or headache.   Yes [provider]     Family History    Problem Relation Age of Onset  . Hypertension Sister     Social History   Socioeconomic History  . Marital status: Widowed    Spouse name: None  . Number of children: None  . Years of education: None  . Highest education level: None  Social Needs  . Financial resource strain: None  . Food insecurity - worry: None  . Food insecurity - inability: None  . Transportation needs - medical: None  . Transportation needs - non-medical: None  Occupational History  . None  Tobacco Use  . Smoking status: Never Smoker  . Smokeless tobacco: Never Used  Substance and Sexual Activity  . Alcohol use: No    Frequency: Never  . Drug use: No  . Sexual activity: None  Other Topics Concern  . None  Social History Narrative  . None     Review of Systems: A 12 point ROS discussed and pertinent positives are indicated in the HPI above.  All other systems are negative.  Review of Systems  Constitutional: Negative for fatigue and fever.  Respiratory: Negative for cough and shortness of breath.   Cardiovascular: Negative for chest pain.  Gastrointestinal: Negative for abdominal pain.  Neurological: Positive for headaches. Negative for weakness.  Psychiatric/Behavioral: Negative for behavioral problems and confusion.    Vital Signs: BP (!) 100/59 (BP Location: Left Arm)   Pulse 94   Temp 98.3 F (36.8 C) (Oral)   Resp 18   Ht 4' (1.219 m)   Wt 110 lb 0.2 oz (49.9 kg)   SpO2 98%   BMI 33.57 kg/m   Physical Exam  Constitutional: She is oriented to person, place, and time. She appears well-developed.  Cardiovascular: Normal rate, regular rhythm and normal heart sounds.  Pulmonary/Chest: Effort normal and breath sounds normal. No respiratory distress.  Abdominal: Soft.  Neurological: She is alert and oriented to person, place, and time.  Skin: Skin is warm and dry.  Psychiatric: She has a normal mood and affect. Her behavior is normal. Judgment and thought content normal.  Nursing  note and vitals reviewed.    MD Evaluation Airway: WNL Heart: WNL Abdomen: WNL Chest/ Lungs: WNL ASA  Classification: 3 Mallampati/Airway Score: One   Imaging: Ct Angio Head W Or Wo Contrast  Result Date: 07/24/2017 CLINICAL DATA:  Intracranial hemorrhage.  Hypertension EXAM: CT ANGIOGRAPHY HEAD AND NECK TECHNIQUE: Multidetector CT imaging of the head and neck was performed using the standard protocol during bolus administration of intravenous contrast. Multiplanar CT image reconstructions and MIPs were obtained to evaluate the vascular anatomy. Carotid stenosis measurements (when applicable) are obtained utilizing NASCET criteria, using the distal internal carotid diameter as the denominator. CONTRAST:  50mL ISOVUE-370 IOPAMIDOL (ISOVUE-370) INJECTION 76% COMPARISON:  CT head 07/24/2017 FINDINGS: CTA NECK FINDINGS Aortic arch: Mild atherosclerotic disease aortic arch. Proximal great vessels widely patent. Right carotid system: Mild atherosclerotic calcification of the carotid bifurcation without significant stenosis Left carotid system: Atherosclerotic calcification at the carotid bifurcation. Less than 25% diameter stenosis left internal carotid artery. Vertebral arteries: Left vertebral artery dominant. Calcific plaque of the distal vertebral artery with severe stenosis bilaterally. Both vertebral arteries patent to the basilar. Skeleton: Prominent vascular markings in the skull. No acute skeletal abnormality. Other neck: Negative for soft tissue mass or edema Upper chest: Negative Review of the MIP images confirms the above findings CTA HEAD FINDINGS Anterior circulation: Extensive atherosclerotic calcification in the cavernous carotid bilaterally with moderate carotid stenosis bilaterally. Atherosclerotic irregularity in the anterior cerebral arteries and middle cerebral arteries bilaterally. Multiple areas of vessel irregularity and stenosis. No large vessel occlusion. Negative for vascular  malformation. Posterior circulation: Severe calcific stenosis both vertebral arteries at the level of the skull base. Mild irregularity of the proximal basilar which is slightly dilated with a small filling defect felt to be plaque. No significant basilar stenosis. PICA, superior cerebellar, and posterior cerebral arteries patent bilaterally. Atherosclerotic irregularity in the posterior cerebral arteries bilaterally. No large vessel occlusion. Negative for vascular malformation. Venous sinuses: Patent Anatomic variants: None Delayed phase: Intraventricular hemorrhage right lateral ventricle unchanged measuring 31 x 16 mm. Negative for hydrocephalus. No enhancing mass lesion. Review of the MIP images confirms the above findings IMPRESSION: Intraventricular hemorrhage right lateral ventricle unchanged. No underlying vascular malformation. Hemorrhage is presumably hypertensive. Severe intracranial atherosclerotic disease. Moderate calcific stenosis in the cavernous carotid bilaterally. Severe calcific stenosis distal vertebral artery bilaterally. Extensive atherosclerotic disease throughout the anterior, middle, and posterior cerebral arteries without large vessel occlusion Calcific stenosis in the carotid bifurcation bilaterally. No significant stenosis right carotid. Less than 25% diameter stenosis left internal carotid artery. Electronically Signed   By: Marlan Palau M.D.   On: 07/24/2017 09:56   Dg Chest 2 View  Result Date: 07/23/2017 CLINICAL DATA:  Cough, shortness of breath, fever, chills, and nausea. Former smoker. EXAM: CHEST  2 VIEW COMPARISON:  None. FINDINGS: The cardiac silhouette is borderline to mildly enlarged. Aortic atherosclerosis is noted. There is anterior eventration of the right hemidiaphragm. No confluent airspace opacity, edema, pleural effusion, or pneumothorax is identified. No acute osseous abnormality is seen. IMPRESSION: No active cardiopulmonary disease. Electronically Signed    By: Sebastian Ache M.D.   On: 07/23/2017 21:13   Ct Head Wo Contrast  Result Date:  07/24/2017 CLINICAL DATA:  Follow-up intraventricular hemorrhage. EXAM: CT HEAD WITHOUT CONTRAST TECHNIQUE: Contiguous axial images were obtained from the base of the skull through the vertex without intravenous contrast. COMPARISON:  Head CT and MRI earlier today FINDINGS: Brain: Hemorrhage in the right lateral ventricle has not changed in size from today's earlier CT, measuring 3.3 x 1.8 x 1.5 cm (estimated volume of 4.5 cc). Trace blood products are again noted in the occipital horns of the lateral ventricles and in the fourth ventricle, more conspicuous on MRI. The ventricles are unchanged in size. No new intracranial hemorrhage, midline shift, acute large territory infarct, or extra-axial fluid collection is seen. A background of age advanced chronic small vessel ischemic disease is again noted in the cerebral white matter, and there is a chronic infarct in the pons. Vascular: Calcified atherosclerosis scratched at age advanced calcified atherosclerosis at the skull base. No hyperdense vessel. Skull: No fracture focal osseous lesion. Sinuses/Orbits: Left buphthalmos. Opacified, small right frontal sinus. Clear mastoid air cells. Other: None. IMPRESSION: Unchanged right lateral intraventricular hemorrhage. No hydrocephalus or new intracranial abnormality. Electronically Signed   By: Sebastian Ache M.D.   On: 07/24/2017 20:42   Ct Head Wo Contrast  Result Date: 07/24/2017 CLINICAL DATA:  Headache, hypertension EXAM: CT HEAD WITHOUT CONTRAST TECHNIQUE: Contiguous axial images were obtained from the base of the skull through the vertex without intravenous contrast. COMPARISON:  None. FINDINGS: Brain: Acute hemorrhage in the right lateral ventricle. Hematoma volume 4.5 mL. No other acute hemorrhage. Negative for hydrocephalus. 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 Chronic microvascular ischemic change in the white matter. 8  mm coarse calcification in the left frontal lobe appears benign. Negative for acute infarct.  No mass or edema. Vascular: Negative for hyperdense vessel Skull: Prominent vascular markings throughout the skull Sinuses/Orbits: Chronic mucosal edema and bony thickening of the maxillary sinus bilaterally. Negative orbit. Other: None IMPRESSION: Acute hemorrhage in the right lateral ventricle. Hematoma volume 4.5 mL. Negative for hydrocephalus. No underlying mass identified. CTA head recommended to evaluate for vascular malformation. Intracranial atherosclerotic disease with chronic microvascular ischemia. No acute ischemic infarction. These results were called by telephone at the time of interpretation on 07/24/2017 at 6:58 am to Dr. Midge Minium , who verbally acknowledged these results. Electronically Signed   By: Marlan Palau M.D.   On: 07/24/2017 06:58   Ct Angio Neck W Or Wo Contrast  Result Date: 07/24/2017 CLINICAL DATA:  Intracranial hemorrhage.  Hypertension EXAM: CT ANGIOGRAPHY HEAD AND NECK TECHNIQUE: Multidetector CT imaging of the head and neck was performed using the standard protocol during bolus administration of intravenous contrast. Multiplanar CT image reconstructions and MIPs were obtained to evaluate the vascular anatomy. Carotid stenosis measurements (when applicable) are obtained utilizing NASCET criteria, using the distal internal carotid diameter as the denominator. CONTRAST:  50mL ISOVUE-370 IOPAMIDOL (ISOVUE-370) INJECTION 76% COMPARISON:  CT head 07/24/2017 FINDINGS: CTA NECK FINDINGS Aortic arch: Mild atherosclerotic disease aortic arch. Proximal great vessels widely patent. Right carotid system: Mild atherosclerotic calcification of the carotid bifurcation without significant stenosis Left carotid system: Atherosclerotic calcification at the carotid bifurcation. Less than 25% diameter stenosis left internal carotid artery. Vertebral arteries: Left vertebral artery dominant.  Calcific plaque of the distal vertebral artery with severe stenosis bilaterally. Both vertebral arteries patent to the basilar. Skeleton: Prominent vascular markings in the skull. No acute skeletal abnormality. Other neck: Negative for soft tissue mass or edema Upper chest: Negative Review of the MIP images confirms the above findings  CTA HEAD FINDINGS Anterior circulation: Extensive atherosclerotic calcification in the cavernous carotid bilaterally with moderate carotid stenosis bilaterally. Atherosclerotic irregularity in the anterior cerebral arteries and middle cerebral arteries bilaterally. Multiple areas of vessel irregularity and stenosis. No large vessel occlusion. Negative for vascular malformation. Posterior circulation: Severe calcific stenosis both vertebral arteries at the level of the skull base. Mild irregularity of the proximal basilar which is slightly dilated with a small filling defect felt to be plaque. No significant basilar stenosis. PICA, superior cerebellar, and posterior cerebral arteries patent bilaterally. Atherosclerotic irregularity in the posterior cerebral arteries bilaterally. No large vessel occlusion. Negative for vascular malformation. Venous sinuses: Patent Anatomic variants: None Delayed phase: Intraventricular hemorrhage right lateral ventricle unchanged measuring 31 x 16 mm. Negative for hydrocephalus. No enhancing mass lesion. Review of the MIP images confirms the above findings IMPRESSION: Intraventricular hemorrhage right lateral ventricle unchanged. No underlying vascular malformation. Hemorrhage is presumably hypertensive. Severe intracranial atherosclerotic disease. Moderate calcific stenosis in the cavernous carotid bilaterally. Severe calcific stenosis distal vertebral artery bilaterally. Extensive atherosclerotic disease throughout the anterior, middle, and posterior cerebral arteries without large vessel occlusion Calcific stenosis in the carotid bifurcation  bilaterally. No significant stenosis right carotid. Less than 25% diameter stenosis left internal carotid artery. Electronically Signed   By: Marlan Palau M.D.   On: 07/24/2017 09:56   Mr Laqueta Jean ZO Contrast  Result Date: 07/24/2017 CLINICAL DATA:  Headache, neck pain, and generalized body aches for 3 days. Hypertensive. Intraventricular hemorrhage in the right lateral ventricle on CT. EXAM: MRI HEAD WITHOUT AND WITH CONTRAST MRI CERVICAL SPINE WITHOUT AND WITH CONTRAST TECHNIQUE: Multiplanar, multiecho pulse sequences of the brain and surrounding structures, and cervical spine, to include the craniocervical junction and cervicothoracic junction, were obtained without and with intravenous contrast. CONTRAST:  10mL MULTIHANCE GADOBENATE DIMEGLUMINE 529 MG/ML IV SOLN COMPARISON:  Head CT and CTA 07/24/2017 FINDINGS: MRI HEAD FINDINGS Brain: Hemorrhage in the body and atrium of the right lateral ventricle has not significantly changed in size from the earlier CT allowing for differences in modality. This measures approximately 4.4 x 1.4 x 2.0 cm on MRI (calculated volume of 6.2 mL). There is minimal surrounding artifact on diffusion-weighted imaging without evidence of an acute parenchymal infarct. A small amount of blood products are also now evident in the occipital horns of both lateral ventricle as well as well as in the fourth ventricle. The ventricles are unchanged in size, and there is no evidence of hydrocephalus. No underlying enhancing mass or definite vascular malformation is identified. There is mild cerebral atrophy. Chronic microhemorrhages are present in the cerebellum, brainstem, bilateral basal ganglia, left thalamus, and both cerebral hemispheres. Preferential involvement of the deep gray nuclei and brainstem favors hypertension as the underlying etiology of the chronic hemorrhages. There are chronic lacunar infarcts in the deep cerebral white matter bilaterally, left thalamus, and pons. Patchy  T2 hyperintensities throughout the subcortical and deep cerebral white matter and brainstem are nonspecific but compatible with chronic small vessel ischemia, greatly advanced for age. There is no intracranial mass/mass effect or extra-axial fluid collection. Vascular: Major intracranial vascular flow voids are preserved. Skull and upper cervical spine: Unremarkable bone marrow signal. Sinuses/Orbits: Left buphthalmos. Mild paranasal sinus mucosal thickening. Small bilateral maxillary sinuses. Clear mastoid air cells. Other: None. MRI CERVICAL SPINE FINDINGS The study is moderately motion degraded. Alignment: Normal. Vertebrae: No fracture, suspicious osseous lesion, or significant marrow edema. Cord: Normal signal and morphology. Posterior Fossa, vertebral arteries, paraspinal tissues: Grossly preserved vertebral artery  flow voids. No gross paraspinal soft tissue abnormality. Disc levels: C2-3 through C4-5: No significant findings identified within limitations of motion artifact. C5-6: Mild disc space narrowing. Disc bulging/right paracentral disc protrusion result in mild-to-moderate spinal stenosis with slight flattening of the ventral spinal cord. No significant neural foraminal stenosis. C6-7: Tiny left central disc protrusion without stenosis or spinal cord mass effect. C7-T1: Negative. IMPRESSION: 1. Unchanged right lateral intraventricular hemorrhage. Small volume blood products in the left lateral and fourth ventricles. No hydrocephalus. 2. No acute infarct or mass identified. 3. Markedly age advanced chronic small vessel ischemic disease with multiple chronic lacunar infarcts as above. 4. Numerous chronic microhemorrhages favoring chronic hypertension. 5. Motion degraded cervical spine MRI. C5-6 disc degeneration with mild-to-moderate spinal stenosis. 6. Tiny C6-7 disc protrusion without stenosis. Electronically Signed   By: Sebastian Ache M.D.   On: 07/24/2017 14:30   Mr Cervical Spine W Wo  Contrast  Result Date: 07/24/2017 CLINICAL DATA:  Headache, neck pain, and generalized body aches for 3 days. Hypertensive. Intraventricular hemorrhage in the right lateral ventricle on CT. EXAM: MRI HEAD WITHOUT AND WITH CONTRAST MRI CERVICAL SPINE WITHOUT AND WITH CONTRAST TECHNIQUE: Multiplanar, multiecho pulse sequences of the brain and surrounding structures, and cervical spine, to include the craniocervical junction and cervicothoracic junction, were obtained without and with intravenous contrast. CONTRAST:  10mL MULTIHANCE GADOBENATE DIMEGLUMINE 529 MG/ML IV SOLN COMPARISON:  Head CT and CTA 07/24/2017 FINDINGS: MRI HEAD FINDINGS Brain: Hemorrhage in the body and atrium of the right lateral ventricle has not significantly changed in size from the earlier CT allowing for differences in modality. This measures approximately 4.4 x 1.4 x 2.0 cm on MRI (calculated volume of 6.2 mL). There is minimal surrounding artifact on diffusion-weighted imaging without evidence of an acute parenchymal infarct. A small amount of blood products are also now evident in the occipital horns of both lateral ventricle as well as well as in the fourth ventricle. The ventricles are unchanged in size, and there is no evidence of hydrocephalus. No underlying enhancing mass or definite vascular malformation is identified. There is mild cerebral atrophy. Chronic microhemorrhages are present in the cerebellum, brainstem, bilateral basal ganglia, left thalamus, and both cerebral hemispheres. Preferential involvement of the deep gray nuclei and brainstem favors hypertension as the underlying etiology of the chronic hemorrhages. There are chronic lacunar infarcts in the deep cerebral white matter bilaterally, left thalamus, and pons. Patchy T2 hyperintensities throughout the subcortical and deep cerebral white matter and brainstem are nonspecific but compatible with chronic small vessel ischemia, greatly advanced for age. There is no  intracranial mass/mass effect or extra-axial fluid collection. Vascular: Major intracranial vascular flow voids are preserved. Skull and upper cervical spine: Unremarkable bone marrow signal. Sinuses/Orbits: Left buphthalmos. Mild paranasal sinus mucosal thickening. Small bilateral maxillary sinuses. Clear mastoid air cells. Other: None. MRI CERVICAL SPINE FINDINGS The study is moderately motion degraded. Alignment: Normal. Vertebrae: No fracture, suspicious osseous lesion, or significant marrow edema. Cord: Normal signal and morphology. Posterior Fossa, vertebral arteries, paraspinal tissues: Grossly preserved vertebral artery flow voids. No gross paraspinal soft tissue abnormality. Disc levels: C2-3 through C4-5: No significant findings identified within limitations of motion artifact. C5-6: Mild disc space narrowing. Disc bulging/right paracentral disc protrusion result in mild-to-moderate spinal stenosis with slight flattening of the ventral spinal cord. No significant neural foraminal stenosis. C6-7: Tiny left central disc protrusion without stenosis or spinal cord mass effect. C7-T1: Negative. IMPRESSION: 1. Unchanged right lateral intraventricular hemorrhage. Small volume blood products in  the left lateral and fourth ventricles. No hydrocephalus. 2. No acute infarct or mass identified. 3. Markedly age advanced chronic small vessel ischemic disease with multiple chronic lacunar infarcts as above. 4. Numerous chronic microhemorrhages favoring chronic hypertension. 5. Motion degraded cervical spine MRI. C5-6 disc degeneration with mild-to-moderate spinal stenosis. 6. Tiny C6-7 disc protrusion without stenosis. Electronically Signed   By: Sebastian Ache M.D.   On: 07/24/2017 14:30   Ct Abdomen Pelvis W Contrast  Result Date: 07/24/2017 CLINICAL DATA:  Acute abdominal pain.  Nausea and vomiting. EXAM: CT ABDOMEN AND PELVIS WITH CONTRAST TECHNIQUE: Multidetector CT imaging of the abdomen and pelvis was performed  using the standard protocol following bolus administration of intravenous contrast. CONTRAST:  ISOVUE-300 IOPAMIDOL (ISOVUE-300) INJECTION 61% COMPARISON:  None. FINDINGS: Lower chest: Motion artifact.  No consolidation or pleural fluid. Hepatobiliary: No focal liver abnormality is seen. No gallstones, gallbladder wall thickening, or biliary dilatation. Pancreas: No ductal dilatation or inflammation. Spleen: Normal in size without focal abnormality. Adrenals/Urinary Tract: No adrenal nodule. Mild motion artifact through the kidneys limit assessment. Extrarenal pelvis configuration of the left kidney. Prominence of both renal collecting systems. Urinary bladder is distended. No bladder wall thickening. Multiple small cortical low-density lesions in both kidneys are too small to accurately characterize. Stomach/Bowel: Lack of enteric contrast and paucity of intra-abdominal fat limits bowel assessment. The stomach is nondistended. No bowel obstruction. Equivocal wall thickening at the descending/sigmoid colon junction versus more likely nondistention. Normal appendix. No bowel inflammation. Vascular/Lymphatic: Mild aortic atherosclerosis. No aneurysm. No enlarged abdominal or pelvic lymph nodes. Small calcified mesenteric node in the right lower abdomen. Reproductive: Uterus and bilateral adnexa are unremarkable. Other: No free air, free fluid, or intra-abdominal fluid collection. Musculoskeletal: There are no acute or suspicious osseous abnormalities. IMPRESSION: 1. Equivocal wall thickening of the descending/sigmoid colon junction versus more likely nondistention. 2.  Aortic Atherosclerosis (ICD10-I70.0). 3. Too small to characterize bilateral renal lesions, likely cysts. Electronically Signed   By: Rubye Oaks M.D.   On: 07/24/2017 01:28   US Renal  Result Date: 07/24/2017 CLINICAL DATA:  Proteinuria EXAM: RENAL / URINARY TRACT ULTRASOUND COMPLETE COMPARISON:  Same day CT FINDINGS: Right Kidney:  Length: 10 cm. Echogenicity within normal limits. No mass or hydronephrosis visualized. The subcentimeter hypodensities best seen on CT compatible with cysts are not well visualized sonographically. Mild pelvocaliectasis. Left Kidney: Length: 10.4 cm. Echogenicity within normal limits. No mass or hydronephrosis visualized. The small subcentimeter hypodensities statistically consistent with cysts best seen on CT are not well visualized sonographically. Mild pelvocaliectasis noted. Bladder: Appears normal for degree of bladder distention. IMPRESSION: Mild ectasia of the renal calices bilaterally. No obstructive uropathy. No sonographic findings to explain the patient's proteinuria. Electronically Signed   By: Tollie Eth M.D.   On: 07/24/2017 19:02    Labs:  CBC: Recent Labs    07/23/17 2040 07/24/17 0415 07/25/17 0224  WBC 7.0 7.6 9.9  HGB 11.7* 11.0* 10.9*  HCT 36.6 35.0* 34.2*  PLT 232 208 200    COAGS: Recent Labs    07/24/17 0924  INR 0.99    BMP: Recent Labs    07/23/17 2040 07/24/17 0415 07/25/17 0224 07/25/17 2359  NA 137 137 135 131*  K 3.6 3.4* 3.4* 4.0  CL 106 105 104 98*  CO2 20* 19* 19* 21*  GLUCOSE 126* 133* 126* 136*  BUN 11 9 20  26*  CALCIUM 9.7 8.9 9.2 9.5  CREATININE 0.76 0.77 0.88 0.92  GFRNONAA >60 >60 >  60 >60  GFRAA >60 >60 >60 >60    LIVER FUNCTION TESTS: Recent Labs    07/23/17 2040 07/24/17 0415  BILITOT 1.2 1.2  AST 41 37  ALT 16 16  ALKPHOS 96 87  PROT 8.4* 7.9  ALBUMIN 4.5 4.1    TUMOR MARKERS: No results for input(s): AFPTM, CEA, CA199, CHROMGRNA in the last 8760 hours.  Assessment and Plan: Intraventricular hemorrhage Patient with history of of HTN; elevated SBP on admission.  Found to have intraventricular hemorrhage.  IR consulted for cerebral angiogram at the request of Dr. Pearlean Brownie.  Patient's daughter has been Nurse, learning disability for patient due to specific dialect of [place] without Engineer, manufacturing systems.  Spoke with patient's  daughter re: procedure.  She would like to speak with family to discuss further.  Notified RN. Patient NPO after midnight for possible procedure if patient/family agreeable.   Thank you for this interesting consult.  I greatly enjoyed meeting [facility] Rah Daryl Eastern and look forward to participating in their care.  A copy of this report was sent to the requesting provider on this date.  Electronically Signed: Hoyt Koch, PA 07/26/2017, 2:42 PM   I spent a total of 40 Minutes    in face to face in clinical consultation, greater than 50% of which was counseling/coordinating care for intraventricular hemorrhage.

## 2017-07-26 NOTE — Progress Notes (Signed)
STROKE TEAM PROGRESS NOTE   HISTORY OF PRESENT ILLNESS (per record) Taraneh Rah Daryl EasternLan is a 57 y.o. female [place] who presents with 3 day history of headache, neck pain, and generalized body aches. Patient also has been having multiple complaints including abdominal discomfort. BP is 190 systolic on arrival. Patient admitted to medicine service from ER for SIRS workup and started on broad spectrum antibiotics.   After admission and assessment by hospitalist MD, a stat CT Head was ordered which showed right lateral Intraventricular hemorrhage. Patient rceived IV hydralazine and blood pressure was brought below 140 systolic. Neurology was consulted immediately.   On assessment at 8 am patient alert, no evidence of weakness. Complaing of severe headache and neck stiffness. Slightly confused per daughter. Patient's daughter provided [place] translation.   Date last known well: 3 days ago  tPA Given: no, ICH NIHSS: 0 Baseline MRS 0  SUBJECTIVE (INTERVAL HISTORY) Her daughter is at bedside and acts as interpreter. She continues ot have moderate headache and neck pain but otherwise neurologically non focal. Per nursing has not been requesting medication for H/A but this may be due to language barrier. Nurse has re-educated patient and daughter about regular pain medications available for H/A. Voices no new complaints. Case Management assisting with PCP and hopefully HH care.  OBJECTIVE Temp:  [98 F (36.7 C)-99.3 F (37.4 C)] 98.3 F (36.8 C) (02/18 1200) Pulse Rate:  [80-104] 94 (02/18 1200) Cardiac Rhythm: Normal sinus rhythm (02/18 0701) Resp:  [16-21] 18 (02/18 1200) BP: (100-164)/(59-98) 100/59 (02/18 1200) SpO2:  [97 %-100 %] 98 % (02/18 1200)  CBC:  Recent Labs  Lab 07/24/17 0415 07/25/17 0224  WBC 7.6 9.9  NEUTROABS 6.4 7.6  HGB 11.0* 10.9*  HCT 35.0* 34.2*  MCV 72.0* 70.8*  PLT 208 200    Basic Metabolic Panel:  Recent Labs  Lab 07/25/17 0224 07/25/17 2359  NA  135 131*  K 3.4* 4.0  CL 104 98*  CO2 19* 21*  GLUCOSE 126* 136*  BUN 20 26*  CREATININE 0.88 0.92  CALCIUM 9.2 9.5    Lipid Panel:     Component Value Date/Time   CHOL 181 07/25/2017 2359   TRIG 120 07/25/2017 2359   HDL 40 (L) 07/25/2017 2359   CHOLHDL 4.5 07/25/2017 2359   VLDL 24 07/25/2017 2359   LDLCALC 117 (H) 07/25/2017 2359   HgbA1c:  Lab Results  Component Value Date   HGBA1C 5.1 07/24/2017   Urine Drug Screen: No results found for: LABOPIA, COCAINSCRNUR, LABBENZ, AMPHETMU, THCU, LABBARB  Alcohol Level No results found for: ETH  IMAGING  Ct Angio Head W Or Wo Contrast Ct Angio Neck W Or Wo Contrast 07/24/2017 IMPRESSION:  Intraventricular hemorrhage right lateral ventricle unchanged.  No underlying vascular malformation. Hemorrhage is presumably hypertensive.  Severe intracranial atherosclerotic disease. Moderate calcific stenosis in the cavernous carotid bilaterally.  Severe calcific stenosis distal vertebral artery bilaterally.  Extensive atherosclerotic disease throughout the anterior, middle, and posterior cerebral arteries without large vessel occlusion.  Calcific stenosis in the carotid bifurcation bilaterally. No significant stenosis right carotid.  Less than 25% diameter stenosis left internal carotid artery.   Dg Chest 2 View 07/23/2017 IMPRESSION:  No active cardiopulmonary disease.   Ct Head Wo Contrast 07/24/2017 IMPRESSION:  Unchanged right lateral intraventricular hemorrhage.  No hydrocephalus or new intracranial abnormality.   Ct Head Wo Contrast 07/24/2017 IMPRESSION:  Acute hemorrhage in the right lateral ventricle. Hematoma volume 4.5 mL. Negative for hydrocephalus. No underlying mass identified.  CTA head recommended to evaluate for vascular malformation. Intracranial atherosclerotic disease with chronic microvascular ischemia. No acute ischemic infarction.   Mr Laqueta Jean Wo Contrast Mr Cervical Spine W Wo  Contrast 07/24/2017 IMPRESSION:  1. Unchanged right lateral intraventricular hemorrhage. Small volume blood products in the left lateral and fourth ventricles. No hydrocephalus.  2. No acute infarct or mass identified.  3. Markedly age advanced chronic small vessel ischemic disease with multiple chronic lacunar infarcts as above. 4. Numerous chronic microhemorrhages favoring chronic hypertension.   Mr Cervical Spine W Wo Contrast 07/24/2017 Motion degraded cervical spine MRI. C5-6 disc degeneration with mild-to-moderate spinal stenosis. Tiny C6-7 disc protrusion without stenosis.  Ct Abdomen Pelvis W Contrast 07/24/2017 IMPRESSION:  1. Equivocal wall thickening of the descending/sigmoid colon junction versus more likely nondistention.  2.  Aortic Atherosclerosis (ICD10-I70.0).  3. Too small to characterize bilateral renal lesions, likely cysts.   US Renal 07/24/2017 IMPRESSION:  Mild ectasia of the renal calices bilaterally. No obstructive uropathy.  No sonographic findings to explain the patient's proteinuria.   PHYSICAL EXAM Vitals:   07/26/17 0447 07/26/17 0556 07/26/17 0800 07/26/17 1200  BP: (!) 151/78 139/66 132/67 (!) 100/59  Pulse: (!) 101 (!) 103 80 94  Resp: 20 20 18 18   Temp: 99.3 F (37.4 C)  98.3 F (36.8 C) 98.3 F (36.8 C)  TempSrc: Oral  Oral Oral  SpO2: 100% 97% 98% 98%  Weight:      Height:        PHYSICAL EXAM Physical exam: Exam: Gen: NAD Eyes: anicteric sclerae, moist conjunctivae                    CV: no MRG, no carotid bruits, no peripheral edema Mental Status: Alert, follows commands, good historian  Neuro: Detailed Neurologic Exam  Speech:    No aphasia, no dysarthria  Cranial Nerves:    The pupils are equal, round, and reactive to light.. Attempted, Fundi not visualized.  EOMI. No gaze preference. Visual fields full. Face symmetric, Tongue midline. Hearing intact to voice. Shoulder shrug intact  Motor Observation:    no involuntary  movements noted. Tone appears normal.     Strength:    Able to move all 4 extremities against gravity. No focal weakness    Sensation:  Intact to LT  Plantars equiv.   ASSESSMENT/PLAN Ms. Zachary Rah Daryl Eastern is a 57 y.o. female with no significant past medical history presenting with a 3 day history of headache, neck pain, abdominal pain, and generalized body aches. She did not receive IV t-PA due to ICH.   ICH:  Felt secondary to uncontrolled hypertension.   Resultant  Non focal exam, patient has headache  CT head - Acute hemorrhage in the right lateral ventricle. Hematoma volume 4.5 mL.  MRI head - Unchanged right lateral intraventricular hemorrhage. Multiple chronic lacunar infarcts.  Neurosurgery following, no need for surgical intervention at this time. Appreciate Assistance  MRA head - diffuse cerebrovascular disease.  MRI C spine - mild-to-moderate spinal stenosis. Tiny C6-7 disc protrusion without stenosis.  CTA H&N - Severe intracranial atherosclerotic disease.   Cerebral Angiogram scheduled for 07/27/2017 with Dr Drusilla Kanner  Carotid Doppler - CTA neck  2D Echo - - not performed  LDL - 117  HgbA1c - 5.1  VTE prophylaxis - SCDs Fall precautions Diet Heart Room service appropriate? Yes; Fluid consistency: Thin Diet NPO time specified Except for: Sips with Meds  No antithrombotic prior to admission, now on No antithrombotic  Ongoing aggressive stroke risk factor management  Therapy recommendations:  HH  Disposition:  Likely home after Angiogram  Hypertension  Stable  Permissive hypertension (OK if < 220/120) but gradually normalize in 5-7 days  Long-term BP goal normotensive Hopsitalists following medical issues, Appreciate assistance  Hyperlipidemia  Home meds: No lipid lowering medications prior to admission  LDL 117, goal < 70  Lipitor 40 mg daily added  Other Stroke Risk Factors  Obesity, Body mass index is 33.57 kg/m., recommend weight loss,  diet and exercise as appropriate   Previous strokes by imaging  Other Active Problems  Severe intracranial atherosclerotic disease.  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 Tiny C6-7 disc protrusion without stenosis.  Anemia - stable, will continue to monitor closely  Mild hypokalemia - 3.4,  on diuretic -> supplement in progress  Proteinuria - renal US unremarkable  Hospital day # 2   Brita Romp Stroke Neurology Team 07/26/2017 2:49 PM I have personally examined this patient, reviewed notes, independently viewed imaging studies, participated in medical decision making and plan of care.ROS completed by me personally and pertinent positives fully documented  I have made any additions or clarifications directly to the above note. Agree with note above. Long discussion with the patient and daughter at the bedside regarding her prognosis. I recommend trial of Topamax 25 minute and twice daily for headache. Check diagnostic cerebral catheter angiogram tomorrow to look for AVM source of his hemorrhage. Greater than 50% time during this 35 minute visit was spent on counseling and coordination of care about her intracerebral hemorrhage and discussion about investigation and management and answering questions  Delia Heady, MD Medical Director [facility] Pager: [phone] 07/26/2017 3:16 PM  To contact Stroke Continuity provider, please refer to [email or web address]. After hours, contact General Neurology

## 2017-07-26 NOTE — Progress Notes (Signed)
PROGRESS NOTE    Diane Berry  WUJ:[id number] DOB: 11/18/1960 DOA: 07/23/2017 PCP: System, Pcp Not In    Brief Narrative:  57 year old female who presents generalized body aches and severe headache. She does have a significant past medical history of hypertension. She had symptoms for 2 days prior to hospitalization, associated abdominal pain. On the initial physical examination blood pressure 190/158, heart rate 110, respiratory rate 31, temperature 99.8, oxygen saturation 100%.She was awake and alert, nonfocal, her lungs were clear to auscultation bilaterally, heart S1-S2 present rhythmic, her abdomen was soft nontender, no lower extremity edema.Sodium 137, potassium 3.6, chloride 106, bicarb 20, glucose 126, BUN 11, creatinine 0.76, white count 7.0, hemoglobin 11.7, hematocrit 36.6, platelets 232.TSH is 0.26,free T4 0.95,influenza negative, urinalysis greater than 30 protein, specific gravity is 1.024, white cells 6-30.CT of the abdomen with wall thickening of the descending/sigmoid colon.Head CT with acute hemorrhage in the right lateral ventricle.Hematoma volume 4.5 mL, negative for hydrocephalus.Head CT angiography no vascular malformation.Chest x-ray with right hemidiaphragm elevation, no infiltrates. EKG,sinus rhythm with 94 bpm, normal axis, normal intervals.  Patient was admitted with the working diagnosis of hypertensive emergency complicated by intracranial bleed  Assessment & Plan:   Principal Problem:   SIRS (systemic inflammatory response syndrome) (HCC) Active Problems:   Elevated blood pressure reading   ICH (intracerebral hemorrhage) (HCC)  1.Hypertensive emergency.Blood pressure with well control with combination lisinopril and hctz, will continue close monitoring. Discusses with patient's daughter Musician(translator) salt restriction and life style changes.   2.Intracranial bleed. Stable, no clinical signs of worsening, will continue neuro checks. And  blood pressure control.   3.Chronic anemia. Stable. Will need follow up as outpatient.   4. Sepsis ruled out. Off antibiotics.   DVT prophylaxis:scd Code Status:full Family Communication:I spoke with patient's daughter at the bedside, as interpreter. All questions were addressed.  Disposition Plan:home   Consultants:  Neurology   Neurosurgery  Procedures:    Antimicrobials:     Subjective: Patient continue to have headache, worse with movement, no nausea or vomiting, blood pressure with improved control. Patient has been transferred to medical unit.   Objective: Vitals:   07/26/17 0447 07/26/17 0556 07/26/17 0800 07/26/17 1200  BP: (!) 151/78 139/66 132/67 (!) 100/59  Pulse: (!) 101 (!) 103 80 94  Resp: 20 20 18 18   Temp: 99.3 F (37.4 C)  98.3 F (36.8 C) 98.3 F (36.8 C)  TempSrc: Oral  Oral Oral  SpO2: 100% 97% 98% 98%  Weight:      Height:        Intake/Output Summary (Last 24 hours) at 07/26/2017 1234 Last data filed at 07/26/2017 1200 Gross per 24 hour  Intake 480 ml  Output 650 ml  Net -170 ml   Filed Weights   07/23/17 2032 07/24/17 0458  Weight: 54.4 kg (120 lb) 49.9 kg (110 lb 0.2 oz)    Examination:   General: Not in pain or dyspnea, deconditioned Neurology: Awake and alert, non focal  E ENT: no pallor, no icterus, oral mucosa moist Cardiovascular: No JVD. S1-S2 present, rhythmic, no gallops, rubs, or murmurs. No lower extremity edema. Pulmonary: vesicular breath sounds bilaterally, adequate air movement, no wheezing, rhonchi or rales. Gastrointestinal. Abdomen flat, no organomegaly, non tender, no rebound or guarding Skin. No rashes Musculoskeletal: no joint deformities     Data Reviewed: I have personally reviewed following labs and imaging studies  CBC: Recent Labs  Lab 07/23/17 2040 07/24/17 0415 07/25/17 0224  WBC 7.0 7.6 9.9  NEUTROABS 4.4 6.4 7.6  HGB 11.7* 11.0* 10.9*  HCT 36.6 35.0* 34.2*  MCV  71.8* 72.0* 70.8*  PLT 232 208 200   Basic Metabolic Panel: Recent Labs  Lab 07/23/17 2040 07/24/17 0415 07/25/17 0224 07/25/17 2359  NA 137 137 135 131*  K 3.6 3.4* 3.4* 4.0  CL 106 105 104 98*  CO2 20* 19* 19* 21*  GLUCOSE 126* 133* 126* 136*  BUN 11 9 20  26*  CREATININE 0.76 0.77 0.88 0.92  CALCIUM 9.7 8.9 9.2 9.5   GFR: Estimated Creatinine Clearance: 31.9 mL/min (by C-G formula based on SCr of 0.92 mg/dL). Liver Function Tests: Recent Labs  Lab 07/23/17 2040 07/24/17 0415  AST 41 37  ALT 16 16  ALKPHOS 96 87  BILITOT 1.2 1.2  PROT 8.4* 7.9  ALBUMIN 4.5 4.1   No results for input(s): LIPASE, AMYLASE in the last 168 hours. No results for input(s): AMMONIA in the last 168 hours. Coagulation Profile: Recent Labs  Lab 07/24/17 0924  INR 0.99   Cardiac Enzymes: Recent Labs  Lab 07/24/17 0659  TROPONINI 0.03*   BNP (last 3 results) No results for input(s): PROBNP in the last 8760 hours. HbA1C: Recent Labs    07/24/17 0659  HGBA1C 5.1   CBG: Recent Labs  Lab 07/24/17 2136 07/25/17 0733 07/25/17 1114 07/25/17 2131 07/26/17 0602  GLUCAP 124* 102* 122* 119* 120*   Lipid Profile: Recent Labs    07/25/17 2359  CHOL 181  HDL 40*  LDLCALC 117*  TRIG 120  CHOLHDL 4.5   Thyroid Function Tests: Recent Labs    07/24/17 0419 07/24/17 0659  TSH 0.260*  --   FREET4  --  0.95  T3FREE  --  2.2   Anemia Panel: Recent Labs    07/24/17 0659  VITAMINB12 1,254*  FOLATE 23.6  FERRITIN 188  TIBC 265  IRON 42  RETICCTPCT 2.5      Radiology Studies: I have reviewed all of the imaging during this hospital visit personally     Scheduled Meds: . diphenhydrAMINE  25 mg Intravenous Q0600  . hydrochlorothiazide  25 mg Oral Daily  . lisinopril  10 mg Oral Daily  . pantoprazole  40 mg Oral QHS  . potassium chloride  20 mEq Oral BID  . senna-docusate  1 tablet Oral BID  . topiramate  25 mg Oral BID   Continuous Infusions:   LOS: 2 days         Mauricio Annett Gula, MD [facility] Pager [phone]

## 2017-07-26 NOTE — Evaluation (Signed)
Physical Therapy Evaluation Patient Details Name: Diane Berry MRN: [id number] DOB: 04/13/1961 Today's Date: 07/26/2017   History of Present Illness  57 y.o. female admitted for generalized body ache, HA, fever, chills, nausea, and vomiting. Pt hypertensive with imaging postive for a right lateral intraventricular hemorrhage. No pertininent PMH.   Clinical Impression  Pt presents with overall decrease in functional mobility secondary to above including decreased balance, decreased mobility, decreased safety awareness, and generalized bil LE weakness. Bed mobility and transfers supervision. Ambulation min HHA with wrap around assist due to instability with increased posterior and lateral sway with multiple increased physical assist required to prevent fall.  Pt PLOF independent. Pt's daughter translated throughout session as pt only speaks [place]. Pt to benefit from continued acute PT to maximize safety, functional mobility, and independence prior to d/c home with HHPT.     Follow Up Recommendations Home health PT;Supervision for mobility/OOB    Equipment Recommendations  None recommended by PT    Recommendations for Other Services       Precautions / Restrictions Precautions Precautions: Fall Restrictions Weight Bearing Restrictions: No      Mobility  Bed Mobility Overal bed mobility: Needs Assistance Bed Mobility: Supine to Sit     Supine to sit: Supervision     General bed mobility comments: supervision for safety  Transfers Overall transfer level: Needs assistance Equipment used: None Transfers: Sit to/from Stand Sit to Stand: Min assist         General transfer comment: min assist to stand due to instability, initial LOB posteriorly with increased lateral sway  Ambulation/Gait Ambulation/Gait assistance: Min assist Ambulation Distance (Feet): 180 Feet Assistive device: 1 person hand held assist(with wrap around assist from gait belt) Gait  Pattern/deviations: Step-through pattern;Decreased stride length;Staggering left;Staggering right Gait velocity: decreased Gait velocity interpretation: Below normal speed for age/gender General Gait Details: instability noted throughout, especially with head turns requiring increased physical assist to prevent fall  Stairs            Wheelchair Mobility    Modified Rankin (Stroke Patients Only) Modified Rankin (Stroke Patients Only) Pre-Morbid Rankin Score: No symptoms Modified Rankin: Slight disability     Balance Overall balance assessment: Needs assistance Sitting-balance support: Bilateral upper extremity supported;Feet unsupported Sitting balance-Leahy Scale: Fair     Standing balance support: Single extremity supported;During functional activity Standing balance-Leahy Scale: Poor Standing balance comment: noted lateral and posterior sway requiring min assist for balance during static standing and ambulation                             Pertinent Vitals/Pain Pain Assessment: Faces Faces Pain Scale: Hurts even more Pain Location: head Pain Descriptors / Indicators: Aching;Headache Pain Intervention(s): Monitored during session;Limited activity within patient's tolerance;Repositioned;Patient requesting pain meds-RN notified    Home Living Family/patient expects to be discharged to:: Private residence Living Arrangements: Children Available Help at Discharge: Family Type of Home: House Home Access: Stairs to enter Entrance Stairs-Rails: None Secretary/administratorntrance Stairs-Number of Steps: 1 Home Layout: One level Home Equipment: None Additional Comments: per pt's daughter, pt active and goes to the gym regularly    Prior Function Level of Independence: Independent               Hand Dominance   Dominant Hand: Left    Extremity/Trunk Assessment   Upper Extremity Assessment Upper Extremity Assessment: Defer to OT evaluation    Lower Extremity  Assessment Lower Extremity Assessment:  RLE deficits/detail;LLE deficits/detail RLE Deficits / Details: grossly 4/5 RLE Sensation: (light touch intact) LLE Deficits / Details: grossly 4/5 LLE Sensation: (grossly in tact)       Communication   Communication: Prefers language other than English(Vietnamese)  Cognition Arousal/Alertness: Awake/alert Behavior During Therapy: WFL for tasks assessed/performed Overall Cognitive Status: Difficult to assess                                        General Comments General comments (skin integrity, edema, etc.): pt's daughter translated throughout session    Exercises     Assessment/Plan    PT Assessment Patient needs continued PT services  PT Problem List Decreased strength;Decreased balance;Decreased mobility;Decreased safety awareness;Pain       PT Treatment Interventions Stair training;Gait training;Functional mobility training;Therapeutic activities;Therapeutic exercise;Balance training;Neuromuscular re-education;Patient/family education;Manual techniques;Modalities    PT Goals (Current goals can be found in the Care Plan section)  Acute Rehab PT Goals Patient Stated Goal: to go home PT Goal Formulation: With patient/family Time For Goal Achievement: 08/09/17 Potential to Achieve Goals: Good    Frequency Min 3X/week   Barriers to discharge        Co-evaluation               AM-PAC PT "6 Clicks" Daily Activity  Outcome Measure Difficulty turning over in bed (including adjusting bedclothes, sheets and blankets)?: A Little Difficulty moving from lying on back to sitting on the side of the bed? : A Little Difficulty sitting down on and standing up from a chair with arms (e.g., wheelchair, bedside commode, etc,.)?: Unable Help needed moving to and from a bed to chair (including a wheelchair)?: A Little Help needed walking in hospital room?: A Little Help needed climbing 3-5 steps with a railing? : A  Little 6 Click Score: 16    End of Session Equipment Utilized During Treatment: Gait belt Activity Tolerance: Patient tolerated treatment well(headache present throughout session) Patient left: in bed;with call bell/phone within reach;with bed alarm set;with family/visitor present Nurse Communication: Mobility status PT Visit Diagnosis: Unsteadiness on feet (R26.81);Other abnormalities of gait and mobility (R26.89)    Time: 1610-9604 PT Time Calculation (min) (ACUTE ONLY): 23 min   Charges:   PT Evaluation $PT Eval Moderate Complexity: 1 Mod PT Treatments $Gait Training: 8-22 mins   PT G Codes:       Barrie Dunker, SPT  Barrie Dunker 07/26/2017, 11:43 AM

## 2017-07-26 NOTE — Care Management Note (Signed)
Case Management Note  Patient Details  Name: Mechele CollinHhlek Rah Lan MRN: [id number] Date of Birth: 02/20/1961  Subjective/Objective:     Pt admitted with Hemorrhage related to HTN. She is from home with family. She does not speak [place] only [place]. Family is able to translate.  Pt does not have PCP or insurance.   Action/Plan: CM spoke to family and they are agreeable for patient to attend one of the [facility]. CM was able [facility]. Information on the AVS. CM following.  Expected Discharge Date:                  Expected Discharge Plan:  Home w Home Health Services  In-House Referral:     Discharge planning Services  CM Consult  Post Acute Care Choice:    Choice offered to:     DME Arranged:    DME Agency:     HH Arranged:    HH Agency:     Status of Service:  In process, will continue to follow  If discussed at Long Length of Stay Meetings, dates discussed:    Additional Comments:  Kermit BaloKelli F Krisha Beegle, RN 07/26/2017, 2:18 PM

## 2017-07-26 NOTE — Progress Notes (Signed)
   07/26/17 1200  Clinical Encounter Type  Visited With Patient and family together  Visit Type Follow-up  Referral From Nurse  Consult/Referral To Chaplain  Spiritual Encounters  Spiritual Needs Literature  Stress Factors  Patient Stress Factors Health changes  Family Stress Factors Health changes  Chaplain met with PT daughter, Pt does not speak [place].  The AD note in the system was auto generated and the PT has not asked for one.  PT does not speak [place] and daughter trnaslates.  Daughter is next of Kin

## 2017-07-26 NOTE — Progress Notes (Signed)
SLP Cancellation Note  Patient Details Name: Diane Berry MRN: [id number] DOB: 06/21/1960   Cancelled treatment:       Reason Eval/Treat Not Completed: Pt sleeping upon SLP arrival, and her daughter requested that SLP return at a later time to allow the pt to rest. Her daughter denies any obvious changes in cognition and communication at this time. Will f/u as able.   Diane Berry, Diane Berry 07/26/2017, 10:35 AM  Diane Berry, M.A. CCC-SLP [phone]

## 2017-07-26 NOTE — Evaluation (Signed)
Occupational Therapy Evaluation Patient Details Name: Diane Berry MRN: [id number] DOB: 08-03-1960 Today's Date: 07/26/2017    History of Present Illness 57 y.o. female admitted for generalized body ache, HA, fever, chills, nausea, and vomiting. Pt hypertensive with imaging postive for a right lateral intraventricular hemorrhage. No pertininent PMH.    Clinical Impression   This 57 y/o F presents with the above. At baseline Pt is independent with ADLs and functional mobility, very active. Pt completing room and hallway level functional mobility with MinA; currently requires MinA for toileting and LB ADLs due to decreased dynamic balance. Pt lives with daughter who reports is able to provide 24 hr supervision/assist at time of discharge. Pt will benefit from continued acute OT services to maximize her overall safety and independence with ADLs and mobility prior to return home.     Follow Up Recommendations  No OT follow up;Supervision/Assistance - 24 hour    Equipment Recommendations  Tub/shower seat           Precautions / Restrictions Precautions Precautions: Fall Restrictions Weight Bearing Restrictions: No      Mobility Bed Mobility Overal bed mobility: Needs Assistance Bed Mobility: Supine to Sit     Supine to sit: Supervision     General bed mobility comments: supervision for safety  Transfers Overall transfer level: Needs assistance Equipment used: None Transfers: Sit to/from Stand Sit to Stand: Min assist         General transfer comment: MinA to steady upon standing     Balance Overall balance assessment: Needs assistance Sitting-balance support: Bilateral upper extremity supported;Feet unsupported Sitting balance-Leahy Scale: Fair     Standing balance support: Single extremity supported;During functional activity Standing balance-Leahy Scale: Poor Standing balance comment: reliant on external support for static/dynamic balance                            ADL either performed or assessed with clinical judgement   ADL Overall ADL's : Needs assistance/impaired Eating/Feeding: Set up;Sitting   Grooming: Min guard;Standing;Wash/dry hands   Upper Body Bathing: Min guard;Sitting   Lower Body Bathing: Minimal assistance;Sit to/from stand   Upper Body Dressing : Min guard;Sitting   Lower Body Dressing: Minimal assistance;Sit to/from stand Lower Body Dressing Details (indicate cue type and reason): MinA to don slip on shoes Toilet Transfer: Minimal assistance;Ambulation;Regular Toilet   Toileting- Clothing Manipulation and Hygiene: Minimal assistance;Sit to/from stand Toileting - Clothing Manipulation Details (indicate cue type and reason): min steadying assist in standing      Functional mobility during ADLs: Minimal assistance General ADL Comments: Pt completing room and hallway functional mobility, toileting and grooming ADLs with MinA (HHA); reports some dizziness with head turns though difficult to further assess due to language barrier; educated Pt/Pt's daughter on use of DME in shower for increased safety      Vision Baseline Vision/History: No visual deficits Patient Visual Report: No change from baseline Vision Assessment?: Yes;No apparent visual deficits Ocular Range of Motion: Within Functional Limits Tracking/Visual Pursuits: Other (comment)(decreased smoothness of vertical and horizontal tracking )                Pertinent Vitals/Pain Pain Assessment: Faces Faces Pain Scale: Hurts little more Pain Location: head Pain Descriptors / Indicators: Aching;Headache Pain Intervention(s): Limited activity within patient's tolerance;Monitored during session     Hand Dominance Left   Extremity/Trunk Assessment Upper Extremity Assessment Upper Extremity Assessment: Overall WFL for tasks assessed   Lower  Extremity Assessment Lower Extremity Assessment: Defer to PT evaluation       Communication  Communication Communication: Prefers language other than English(Vietnamese )   Cognition Arousal/Alertness: Awake/alert Behavior During Therapy: WFL for tasks assessed/performed Overall Cognitive Status: Difficult to assess                                                      Home Living Family/patient expects to be discharged to:: Private residence Living Arrangements: Children Available Help at Discharge: Family Type of Home: House Home Access: Stairs to enter Secretary/administratorntrance Stairs-Number of Steps: 1 Entrance Stairs-Rails: None Home Layout: One level     Bathroom Shower/Tub: Chief Strategy OfficerTub/shower unit   Bathroom Toilet: Standard     Home Equipment: None   Additional Comments: per pt's daughter, pt active and goes to the gym regularly      Prior Functioning/Environment Level of Independence: Independent                 OT Problem List: Decreased strength;Decreased activity tolerance;Impaired balance (sitting and/or standing)      OT Treatment/Interventions: Self-care/ADL training;DME and/or AE instruction;Therapeutic activities;Balance training;Therapeutic exercise;Energy conservation;Patient/family education    OT Goals(Current goals can be found in the care plan section) Acute Rehab OT Goals Patient Stated Goal: to go home OT Goal Formulation: With patient Time For Goal Achievement: 08/09/17 Potential to Achieve Goals: Good  OT Frequency: Min 2X/week                             AM-PAC PT "6 Clicks" Daily Activity     Outcome Measure Help from another person eating meals?: None Help from another person taking care of personal grooming?: A Little Help from another person toileting, which includes using toliet, bedpan, or urinal?: A Little Help from another person bathing (including washing, rinsing, drying)?: A Little Help from another person to put on and taking off regular upper body clothing?: A Little Help from another person to put on  and taking off regular lower body clothing?: A Little 6 Click Score: 19   End of Session Equipment Utilized During Treatment: Gait belt Nurse Communication: Mobility status  Activity Tolerance: Patient tolerated treatment well Patient left: in bed  OT Visit Diagnosis: Unsteadiness on feet (R26.81)                Time: 9604-54091526-1545 OT Time Calculation (min): 19 min Charges:  OT General Charges $OT Visit: 1 Visit OT Evaluation $OT Eval Moderate Complexity: 1 Mod G-Codes:     Diane Berry, OT Pager [phone] 07/26/2017   Diane Berry 07/26/2017, 4:30 PM

## 2017-07-27 ENCOUNTER — Inpatient Hospital Stay (HOSPITAL_COMMUNITY): Payer: Medicaid Other

## 2017-07-27 HISTORY — PX: IR ANGIO VERTEBRAL SEL VERTEBRAL BILAT MOD SED: IMG5369

## 2017-07-27 HISTORY — PX: IR ANGIO INTRA EXTRACRAN SEL COM CAROTID INNOMINATE BILAT MOD SED: IMG5360

## 2017-07-27 LAB — URINALYSIS, ROUTINE W REFLEX MICROSCOPIC
BILIRUBIN URINE: NEGATIVE
GLUCOSE, UA: NEGATIVE mg/dL
Hgb urine dipstick: NEGATIVE
KETONES UR: 20 mg/dL — AB
Leukocytes, UA: NEGATIVE
NITRITE: NEGATIVE
PH: 6 (ref 5.0–8.0)
Protein, ur: NEGATIVE mg/dL
SPECIFIC GRAVITY, URINE: 1.018 (ref 1.005–1.030)

## 2017-07-27 LAB — BASIC METABOLIC PANEL
Anion gap: 12 (ref 5–15)
BUN: 30 mg/dL — AB (ref 6–20)
CALCIUM: 9.5 mg/dL (ref 8.9–10.3)
CO2: 20 mmol/L — ABNORMAL LOW (ref 22–32)
CREATININE: 1.17 mg/dL — AB (ref 0.44–1.00)
Chloride: 101 mmol/L (ref 101–111)
GFR calc Af Amer: 58 mL/min — ABNORMAL LOW (ref >60)
GFR, EST NON AFRICAN AMERICAN: 50 mL/min — AB (ref >60)
GLUCOSE: 89 mg/dL (ref 65–99)
Potassium: 4.2 mmol/L (ref 3.5–5.1)
SODIUM: 133 mmol/L — AB (ref 135–145)

## 2017-07-27 LAB — CBC
HCT: 33.3 % — ABNORMAL LOW (ref 36.0–46.0)
Hemoglobin: 10.4 g/dL — ABNORMAL LOW (ref 12.0–15.0)
MCH: 22.2 pg — ABNORMAL LOW (ref 26.0–34.0)
MCHC: 31.2 g/dL (ref 30.0–36.0)
MCV: 71.2 fL — AB (ref 78.0–100.0)
PLATELETS: 257 10*3/uL (ref 150–400)
RBC: 4.68 MIL/uL (ref 3.87–5.11)
RDW: 14.8 % (ref 11.5–15.5)
WBC: 7.1 10*3/uL (ref 4.0–10.5)

## 2017-07-27 LAB — GLUCOSE, CAPILLARY
GLUCOSE-CAPILLARY: 137 mg/dL — AB (ref 65–99)
GLUCOSE-CAPILLARY: 99 mg/dL (ref 65–99)
GLUCOSE-CAPILLARY: 89 mg/dL (ref 65–99)
Glucose-Capillary: 90 mg/dL (ref 65–99)

## 2017-07-27 LAB — PHOSPHORUS: Phosphorus: 4.1 mg/dL (ref 2.5–4.6)

## 2017-07-27 LAB — MAGNESIUM: MAGNESIUM: 2.4 mg/dL (ref 1.7–2.4)

## 2017-07-27 MED ORDER — IOPAMIDOL (ISOVUE-300) INJECTION 61%
INTRAVENOUS | Status: AC
  Administered 2017-07-27: 72 mL
  Filled 2017-07-27: qty 150

## 2017-07-27 MED ORDER — LIDOCAINE HCL (PF) 1 % IJ SOLN
INTRAMUSCULAR | Status: AC | PRN
  Administered 2017-07-27: 8 mL

## 2017-07-27 MED ORDER — FENTANYL CITRATE (PF) 100 MCG/2ML IJ SOLN
INTRAMUSCULAR | Status: AC | PRN
  Administered 2017-07-27: 25 ug via INTRAVENOUS

## 2017-07-27 MED ORDER — MIDAZOLAM HCL 2 MG/2ML IJ SOLN
INTRAMUSCULAR | Status: AC
  Filled 2017-07-27: qty 2

## 2017-07-27 MED ORDER — HEPARIN SODIUM (PORCINE) 1000 UNIT/ML IJ SOLN
INTRAMUSCULAR | Status: AC
  Filled 2017-07-27: qty 2

## 2017-07-27 MED ORDER — MIDAZOLAM HCL 2 MG/2ML IJ SOLN
INTRAMUSCULAR | Status: AC | PRN
  Administered 2017-07-27: 1 mg via INTRAVENOUS

## 2017-07-27 MED ORDER — LIDOCAINE HCL 1 % IJ SOLN
INTRAMUSCULAR | Status: AC
  Filled 2017-07-27: qty 20

## 2017-07-27 MED ORDER — FENTANYL CITRATE (PF) 100 MCG/2ML IJ SOLN
INTRAMUSCULAR | Status: AC
  Filled 2017-07-27: qty 2

## 2017-07-27 MED ORDER — SODIUM CHLORIDE 0.9 % IV SOLN
INTRAVENOUS | Status: DC
  Administered 2017-07-27 – 2017-07-28 (×2): via INTRAVENOUS

## 2017-07-27 NOTE — Progress Notes (Signed)
PROGRESS NOTE    Mechele CollinHhlek Rah Lan  AVW:[id number] DOB: 11/29/1960 DOA: 07/23/2017 PCP: System, Pcp Not In    Brief Narrative:  57 year old female who presents generalized body aches and severe headache. She does have a significant past medical history of hypertension. She had symptoms for 2 days prior to hospitalization, associated abdominal pain. On the initial physical examination blood pressure 190/158, heart rate 110, respiratory rate 31, temperature 99.8, oxygen saturation 100%. She was awake and alert, nonfocal, her lungs were clear to auscultation bilaterally, heart S1-S2 present rhythmic, her abdomen was soft nontender, no lower extremity edema. Sodium 137, potassium 3.6, chloride 106, bicarb 20, glucose 126, BUN 11, creatinine 0.76, white count 7.0, hemoglobin 11.7, hematocrit 36.6, platelets 232. TSH is 0.26, free T4 0.95, influenza negative, urinalysis greater than 30 protein, specific gravity is 1.024, white cells 6-30. CT of the abdomen with wall thickening of the descending/sigmoid colon. Head CT with acute hemorrhage in the right lateral ventricle. Hematoma volume 4.5 mL, negative for hydrocephalus. Head CT angiography no vascular malformation. Chest x-ray with right hemidiaphragm elevation, no infiltrates. EKG, sinus rhythm with 94 bpm, normal axis, normal intervals.   Patient was admitted with the working diagnosis of hypertensive emergency complicated by intracranial bleed     Assessment & Plan:   Principal Problem:   SIRS (systemic inflammatory response syndrome) (HCC) Active Problems:   Elevated blood pressure reading   ICH (intracerebral hemorrhage) (HCC)   1.Hypertensive emergency.Blood pressure continue with good control, continue combination lisinopril and hctz.   2.Intracranial bleed. For CT angiography today, patient is non focal.   3.Chronic anemia.Follow as outpatient.  Patient has been stable on combination 2 agents for blood pressure control, will  recommend to follow up as out patient within 7 days after discharge with Primary Care. Follow on renal function and electrolytes as outpatient.   Will sign off, please call with questions.    DVT prophylaxis:scd Code Status:full Family Communication:I spoke with patient's daughter at the bedside, as interpreter. All questions were addressed. Disposition Plan:home   Consultants:  Neurology   Neurosurgery  Procedures:    Antimicrobials:   Subjective: Patient feeling well, improved headache, no chest pain, no nausea or vomiting.   Objective: Vitals:   07/27/17 1355 07/27/17 1409 07/27/17 1420 07/27/17 1436  BP: 121/74 135/84 129/83 131/72  Pulse: 70 83 86 74  Resp: 17 (!) 21 19 18   Temp:    98.2 F (36.8 C)  TempSrc:    Oral  SpO2: 100% 100% 100% 100%  Weight:      Height:        Intake/Output Summary (Last 24 hours) at 07/27/2017 1506 Last data filed at 07/27/2017 1100 Gross per 24 hour  Intake 480 ml  Output 600 ml  Net -120 ml   Filed Weights   07/23/17 2032 07/24/17 0458  Weight: 54.4 kg (120 lb) 49.9 kg (110 lb 0.2 oz)    Examination:   General: Not in pain or dyspnea, mild deconditioned Neurology: Awake and alert, non focal  E ENT: no pallor, no icterus, oral mucosa moist Cardiovascular: No JVD. S1-S2 present, rhythmic, no gallops, rubs, or murmurs. No lower extremity edema. Pulmonary: vesicular breath sounds bilaterally, adequate air movement, no wheezing, rhonchi or rales. Gastrointestinal. Abdomen flat, no organomegaly, non tender, no rebound or guarding Skin. No rashes Musculoskeletal: no joint deformities     Data Reviewed: I have personally reviewed following labs and imaging studies  CBC: Recent Labs  Lab 07/23/17 2040 07/24/17 0415  07/25/17 0224 07/27/17 0409  WBC 7.0 7.6 9.9 7.1  NEUTROABS 4.4 6.4 7.6  --   HGB 11.7* 11.0* 10.9* 10.4*  HCT 36.6 35.0* 34.2* 33.3*  MCV 71.8* 72.0* 70.8* 71.2*  PLT 232 208 200 257    Basic Metabolic Panel: Recent Labs  Lab 07/23/17 2040 07/24/17 0415 07/25/17 0224 07/25/17 2359 07/27/17 0409  NA 137 137 135 131* 133*  K 3.6 3.4* 3.4* 4.0 4.2  CL 106 105 104 98* 101  CO2 20* 19* 19* 21* 20*  GLUCOSE 126* 133* 126* 136* 89  BUN 11 9 20  26* 30*  CREATININE 0.76 0.77 0.88 0.92 1.17*  CALCIUM 9.7 8.9 9.2 9.5 9.5  MG  --   --   --   --  2.4  PHOS  --   --   --   --  4.1   GFR: Estimated Creatinine Clearance: 25.1 mL/min (A) (by C-G formula based on SCr of 1.17 mg/dL (H)). Liver Function Tests: Recent Labs  Lab 07/23/17 2040 07/24/17 0415  AST 41 37  ALT 16 16  ALKPHOS 96 87  BILITOT 1.2 1.2  PROT 8.4* 7.9  ALBUMIN 4.5 4.1   No results for input(s): LIPASE, AMYLASE in the last 168 hours. No results for input(s): AMMONIA in the last 168 hours. Coagulation Profile: Recent Labs  Lab 07/24/17 0924  INR 0.99   Cardiac Enzymes: Recent Labs  Lab 07/24/17 0659  TROPONINI 0.03*   BNP (last 3 results) No results for input(s): PROBNP in the last 8760 hours. HbA1C: No results for input(s): HGBA1C in the last 72 hours. CBG: Recent Labs  Lab 07/26/17 0602 07/26/17 1112 07/26/17 1641 07/26/17 2140 07/27/17 1116  GLUCAP 120* 137* 119* 114* 99   Lipid Profile: Recent Labs    07/25/17 2359  CHOL 181  HDL 40*  LDLCALC 117*  TRIG 120  CHOLHDL 4.5   Thyroid Function Tests: No results for input(s): TSH, T4TOTAL, FREET4, T3FREE, THYROIDAB in the last 72 hours. Anemia Panel: No results for input(s): VITAMINB12, FOLATE, FERRITIN, TIBC, IRON, RETICCTPCT in the last 72 hours.    Radiology Studies: I have reviewed all of the imaging during this hospital visit personally     Scheduled Meds: . atorvastatin  40 mg Oral q1800  . diphenhydrAMINE  25 mg Intravenous Q0600  . fentaNYL      . hydrochlorothiazide  25 mg Oral Daily  . lidocaine      . lisinopril  10 mg Oral Daily  . midazolam      . pantoprazole  40 mg Oral QHS  . senna-docusate   1 tablet Oral BID  . topiramate  25 mg Oral BID   Continuous Infusions: . sodium chloride 50 mL/hr at 07/27/17 1022     LOS: 3 days        Mauricio Annett Gula, MD [facility] Pager [phone]

## 2017-07-27 NOTE — Progress Notes (Signed)
SLP Cancellation Note  Patient Details Name: Diane Berry MRN: [id number] DOB: 08/31/1960   Cancelled treatment:       Reason Eval/Treat Not Completed: Patient at procedure or test/unavailable   Rashaun Wichert 07/27/2017, 12:24 PM

## 2017-07-27 NOTE — Progress Notes (Signed)
[facility] CM [facility]. CM following.

## 2017-07-27 NOTE — Progress Notes (Signed)
OT Cancellation Note  Patient Details Name: Diane Berry MRN: [id number] DOB: 01/08/1961   Cancelled Treatment:    Reason Eval/Treat Not Completed: Patient at procedure or test/ unavailable; will follow up as schedule permits and as Pt is available.  Diane Berry, OT Pager [phone] 07/27/2017   Diane Berry 07/27/2017, 2:24 PM

## 2017-07-27 NOTE — Sedation Documentation (Signed)
5 Fr. Exoseal to right groin 

## 2017-07-27 NOTE — Progress Notes (Signed)
Physical Therapy Treatment Patient Details Name: Diane Berry MRN: [id number] DOB: 08-13-1960 Today's Date: 07/27/2017    History of Present Illness 57 y.o. female admitted for generalized body ache, HA, fever, chills, nausea, and vomiting. Pt hypertensive with imaging postive for a right lateral intraventricular hemorrhage. Angiogram planned for 2/19. No pertininent PMH.     PT Comments    Patient progressing slowly towards PT goals. Reports headache during session that worsens with ambulation and mobility. RN notified. Continues to demonstrate impaired balance- staggering to right/left requiring Min A t maintain balance. Seems distracted by pain today. Daughter has been walking with pt during the day. Less assist needed to stand from surfaces. Plan for angiogram today. Will follow.    Follow Up Recommendations  Home health PT;Supervision for mobility/OOB     Equipment Recommendations  None recommended by PT    Recommendations for Other Services       Precautions / Restrictions Precautions Precautions: Fall Restrictions Weight Bearing Restrictions: No    Mobility  Bed Mobility Overal bed mobility: Needs Assistance Bed Mobility: Supine to Sit;Sit to Supine     Supine to sit: Supervision Sit to supine: Supervision   General bed mobility comments: supervision for safety; no assist needed.   Transfers Overall transfer level: Needs assistance Equipment used: None Transfers: Sit to/from Stand Sit to Stand: Min guard         General transfer comment: Min guard for safety. Stood from [employer], from toilet x1.   Ambulation/Gait Ambulation/Gait assistance: Min assist Ambulation Distance (Feet): 200 Feet Assistive device: None Gait Pattern/deviations: Step-through pattern;Decreased stride length;Staggering left;Staggering right;Narrow base of support Gait velocity: decreased   General Gait Details: Slow, unsteady gait with veering towards right and then left, staggering  present as well esp with head turns. Min A needed for stability.    Stairs            Wheelchair Mobility    Modified Rankin (Stroke Patients Only) Modified Rankin (Stroke Patients Only) Pre-Morbid Rankin Score: No symptoms Modified Rankin: Slight disability     Balance Overall balance assessment: Needs assistance Sitting-balance support: Feet supported;No upper extremity supported Sitting balance-Leahy Scale: Fair     Standing balance support: During functional activity Standing balance-Leahy Scale: Fair Standing balance comment: Able to stand at sink and wash hands with Min guard assist for safety.                            Cognition Arousal/Alertness: Awake/alert Behavior During Therapy: WFL for tasks assessed/performed Overall Cognitive Status: Difficult to assess                                 General Comments: Pt distracted by pain during session.      Exercises      General Comments General comments (skin integrity, edema, etc.): pt's daughter translated during session.      Pertinent Vitals/Pain Pain Assessment: Faces Faces Pain Scale: Hurts whole lot Pain Location: head Pain Descriptors / Indicators: Aching;Headache Pain Intervention(s): Monitored during session;Limited activity within patient's tolerance;Patient requesting pain meds-RN notified    Home Living                      Prior Function            PT Goals (current goals can now be found in the care plan section)  Progress towards PT goals: Progressing toward goals    Frequency    Min 3X/week      PT Plan Current plan remains appropriate    Co-evaluation              AM-PAC PT "6 Clicks" Daily Activity  Outcome Measure  Difficulty turning over in bed (including adjusting bedclothes, sheets and blankets)?: None Difficulty moving from lying on back to sitting on the side of the bed? : None Difficulty sitting down on and standing up  from a chair with arms (e.g., wheelchair, bedside commode, etc,.)?: None Help needed moving to and from a bed to chair (including a wheelchair)?: A Little Help needed walking in hospital room?: A Little Help needed climbing 3-5 steps with a railing? : A Little 6 Click Score: 21    End of Session Equipment Utilized During Treatment: Gait belt Activity Tolerance: Patient limited by pain Patient left: in bed;with call bell/phone within reach;with bed alarm set;with family/visitor present Nurse Communication: Mobility status PT Visit Diagnosis: Unsteadiness on feet (R26.81);Other abnormalities of gait and mobility (R26.89)     Time: 0945-1000 PT Time Calculation (min) (ACUTE ONLY): 15 min  Charges:  $Gait Training: 8-22 mins                    G Codes:       Mylo RedShauna Alyce Inscore, PT, DPT [phone]     Blake DivineShauna A Gavina Dildine 07/27/2017, 10:33 AM

## 2017-07-27 NOTE — Progress Notes (Signed)
STROKE TEAM PROGRESS NOTE   HISTORY OF PRESENT ILLNESS (per record) Diane Berry is a 57 y.o. female [place] who presents with 3 day history of headache, neck pain, and generalized body aches. Patient also has been having multiple complaints including abdominal discomfort. BP is 190 systolic on arrival. Patient admitted to medicine service from ER for SIRS workup and started on broad spectrum antibiotics.   After admission and assessment by hospitalist MD, a stat CT Head was ordered which showed right lateral Intraventricular hemorrhage. Patient rceived IV hydralazine and blood pressure was brought below 140 systolic. Neurology was consulted immediately.   On assessment at 8 am patient alert, no evidence of weakness. Complaing of severe headache and neck stiffness. Slightly confused per daughter. Patient's daughter provided [place] translation.  Date last known well: 3 days ago  tPA Given: no, ICH NIHSS: 0 Baseline MRS 0  SUBJECTIVE (INTERVAL HISTORY) Her daughter is at bedside and acts as interpreter. Headache is improved and denies any neck pain today on exam. Voices no new complaints. Scheduled for Angiogram later today. Fever overnight, now 99.7. WBC normal, U/A negative. Will continue to monitor closely. Likely reactive. . Cerebral catheter angiogram shows no evidence of aneurysm, AVM. Multifocal intracranial atherosclerotic changes noted  OBJECTIVE Temp:  [99 F (37.2 C)-100 F (37.8 C)] 99.7 F (37.6 C) (02/19 1130) Pulse Rate:  [75-98] 84 (02/19 1130) Cardiac Rhythm: Normal sinus rhythm (02/19 0700) Resp:  [18] 18 (02/19 1130) BP: (95-126)/(62-74) 112/62 (02/19 1130) SpO2:  [97 %-100 %] 100 % (02/19 1130)  CBC:  Recent Labs  Lab 07/24/17 0415 07/25/17 0224 07/27/17 0409  WBC 7.6 9.9 7.1  NEUTROABS 6.4 7.6  --   HGB 11.0* 10.9* 10.4*  HCT 35.0* 34.2* 33.3*  MCV 72.0* 70.8* 71.2*  PLT 208 200 257    Basic Metabolic Panel:  Recent Labs  Lab 07/25/17 2359  07/27/17 0409  NA 131* 133*  K 4.0 4.2  CL 98* 101  CO2 21* 20*  GLUCOSE 136* 89  BUN 26* 30*  CREATININE 0.92 1.17*  CALCIUM 9.5 9.5  MG  --  2.4  PHOS  --  4.1    Lipid Panel:     Component Value Date/Time   CHOL 181 07/25/2017 2359   TRIG 120 07/25/2017 2359   HDL 40 (L) 07/25/2017 2359   CHOLHDL 4.5 07/25/2017 2359   VLDL 24 07/25/2017 2359   LDLCALC 117 (H) 07/25/2017 2359   HgbA1c:  Lab Results  Component Value Date   HGBA1C 5.1 07/24/2017   Urine Drug Screen: No results found for: LABOPIA, COCAINSCRNUR, LABBENZ, AMPHETMU, THCU, LABBARB  Alcohol Level No results found for: ETH  IMAGING  Ct Angio Head W Or Wo Contrast Ct Angio Neck W Or Wo Contrast 07/24/2017 IMPRESSION:  Intraventricular hemorrhage right lateral ventricle unchanged.  No underlying vascular malformation. Hemorrhage is presumably hypertensive.  Severe intracranial atherosclerotic disease. Moderate calcific stenosis in the cavernous carotid bilaterally.  Severe calcific stenosis distal vertebral artery bilaterally.  Extensive atherosclerotic disease throughout the anterior, middle, and posterior cerebral arteries without large vessel occlusion.  Calcific stenosis in the carotid bifurcation bilaterally. No significant stenosis right carotid.  Less than 25% diameter stenosis left internal carotid artery.   Dg Chest 2 View 07/23/2017 IMPRESSION:  No active cardiopulmonary disease.   Ct Head Wo Contrast 07/24/2017 IMPRESSION:  Unchanged right lateral intraventricular hemorrhage.  No hydrocephalus or new intracranial abnormality.   Ct Head Wo Contrast 07/24/2017 IMPRESSION:  Acute hemorrhage in the  right lateral ventricle. Hematoma volume 4.5 mL. Negative for hydrocephalus. No underlying mass identified. CTA head recommended to evaluate for vascular malformation. Intracranial atherosclerotic disease with chronic microvascular ischemia. No acute ischemic infarction.   Mr Laqueta Jean Wo Contrast Mr  Cervical Spine W Wo Contrast 07/24/2017 IMPRESSION:  1. Unchanged right lateral intraventricular hemorrhage. Small volume blood products in the left lateral and fourth ventricles. No hydrocephalus.  2. No acute infarct or mass identified.  3. Markedly age advanced chronic small vessel ischemic disease with multiple chronic lacunar infarcts as above. 4. Numerous chronic microhemorrhages favoring chronic hypertension.   Mr Cervical Spine W Wo Contrast 07/24/2017 Motion degraded cervical spine MRI. C5-6 disc degeneration with mild-to-moderate spinal stenosis. Tiny C6-7 disc protrusion without stenosis.  Ct Abdomen Pelvis W Contrast 07/24/2017 IMPRESSION:  1. Equivocal wall thickening of the descending/sigmoid colon junction versus more likely nondistention.  2.  Aortic Atherosclerosis (ICD10-I70.0).  3. Too small to characterize bilateral renal lesions, likely cysts.   US Renal 07/24/2017 IMPRESSION:  Mild ectasia of the renal calices bilaterally. No obstructive uropathy.  No sonographic findings to explain the patient's proteinuria.   PHYSICAL EXAM Vitals:   07/27/17 0044 07/27/17 0321 07/27/17 0800 07/27/17 1130  BP: 103/66 95/68 126/71 112/62  Pulse: 98 84 75 84  Resp: 18 18 18 18   Temp: 100 F (37.8 C) 99.3 F (37.4 C) 99.1 F (37.3 C) 99.7 F (37.6 C)  TempSrc: Oral Oral Oral Oral  SpO2: 98% 98% 97% 100%  Weight:      Height:      Cerebral catheter angio 07/27/2017 : 1No angio evidence of AVM,AVF DAVF or of aneurysms . 2.Approx 50 % stenosis of LT VBJ,Lt ICA supraclinoid seg 3.Approx 70 % stenosis of RT VBJ(non dominant) prox  And distal to the RT PICA 4.Approx 50 to 70 Stenosis of LT PCA     PHYSICAL EXAM Physical exam: Exam: Gen: NAD Eyes: anicteric sclerae, moist conjunctivae                    CV: no MRG, no carotid bruits, no peripheral edema Mental Status: Alert, follows commands, good historian  Neuro: Detailed Neurologic Exam  Speech:    No aphasia, no  dysarthria  Cranial Nerves:    The pupils are equal, round, and reactive to light.. Attempted, Fundi not visualized.  EOMI. No gaze preference. Visual fields full. Face symmetric, Tongue midline. Hearing intact to voice. Shoulder shrug intact  Motor Observation:    no involuntary movements noted. Tone appears normal.     Strength:    Able to move all 4 extremities against gravity. No focal weakness    Sensation:  Intact to LT  Plantars equiv.   ASSESSMENT/PLAN Diane Berry is a 57 y.o. female with no significant past medical history presenting with a 3 day history of headache, neck pain, abdominal pain, and generalized body aches. She did not receive IV t-PA due to ICH.   ICH:  Felt secondary to uncontrolled hypertension.   Resultant  Non focal exam, patient has headache  CT head - Acute hemorrhage in the right lateral ventricle. Hematoma volume 4.5 mL.  MRI head - Unchanged right lateral intraventricular hemorrhage. Multiple chronic lacunar infarcts.  Neurosurgery following, no need for surgical intervention at this time. Appreciate Assistance  MRA head - diffuse cerebrovascular disease.  MRI C spine - mild-to-moderate spinal stenosis. Tiny C6-7 disc protrusion without stenosis.  CTA H&N - Severe intracranial atherosclerotic disease.   Cerebral  Angiogram scheduled for 07/27/2017 with Dr Drusilla Kannereveshawar to look for  AVM source of her hemorrhage.    Carotid Doppler - CTA neck  2D Echo - PENDING  LDL - 117  HgbA1c - 5.1  VTE prophylaxis - SCDs Fall precautions Diet NPO time specified Except for: Sips with Meds  No antithrombotic prior to admission, now on No antithrombotic  Ongoing aggressive stroke risk factor management  Therapy recommendations:  HH PT  Disposition:  Likely home in AM  Hypertension  Stable, some hypotension overnight  Permissive hypertension (OK if < 220/120) but gradually normalize in 5-7 days  Long-term BP goal  normotensive Hopsitalists following medical issues, Appreciate assistance HCTZ and Lisinopril in progress  Hyperlipidemia  Home meds: No lipid lowering medications prior to admission  LDL 117, goal < 70  Lipitor 40 mg daily added  Other Stroke Risk Factors  Obesity, Body mass index is 33.57 kg/m., recommend weight loss, diet and exercise as appropriate   Previous strokes by imaging  Other Active Problems  Severe intracranial atherosclerotic disease.  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 Tiny C6-7 disc protrusion without stenosis.  Anemia - stable, will continue to monitor closely  Mild hypokalemia - 4.2,  on diuretic -> supplement in progress  Proteinuria - renal US unremarkable  Headache -  trial of Topamax 25 minute and twice daily and Fioricet PRN  AKI - IVF in progress  Fever -  TMax 100.0 overnight, WBC 7.1, U/A negative, will continue to monitor  Hyponatremia - IVF in progress, repeat labs in AM  Hospital day # 3   Diane Berry, ANP-C Stroke Neurology Team 07/26/2017 2:49 PM I have personally examined this patient, reviewed notes, independently viewed imaging studies, participated in medical decision making and plan of care.ROS completed by me personally and pertinent positives fully documented  I have made any additions or clarifications directly to the above note.  Check echocardiogram. Likely discharge home tomorrow morning with home therapies. Discussed with patient and daughter and Dr. Corliss Skainseveshwar. Greater than 50% time during this 25 minute visit was spent on counseling and coordination of care about her interest to hemorrhage and answered questions Delia HeadyPramod Sethi, MD Medical Director [facility] Pager: [phone] 07/27/2017 3:23 PM ' To contact Stroke Continuity provider, please refer to [email or web address]. After hours, contact General Neurology

## 2017-07-27 NOTE — Progress Notes (Signed)
PA spoke with daughter, brother, and patient in the room with assistance from RN.  Risks and benefits were discussed with the patient including, but not limited to bleeding, infection, vascular injury or contrast induced renal failure.  This interventional procedure involves the use of X-rays and because of the nature of the planned procedure, it is possible that we will have prolonged use of X-ray fluoroscopy.  Potential radiation risks to you include (but are not limited to) the following: - A slightly elevated risk for cancer  several years later in life. This risk is typically less than 0.5% percent. This risk is low in comparison to the normal incidence of human cancer, which is 33% for women and 50% for men according to the [employer]. - Radiation induced injury can include skin redness, resembling a rash, tissue breakdown / ulcers and hair loss (which can be temporary or permanent).   The likelihood of either of these occurring depends on the difficulty of the procedure and whether you are sensitive to radiation due to previous procedures, disease, or genetic conditions.   IF your procedure requires a prolonged use of radiation, you will be notified and given written instructions for further action.  It is your responsibility to monitor the irradiated area for the 2 weeks following the procedure and to notify your physician if you are concerned that you have suffered a radiation induced injury.    All of the patient's questions were answered, patient is agreeable to proceed.  Consent signed and in chart.  Loyce DysKacie Ashelynn Marks, MS RD PA-C

## 2017-07-27 NOTE — Progress Notes (Signed)
STROKE TEAM PROGRESS NOTE   HISTORY OF PRESENT ILLNESS (per record) Diane Berry is a 57 y.o. female [place] who presents with 3 day history of headache, neck pain, and generalized body aches. Patient also has been having multiple complaints including abdominal discomfort. BP is 190 systolic on arrival. Patient admitted to medicine service from ER for SIRS workup and started on broad spectrum antibiotics.   After admission and assessment by hospitalist MD, a stat CT Head was ordered which showed right lateral Intraventricular hemorrhage. Patient rceived IV hydralazine and blood pressure was brought below 140 systolic. Neurology was consulted immediately.   On assessment at 8 am patient alert, no evidence of weakness. Complaing of severe headache and neck stiffness. Slightly confused per daughter. Patient's daughter provided [place] translation.  Date last known well: 3 days ago  tPA Given: no, ICH NIHSS: 0 Baseline MRS 0  SUBJECTIVE (INTERVAL HISTORY) Her daughter is at bedside and acts as interpreter. Headache is improved and denies any neck pain today on exam. Voices no new complaints. Scheduled for Angiogram later today. Fever overnight, now 99.7. WBC normal, U/A negative. Will continue to monitor closely. Likely reactive.   OBJECTIVE Temp:  [98.2 F (36.8 C)-100 F (37.8 C)] 98.2 F (36.8 C) (02/19 1436) Pulse Rate:  [70-98] 74 (02/19 1436) Cardiac Rhythm: Normal sinus rhythm (02/19 1420) Resp:  [16-28] 18 (02/19 1436) BP: (95-143)/(62-84) 131/72 (02/19 1436) SpO2:  [97 %-100 %] 100 % (02/19 1436)  CBC:  Recent Labs  Lab 07/24/17 0415 07/25/17 0224 07/27/17 0409  WBC 7.6 9.9 7.1  NEUTROABS 6.4 7.6  --   HGB 11.0* 10.9* 10.4*  HCT 35.0* 34.2* 33.3*  MCV 72.0* 70.8* 71.2*  PLT 208 200 257    Basic Metabolic Panel:  Recent Labs  Lab 07/25/17 2359 07/27/17 0409  NA 131* 133*  K 4.0 4.2  CL 98* 101  CO2 21* 20*  GLUCOSE 136* 89  BUN 26* 30*  CREATININE  0.92 1.17*  CALCIUM 9.5 9.5  MG  --  2.4  PHOS  --  4.1    Lipid Panel:     Component Value Date/Time   CHOL 181 07/25/2017 2359   TRIG 120 07/25/2017 2359   HDL 40 (L) 07/25/2017 2359   CHOLHDL 4.5 07/25/2017 2359   VLDL 24 07/25/2017 2359   LDLCALC 117 (H) 07/25/2017 2359   HgbA1c:  Lab Results  Component Value Date   HGBA1C 5.1 07/24/2017   Urine Drug Screen: No results found for: LABOPIA, COCAINSCRNUR, LABBENZ, AMPHETMU, THCU, LABBARB  Alcohol Level No results found for: ETH  IMAGING  Ct Angio Head W Or Wo Contrast Ct Angio Neck W Or Wo Contrast 07/24/2017 IMPRESSION:  Intraventricular hemorrhage right lateral ventricle unchanged.  No underlying vascular malformation. Hemorrhage is presumably hypertensive.  Severe intracranial atherosclerotic disease. Moderate calcific stenosis in the cavernous carotid bilaterally.  Severe calcific stenosis distal vertebral artery bilaterally.  Extensive atherosclerotic disease throughout the anterior, middle, and posterior cerebral arteries without large vessel occlusion.  Calcific stenosis in the carotid bifurcation bilaterally. No significant stenosis right carotid.  Less than 25% diameter stenosis left internal carotid artery.   Dg Chest 2 View 07/23/2017 IMPRESSION:  No active cardiopulmonary disease.   Ct Head Wo Contrast 07/24/2017 IMPRESSION:  Unchanged right lateral intraventricular hemorrhage.  No hydrocephalus or new intracranial abnormality.   Ct Head Wo Contrast 07/24/2017 IMPRESSION:  Acute hemorrhage in the right lateral ventricle. Hematoma volume 4.5 mL. Negative for hydrocephalus. No underlying mass identified.  CTA head recommended to evaluate for vascular malformation. Intracranial atherosclerotic disease with chronic microvascular ischemia. No acute ischemic infarction.   Mr Laqueta JeanBrain W Wo Contrast Mr Cervical Spine W Wo Contrast 07/24/2017 IMPRESSION:  1. Unchanged right lateral intraventricular hemorrhage.  Small volume blood products in the left lateral and fourth ventricles. No hydrocephalus.  2. No acute infarct or mass identified.  3. Markedly age advanced chronic small vessel ischemic disease with multiple chronic lacunar infarcts as above. 4. Numerous chronic microhemorrhages favoring chronic hypertension.   Mr Cervical Spine W Wo Contrast 07/24/2017 Motion degraded cervical spine MRI. C5-6 disc degeneration with mild-to-moderate spinal stenosis. Tiny C6-7 disc protrusion without stenosis.  Ct Abdomen Pelvis W Contrast 07/24/2017 IMPRESSION:  1. Equivocal wall thickening of the descending/sigmoid colon junction versus more likely nondistention.  2.  Aortic Atherosclerosis (ICD10-I70.0).  3. Too small to characterize bilateral renal lesions, likely cysts.   [place] Renal 07/24/2017 IMPRESSION:  Mild ectasia of the renal calices bilaterally. No obstructive uropathy.  No sonographic findings to explain the patient's proteinuria.   PHYSICAL EXAM Vitals:   07/27/17 1355 07/27/17 1409 07/27/17 1420 07/27/17 1436  BP: 121/74 135/84 129/83 131/72  Pulse: 70 83 86 74  Resp: 17 (!) 21 19 18   Temp:    98.2 F (36.8 C)  TempSrc:    Oral  SpO2: 100% 100% 100% 100%  Weight:      Height:        PHYSICAL EXAM Physical exam: Exam: Gen: NAD Eyes: anicteric sclerae, moist conjunctivae                    CV: no MRG, no carotid bruits, no peripheral edema Mental Status: Alert, follows commands, good historian  Neuro: Detailed Neurologic Exam  Speech:    No aphasia, no dysarthria  Cranial Nerves:    The pupils are equal, round, and reactive to light.. Attempted, Fundi not visualized.  EOMI. No gaze preference. Visual fields full. Face symmetric, Tongue midline. Hearing intact to voice. Shoulder shrug intact  Motor Observation:    no involuntary movements noted. Tone appears normal.     Strength:    Able to move all 4 extremities against gravity. No focal weakness    Sensation:  Intact  to LT  Plantars equiv.   ASSESSMENT/PLAN Ms. Diane Rah Daryl EasternLan is a 57 y.o. female with no significant past medical history presenting with a 3 day history of headache, neck pain, abdominal pain, and generalized body aches. She did not receive IV t-PA due to ICH.   ICH:  Felt secondary to uncontrolled hypertension.   Resultant  Non focal exam, patient has headache  CT head - Acute hemorrhage in the right lateral ventricle. Hematoma volume 4.5 mL.  MRI head - Unchanged right lateral intraventricular hemorrhage. Multiple chronic lacunar infarcts.  Neurosurgery following, no need for surgical intervention at this time. Appreciate Assistance  MRA head - diffuse cerebrovascular disease.  MRI C spine - mild-to-moderate spinal stenosis. Tiny C6-7 disc protrusion without stenosis.  CTA H&N - Severe intracranial atherosclerotic disease.   Cerebral Angiogram scheduled for 07/27/2017 with Dr Drusilla Kannereveshawar to look for  AVM source of her hemorrhage.    Carotid Doppler - CTA neck  2D Echo - PENDING  LDL - 117  HgbA1c - 5.1  VTE prophylaxis - SCDs Fall precautions Diet Heart Room service appropriate? Yes; Fluid consistency: Thin  No antithrombotic prior to admission, now on No antithrombotic  Ongoing aggressive stroke risk factor management  Therapy recommendations:  HH PT  Disposition:  Likely home in AM  Hypertension  Stable, some hypotension overnight  Permissive hypertension (OK if < 220/120) but gradually normalize in 5-7 days  Long-term BP goal normotensive Hopsitalists following medical issues, Appreciate assistance HCTZ and Lisinopril in progress  Hyperlipidemia  Home meds: No lipid lowering medications prior to admission  LDL 117, goal < 70  Lipitor 40 mg daily added  Other Stroke Risk Factors  Obesity, Body mass index is 33.57 kg/m., recommend weight loss, diet and exercise as appropriate   Previous strokes by imaging  Other Active Problems  Severe  intracranial atherosclerotic disease.  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 Tiny C6-7 disc protrusion without stenosis.  Anemia - stable, will continue to monitor closely  Mild hypokalemia - 4.2,  on diuretic -> supplement in progress  Proteinuria - renal US unremarkable  Headache -  trial of Topamax 25 minute and twice daily and Fioricet PRN  AKI - IVF in progress  Fever -  TMax 100.0 overnight, WBC 7.1, U/A negative, will continue to monitor  Hyponatremia - IVF in progress, repeat labs in AM  Hospital day # 3   Brita Romp Stroke Neurology Team 07/26/2017 2:49 PM I have personally examined this patient, reviewed notes, independently viewed imaging studies, participated in medical decision making and plan of care.ROS completed by me personally and pertinent positives fully documented  I have made any additions or clarifications directly to the above note. Agree with note above.    Delia Heady, MD Medical Director [facility] Pager: [phone] 07/27/2017 3:21 PM  To contact Stroke Continuity provider, please refer to [email or web address]. After hours, contact General Neurology

## 2017-07-27 NOTE — Progress Notes (Addendum)
Pt returned from IR, daughter at bedside. VSS. Pt/daughter aware of bedrest for 3 hours. Right groin surgical site CDI. No other distress or concern voice. Will continue to monitor.   Hav. RN

## 2017-07-27 NOTE — Procedures (Signed)
S/P 4 vessel cerebral arteriogram. RT CFA approach. Findings.  1No angio evidence of AVM,AVF DAVF or of aneurysms . 2.Approx 50 % stenosis of LT VBJ,Lt ICA supraclinoid seg 3.Approx 70 % stenosis of RT VBJ(non dominant) prox  And distal to the RT PICA 4.Approx 50 to 70 Stenosis of LT PCA

## 2017-07-28 ENCOUNTER — Inpatient Hospital Stay (HOSPITAL_COMMUNITY): Payer: Medicaid Other

## 2017-07-28 DIAGNOSIS — I341 Nonrheumatic mitral (valve) prolapse: Secondary | ICD-10-CM

## 2017-07-28 LAB — ECHOCARDIOGRAM COMPLETE
Height: 48 in
Weight: 1760.15 oz

## 2017-07-28 LAB — BASIC METABOLIC PANEL
Anion gap: 11 (ref 5–15)
BUN: 26 mg/dL — AB (ref 6–20)
CO2: 19 mmol/L — ABNORMAL LOW (ref 22–32)
CREATININE: 1.02 mg/dL — AB (ref 0.44–1.00)
Calcium: 9.3 mg/dL (ref 8.9–10.3)
Chloride: 106 mmol/L (ref 101–111)
GFR calc Af Amer: >60 mL/min (ref >60)
GFR, EST NON AFRICAN AMERICAN: 59 mL/min — AB (ref >60)
GLUCOSE: 85 mg/dL (ref 65–99)
POTASSIUM: 3.9 mmol/L (ref 3.5–5.1)
Sodium: 136 mmol/L (ref 135–145)

## 2017-07-28 LAB — CBC
HCT: 32.4 % — ABNORMAL LOW (ref 36.0–46.0)
Hemoglobin: 10.1 g/dL — ABNORMAL LOW (ref 12.0–15.0)
MCH: 22.5 pg — AB (ref 26.0–34.0)
MCHC: 31.2 g/dL (ref 30.0–36.0)
MCV: 72.2 fL — AB (ref 78.0–100.0)
PLATELETS: 248 10*3/uL (ref 150–400)
RBC: 4.49 MIL/uL (ref 3.87–5.11)
RDW: 15.3 % (ref 11.5–15.5)
WBC: 5.8 10*3/uL (ref 4.0–10.5)

## 2017-07-28 LAB — GLUCOSE, CAPILLARY: Glucose-Capillary: 82 mg/dL (ref 65–99)

## 2017-07-28 MED ORDER — HYDROCHLOROTHIAZIDE 25 MG PO TABS: 25.0000 mg | ORAL_TABLET | Freq: Every day | ORAL | 1 refills | Status: DC

## 2017-07-28 MED ORDER — TOPIRAMATE 25 MG PO TABS: 50.0000 mg | ORAL_TABLET | Freq: Every evening | ORAL | Status: DC

## 2017-07-28 MED ORDER — ATORVASTATIN CALCIUM 40 MG PO TABS: 40.0000 mg | ORAL_TABLET | Freq: Every day | ORAL | 1 refills | Status: DC

## 2017-07-28 MED ORDER — TOPIRAMATE 50 MG PO TABS: 50.0000 mg | ORAL_TABLET | Freq: Every evening | ORAL | 1 refills | Status: DC

## 2017-07-28 MED ORDER — BUTALBITAL-APAP-CAFFEINE 50-325-40 MG PO TABS: 1.0000 | ORAL_TABLET | Freq: Four times a day (QID) | ORAL | 0 refills | Status: DC | PRN

## 2017-07-28 MED ORDER — TOPIRAMATE 25 MG PO TABS: 25.0000 mg | ORAL_TABLET | Freq: Every day | ORAL | Status: DC

## 2017-07-28 MED ORDER — LISINOPRIL 10 MG PO TABS: 10.0000 mg | ORAL_TABLET | Freq: Every day | ORAL | 1 refills | Status: DC

## 2017-07-28 MED ORDER — TOPIRAMATE 25 MG PO TABS: 25.0000 mg | ORAL_TABLET | Freq: Every day | ORAL | 1 refills | Status: DC

## 2017-07-28 MED FILL — TOPIRAMATE 50 MG TABLET: 50 | 30 days supply | Qty: 30 | Fill #0

## 2017-07-28 MED FILL — LISINOPRIL 10 MG TABS: 10 | 30 days supply | Qty: 30 | Fill #0

## 2017-07-28 MED FILL — BUTALB-ACETAMIN-CAFF 50-325: 50-325-40 | 3 days supply | Qty: 14 | Fill #0

## 2017-07-28 MED FILL — ?ATORVASTATIN 40MG TABLET: 40 | 30 days supply | Qty: 30 | Fill #0

## 2017-07-28 MED FILL — TOPIRAMATE 25 MG TABLET: 25 | 30 days supply | Qty: 30 | Fill #0

## 2017-07-28 MED FILL — HYDROCHLOROTHIAZIDE 25 MG T: 25 | 30 days supply | Qty: 30 | Fill #0

## 2017-07-28 NOTE — Progress Notes (Signed)
Occupational Therapy Treatment Patient Details Name: Diane Berry MRN: [id number] DOB: [birth year] Today's Date: 07/28/2017    History of present illness 57 y.o. female admitted for generalized body ache, HA, fever, chills, nausea, and vomiting. Pt hypertensive with imaging postive for a right lateral intraventricular hemorrhage. Angiogram planned for 2/19. No pertininent PMH.    OT comments  Pt progressing towards goals, though demonstrating increased fatigue/weakness this session. Due to increased fatigue Pt requiring consistent minA for functional mobility and ADL completion when standing at sink, requiring HOH assist and cues to follow commands and to locate grooming items on sink. Daughter present and actively engaging/assisting during session. Feel POC remains appropriate at this time. Will continue to follow acutely to progress Pt towards established OT goals.    Follow Up Recommendations  No OT follow up;Supervision/Assistance - 24 hour    Equipment Recommendations  Tub/shower seat          Precautions / Restrictions Precautions Precautions: Fall Restrictions Weight Bearing Restrictions: No       Mobility Bed Mobility Overal bed mobility: Needs Assistance Bed Mobility: Supine to Sit;Sit to Supine     Supine to sit: Supervision Sit to supine: Supervision   General bed mobility comments: supervision for safety; no assist needed.   Transfers Overall transfer level: Needs assistance Equipment used: 1 person hand held assist Transfers: Sit to/from Stand Sit to Stand: Min assist         General transfer comment: MinA to rise and steady; Pt stood from EOBx2, toilet x1; pt seeking HHA upon standing     Balance Overall balance assessment: Needs assistance Sitting-balance support: Feet supported;No upper extremity supported Sitting balance-Leahy Scale: Fair     Standing balance support: Single extremity supported;During functional activity Standing balance-Leahy  Scale: Poor Standing balance comment: Pt requiring increased assist to maintain static balance this session with pt seeking UE support during mobility and standing grooming ADLs                            ADL either performed or assessed with clinical judgement   ADL Overall ADL's : Needs assistance/impaired Eating/Feeding: Sitting   Grooming: Set up;Sitting;Wash/dry face;Minimal assistance;Moderate assistance;Wash/dry hands Grooming Details (indicate cue type and reason): setup to wash face seated EOB; Pt requiring min-mod steadying assist to stand at sink to wash hands after toileting                  Toilet Transfer: Minimal assistance;Ambulation;Regular Toilet   Toileting- Clothing Manipulation and Hygiene: Minimal assistance;Sit to/from stand Toileting - Clothing Manipulation Details (indicate cue type and reason): Pt daughter assisting with peri-care after BM      Functional mobility during ADLs: Minimal assistance General ADL Comments: Pt completing room and hallway functional mobility; toileting and grooming ADLs; Pt with increased fatigue this session requiring MinA for all mobility and ADL completion; required brief period of modA end of session when returning to bed; Pt continues to demonstrate staggered movements during ambulation      Vision   Additional Comments: difficult to assess; Pt required HOH assist to find and move arm towards soap dispenser; increased time to reach for washcloth from therapist, though due to language barrier unsure whether due to visual deficits or fatigue/weakness               Cognition Arousal/Alertness: Awake/alert Behavior During Therapy: WFL for tasks assessed/performed Overall Cognitive Status: Difficult to assess  General Comments: Pt requires HOH assist to locate grooming items when washing hands; though unsure whether due to fatigue/weakness vs cognition/following  commands; requires max multimodal cues for hand placement under therapist's arm for steadying assist as Pt attempting to place arm around therapist's waist upon standing                           Pertinent Vitals/ Pain       Pain Assessment: Faces Faces Pain Scale: Hurts whole lot Pain Location: head Pain Descriptors / Indicators: Aching;Headache Pain Intervention(s): Monitored during session;Limited activity within patient's tolerance                                                          Frequency  Min 2X/week        Progress Toward Goals  OT Goals(current goals can now be found in the care plan section)  Progress towards OT goals: Progressing toward goals  Acute Rehab OT Goals Patient Stated Goal: to go home OT Goal Formulation: With patient Time For Goal Achievement: 08/09/17 Potential to Achieve Goals: Good  Plan Discharge plan remains appropriate                    AM-PAC PT "6 Clicks" Daily Activity     Outcome Measure   Help from another person eating meals?: None Help from another person taking care of personal grooming?: A Little Help from another person toileting, which includes using toliet, bedpan, or urinal?: A Little Help from another person bathing (including washing, rinsing, drying)?: A Little Help from another person to put on and taking off regular upper body clothing?: A Little Help from another person to put on and taking off regular lower body clothing?: A Little 6 Click Score: 19    End of Session Equipment Utilized During Treatment: Gait belt  OT Visit Diagnosis: Unsteadiness on feet (R26.81)   Activity Tolerance Patient tolerated treatment well;Patient limited by fatigue   Patient Left in bed;with call bell/phone within reach;with family/visitor present   Nurse Communication Mobility status        Time: [id number] OT Time Calculation (min): 23 min  Charges: OT General Charges $OT Visit: 1  Visit OT Treatments $Self Care/Home Management : 8-22 mins  Marcy SirenBreanna Justis Closser, OT Pager [phone] 07/28/2017    Orlando PennerBreanna L Sender Rueb 07/28/2017, 11:21 AM

## 2017-07-28 NOTE — Progress Notes (Signed)
Patient left floor to ECHO at this time, RN spoke to lab and phlebotomy will drawn lab when patient return to unit.   Hav, RN

## 2017-07-28 NOTE — Progress Notes (Signed)
  Echocardiogram 2D Echocardiogram has been performed.  Diane Berry, Diane Berry 07/28/2017, 9:25 AM

## 2017-07-28 NOTE — Discharge Summary (Signed)
Stroke Discharge Summary  Patient ID: Diane Berry   MRN: [id number]      DOB: August 14, 1960  Date of Admission: 07/23/2017 Date of Discharge: 07/28/2017  Attending Physician:  Micki Riley, MD, Stroke MD Consultant(s):    Hospitalists and Neurosurgery  Patient's PCP:  System, Pcp Not In  DISCHARGE DIAGNOSIS: Intraventricular hemorrhage etiology likely hypertensive though no definite source identified Principal Problem:   SIRS (systemic inflammatory response syndrome) (HCC) Active Problems:   Elevated blood pressure reading   ICH (intracerebral hemorrhage) (HCC) Hypertension Hyperlipidemia Obesity Mild-Moderate Spinal stenosis Anemia Hypokalemia Proteinuria Headache AKI] Fever Hyponatremia  Past Medical History:  Diagnosis Date  . Stroke (HCC) 07/24/2017   intraventricular hemorrhage   History reviewed. No pertinent surgical history.  Allergies as of 07/28/2017   No Known Allergies     Medication List    STOP taking these medications   ibuprofen 200 MG tablet Commonly known as:  ADVIL,MOTRIN     TAKE these medications   acetaminophen 500 MG tablet Commonly known as:  TYLENOL Take 500 mg by mouth every 6 (six) hours as needed for mild pain, fever or headache.   atorvastatin 40 MG tablet Commonly known as:  LIPITOR Take 1 tablet (40 mg total) by mouth daily at 6 PM.   bismuth subsalicylate 262 MG/15ML suspension Commonly known as:  PEPTO BISMOL Take 30 mLs by mouth every 6 (six) hours as needed for indigestion.   butalbital-acetaminophen-caffeine 50-325-40 MG tablet Commonly known as:  FIORICET, ESGIC Take 1-2 tablets by mouth every 6 (six) hours as needed for headache.   hydrochlorothiazide 25 MG tablet Commonly known as:  HYDRODIURIL Take 1 tablet (25 mg total) by mouth daily. Start taking on:  07/29/2017   lisinopril 10 MG tablet Commonly known as:  PRINIVIL,ZESTRIL Take 1 tablet (10 mg total) by mouth daily. Start taking on:  07/29/2017    topiramate 50 MG tablet Commonly known as:  TOPAMAX Take 1 tablet (50 mg total) by mouth every evening.   topiramate 25 MG tablet Commonly known as:  TOPAMAX Take 1 tablet (25 mg total) by mouth daily. Start taking on:  07/29/2017      LABORATORY STUDIES CBC    Component Value Date/Time   WBC 5.8 07/28/2017 0923   RBC 4.49 07/28/2017 0923   HGB 10.1 (L) 07/28/2017 0923   HCT 32.4 (L) 07/28/2017 0923   PLT 248 07/28/2017 0923   MCV 72.2 (L) 07/28/2017 0923   MCH 22.5 (L) 07/28/2017 0923   MCHC 31.2 07/28/2017 0923   RDW 15.3 07/28/2017 0923   LYMPHSABS 1.9 07/25/2017 0224   MONOABS 0.4 07/25/2017 0224   EOSABS 0.0 07/25/2017 0224   BASOSABS 0.0 07/25/2017 0224   CMP    Component Value Date/Time   NA 136 07/28/2017 0923   K 3.9 07/28/2017 0923   CL 106 07/28/2017 0923   CO2 19 (L) 07/28/2017 0923   GLUCOSE 85 07/28/2017 0923   BUN 26 (H) 07/28/2017 0923   CREATININE 1.02 (H) 07/28/2017 0923   CALCIUM 9.3 07/28/2017 0923   PROT 7.9 07/24/2017 0415   ALBUMIN 4.1 07/24/2017 0415   AST 37 07/24/2017 0415   ALT 16 07/24/2017 0415   ALKPHOS 87 07/24/2017 0415   BILITOT 1.2 07/24/2017 0415   GFRNONAA 59 (L) 07/28/2017 0923   GFRAA >60 07/28/2017 0923   COAGS Lab Results  Component Value Date   INR 0.99 07/24/2017   Lipid Panel    Component Value  Date/Time   CHOL 181 07/25/2017 2359   TRIG 120 07/25/2017 2359   HDL 40 (L) 07/25/2017 2359   CHOLHDL 4.5 07/25/2017 2359   VLDL 24 07/25/2017 2359   LDLCALC 117 (H) 07/25/2017 2359   HgbA1C  Lab Results  Component Value Date   HGBA1C 5.1 07/24/2017   Urinalysis    Component Value Date/Time   COLORURINE YELLOW 07/27/2017 0732   APPEARANCEUR CLEAR 07/27/2017 0732   LABSPEC 1.018 07/27/2017 0732   PHURINE 6.0 07/27/2017 0732   GLUCOSEU NEGATIVE 07/27/2017 0732   HGBUR NEGATIVE 07/27/2017 0732   BILIRUBINUR NEGATIVE 07/27/2017 0732   KETONESUR 20 (A) 07/27/2017 0732   PROTEINUR NEGATIVE 07/27/2017 0732    NITRITE NEGATIVE 07/27/2017 0732   LEUKOCYTESUR NEGATIVE 07/27/2017 0732   Urine Drug Screen No results found for: LABOPIA, COCAINSCRNUR, LABBENZ, AMPHETMU, THCU, LABBARB  Alcohol Level No results found for: [facility]  SIGNIFICANT DIAGNOSTIC STUDIES Ct Angio Head W Or Wo Contrast Ct Angio Neck W Or Wo Contrast 07/24/2017 IMPRESSION:  Intraventricular hemorrhage right lateral ventricle unchanged.  No underlying vascular malformation. Hemorrhage is presumably hypertensive.  Severe intracranial atherosclerotic disease. Moderate calcific stenosis in the cavernous carotid bilaterally.  Severe calcific stenosis distal vertebral artery bilaterally.  Extensive atherosclerotic disease throughout the anterior, middle, and posterior cerebral arteries without large vessel occlusion.  Calcific stenosis in the carotid bifurcation bilaterally. No significant stenosis right carotid.  Less than 25% diameter stenosis left internal carotid artery.   Dg Chest 2 View 07/23/2017 IMPRESSION:  No active cardiopulmonary disease.   Ct Head Wo Contrast 07/24/2017 IMPRESSION:  Unchanged right lateral intraventricular hemorrhage.  No hydrocephalus or new intracranial abnormality.   Ct Head Wo Contrast 07/24/2017 IMPRESSION:  Acute hemorrhage in the right lateral ventricle. Hematoma volume 4.5 mL. Negative for hydrocephalus. No underlying mass identified. CTA head recommended to evaluate for vascular malformation. Intracranial atherosclerotic disease with chronic microvascular ischemia. No acute ischemic infarction.   Mr Laqueta Jean Wo Contrast Mr Cervical Spine W Wo Contrast 07/24/2017 IMPRESSION:  1. Unchanged right lateral intraventricular hemorrhage. Small volume blood products in the left lateral and fourth ventricles. No hydrocephalus.  2. No acute infarct or mass identified.  3. Markedly age advanced chronic small vessel ischemic disease with multiple chronic lacunar infarcts as above. 4. Numerous chronic  microhemorrhages favoring chronic hypertension.   Mr Cervical Spine W Wo Contrast 07/24/2017 Motion degraded cervical spine MRI. C5-6 disc degeneration with mild-to-moderate spinal stenosis. Tiny C6-7 disc protrusion without stenosis.  Ct Abdomen Pelvis W Contrast 07/24/2017 IMPRESSION:  1. Equivocal wall thickening of the descending/sigmoid colon junction versus more likely nondistention.  2.  Aortic Atherosclerosis (ICD10-I70.0).  3. Too small to characterize bilateral renal lesions, likely cysts.   US Renal 07/24/2017 IMPRESSION:  Mild ectasia of the renal calices bilaterally. No obstructive uropathy.  No sonographic findings to explain the patient's proteinuria.     HISTORY OF PRESENT ILLNESS   &   HOSPITAL COURSE Diane Berry is an 57 y.o. female [place] who presents with 3 day history of headache, neck pain, generalized body ache x 3 days. Patient also has been having multiple complaints including abdominal discomfort. BP is 190 systolic on arrival. Patient admitted to medicine service from ER for SIRS workup and started on broad spectrum antibiotics.   After admission and assessment by hospitalist MD, a stat CT Head was ordered which showed right lateral Intraventricular hemorrhage. Patient rceived IV hydralazine and blood pressure was brought below 140 systolic. Neurology was consulted immediately.  On assessment at 8 am patient alert, no evidence of weakness. Complaing of severe headache and neck stiffness. Slightly confused per daughter. Patient's daughter provided [place] translation.   Date last known well: 3 days ago  tPA Given: no, ICH NIHSS: 0 Baseline MRS 0  Intracerebral Hemorrhage (ICH) Score  Glascow Coma Score  13-15 0  Age >/= no 0  ICH volume >/= 30ml  no 0  IVH yes +1  Infratentorial origin no 0 Total:  1  ASSESSMENT/PLAN Diane Berry is a 57 y.o. female with no significant past medical history presenting with a 3 day history of  headache, neck pain,abdominal pain, andgeneralizedbody aches. She did not receive IV t-PA due to ICH.   ICH:  Felt secondary to uncontrolled hypertension.   Resultant  Non focal exam, patient has headache  CT head - Acute hemorrhage in the right lateral ventricle. Hematoma volume 4.5 mL.  MRI head - Unchanged right lateral intraventricular hemorrhage. Multiple chronic lacunar infarcts.  Neurosurgery following, no need for surgical intervention at this time. Appreciate Assistance  MRA head - diffuse cerebrovascular disease.  MRI C spine - mild-to-moderate spinal stenosis. Tiny C6-7 disc protrusion without stenosis.  CTA H&N - Severe intracranial atherosclerotic disease.   Cerebral Angiogram scheduled for 07/27/2017 with Dr Drusilla Kanner to look for  AVM source of her hemorrhage.    Carotid Doppler - CTA neck  2D Echo - PENDING  LDL - 117  HgbA1c - 5.1  VTE prophylaxis - SCDs  Fall precautions  Diet Heart Room service appropriate? Yes; Fluid consistency: Thin  No antithrombotic prior to admission, now on No antithrombotic  Ongoing aggressive stroke risk factor management  Therapy recommendations:  HH PT  Disposition:  HOME  Hypertension  Stable, some hypotension overnight              Permissive hypertension (OK if < 220/120) but gradually normalize in 5-7 days              Long-term BP goal normotensive  Hopsitalists following medical issues, Appreciate assistance  HCTZ and Lisinopril in progress  Hyperlipidemia  Home meds: No lipid lowering medications prior to admission  LDL 117, goal < 70  Lipitor 40 mg daily added  Other Stroke Risk Factors  Obesity, Body mass index is 33.57 kg/m., recommend weight loss, diet and exercise as appropriate   Previous strokes by imaging  Other Active Problems  Severe intracranial atherosclerotic disease.  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 Tiny  C6-7 disc protrusion without stenosis. PCP to monitor  Anemia - stable, PCP to monitor  Mild hypokalemia - Resolved  Proteinuria - renal US unremarkable. PCP to monitor  Headache -  Topamax 25 mg in AM and 50 mg in PM, with Fioricet PRN  AKI - IVF in progress  Fever - Resolved, Afebrile x 24 hours  Hyponatremia - Resolved  DISCHARGE EXAM Blood pressure (!) 146/74, pulse 73, temperature 98.7 F (37.1 C), temperature source Oral, resp. rate 18, height 4' (1.219 m), weight 49.9 kg (110 lb 0.2 oz), SpO2 98 %.  Gen: NAD Eyes: anicteric sclerae, moist conjunctivae                    CV: no MRG, no carotid bruits, no peripheral edema Mental Status: Alert, follows commands, good historian  Neuro: Detailed Neurologic Exam  Speech:    No aphasia, no dysarthria  Cranial Nerves:    The pupils are equal, round,  and reactive to light.. Attempted, Fundi not visualized.  EOMI. No gaze preference. Visual fields full. Face symmetric, Tongue midline. Hearing intact to voice. Shoulder shrug intact  Motor Observation:    no involuntary movements noted. Tone appears normal.     Strength:    Able to move all 4 extremities against gravity. No focal weakness    Sensation:  Intact to LT  Plantars equiv.   Discharge Diet   Fall precautions Diet Heart Room service appropriate? Yes; Fluid consistency: Thin liquids  DISCHARGE PLAN  Disposition:  HOME with HH PT  No antithrombotic and due to ICH for secondary stroke prevention.  Ongoing risk factor control by Primary Care Physician at time of discharge  Follow-up System, Pcp Not In in 2 weeks. Case Management has arranged outpatient PCP  Follow-up with Dr. Delia HeadyPramod Reva Pinkley, Stroke Clinic in 6 weeks, office to schedule an appointment.  Greater than 30 minutes were spent preparing discharge.  Beryl MeagerMary A Costello, ANP-C Stroke Neurology Team 07/27/2017 11:05 AM I have personally examined this patient, reviewed notes, independently viewed  imaging studies, participated in medical decision making and plan of care.ROS completed by me personally and pertinent positives fully documented  I have made any additions or clarifications directly to the above note. Agree with note above.  Delia HeadyPramod Alenah Sarria, MD Medical Director [facility] Pager: [phone] 07/28/2017 11:56 AM

## 2017-07-28 NOTE — Care Management Note (Signed)
Case Management Note  Patient Details  Name: Diane Berry MRN: [id number] Date of Birth: 06/23/1960  Subjective/Objective:                    Action/Plan: [facility].  [facility]. CM went over with the daughter that [facility]. She voiced understanding.  Pt has f/u with [facility].  Family to provide transportation home.    Expected Discharge Date:  07/28/17               Expected Discharge Plan:  Home w Home Health Services  In-House Referral:     Discharge planning Services  CM Consult  Post Acute Care Choice:  Home Health Choice offered to:     DME Arranged:    DME Agency:     HH Arranged:  PT HH Agency:  [facility](charity)  Status of Service:  Completed, signed off  If discussed at Long Length of Stay Meetings, dates discussed:    Additional Comments:  Kermit BaloKelli F Riham Polyakov, RN 07/28/2017, 11:33 AM

## 2017-07-28 NOTE — Progress Notes (Signed)
Discharge instructions reviewed with patient/family. All questions answered at this time. RXs given. Transport home by family.  Hav, RN 

## 2017-07-29 LAB — CULTURE, BLOOD (ROUTINE X 2)
CULTURE: NO GROWTH
CULTURE: NO GROWTH
Special Requests: ADEQUATE
Special Requests: ADEQUATE

## 2017-08-11 ENCOUNTER — Encounter (HOSPITAL_COMMUNITY): Payer: Self-pay | Admitting: Interventional Radiology

## 2017-08-17 ENCOUNTER — Encounter (INDEPENDENT_AMBULATORY_CARE_PROVIDER_SITE_OTHER): Payer: Self-pay | Admitting: Physician Assistant

## 2017-08-17 ENCOUNTER — Other Ambulatory Visit: Payer: Self-pay

## 2017-08-17 ENCOUNTER — Ambulatory Visit (INDEPENDENT_AMBULATORY_CARE_PROVIDER_SITE_OTHER): Payer: Self-pay | Admitting: Physician Assistant

## 2017-08-17 VITALS — BP 100/69 | HR 86 | Temp 97.5°F | Wt 106.6 lb

## 2017-08-17 DIAGNOSIS — K921 Melena: Secondary | ICD-10-CM

## 2017-08-17 DIAGNOSIS — R1013 Epigastric pain: Secondary | ICD-10-CM

## 2017-08-17 DIAGNOSIS — I10 Essential (primary) hypertension: Secondary | ICD-10-CM

## 2017-08-17 DIAGNOSIS — Z23 Encounter for immunization: Secondary | ICD-10-CM

## 2017-08-17 DIAGNOSIS — Z1159 Encounter for screening for other viral diseases: Secondary | ICD-10-CM

## 2017-08-17 DIAGNOSIS — Z114 Encounter for screening for human immunodeficiency virus [HIV]: Secondary | ICD-10-CM

## 2017-08-17 DIAGNOSIS — Z1231 Encounter for screening mammogram for malignant neoplasm of breast: Secondary | ICD-10-CM

## 2017-08-17 DIAGNOSIS — Z760 Encounter for issue of repeat prescription: Secondary | ICD-10-CM

## 2017-08-17 DIAGNOSIS — Z1239 Encounter for other screening for malignant neoplasm of breast: Secondary | ICD-10-CM

## 2017-08-17 DIAGNOSIS — K5900 Constipation, unspecified: Secondary | ICD-10-CM

## 2017-08-17 DIAGNOSIS — G8929 Other chronic pain: Secondary | ICD-10-CM

## 2017-08-17 MED ORDER — OMEPRAZOLE 40 MG PO CPDR: 40.0000 mg | DELAYED_RELEASE_CAPSULE | Freq: Every day | ORAL | 3 refills | Status: DC

## 2017-08-17 MED ORDER — TOPIRAMATE 25 MG PO TABS: 25.0000 mg | ORAL_TABLET | Freq: Every day | ORAL | 5 refills | Status: DC

## 2017-08-17 MED ORDER — ATORVASTATIN CALCIUM 40 MG PO TABS: 40.0000 mg | ORAL_TABLET | Freq: Every day | ORAL | 5 refills | Status: DC

## 2017-08-17 MED ORDER — POLYETHYLENE GLYCOL 3350 17 GM/SCOOP PO POWD: 17.0000 g | Freq: Every day | ORAL | 1 refills | Status: DC

## 2017-08-17 MED ORDER — HYDROCHLOROTHIAZIDE 25 MG PO TABS: 25.0000 mg | ORAL_TABLET | Freq: Every day | ORAL | 5 refills | Status: DC

## 2017-08-17 MED ORDER — LISINOPRIL 10 MG PO TABS: 10.0000 mg | ORAL_TABLET | Freq: Every day | ORAL | 5 refills | Status: DC

## 2017-08-17 MED FILL — TOPIRAMATE 25 MG TABLET: 25 | 30 days supply | Qty: 30 | Fill #0

## 2017-08-17 MED FILL — POLYETHYLENE GLYCOL 3350 PO: 28 days supply | Qty: 476 | Fill #0

## 2017-08-17 MED FILL — OMEPRAZOLE DR 40 MG CAPSULE: 40 | 30 days supply | Qty: 30 | Fill #0

## 2017-08-17 MED FILL — LISINOPRIL 10 MG TABS: 10 | 30 days supply | Qty: 30 | Fill #0

## 2017-08-17 MED FILL — HYDROCHLOROTHIAZIDE 25 MG T: 25 | 30 days supply | Qty: 30 | Fill #0

## 2017-08-17 MED FILL — ATORVASTATIN 40 MG TABLET: 40 | 30 days supply | Qty: 30 | Fill #0

## 2017-08-17 NOTE — Progress Notes (Signed)
Subjective:  Patient ID: Diane Berry, female    DOB: June 30, 1960  Age: 58 y.o. MRN: [id number]  CC: HTN and hemorrhoids  HPI Diane Berry is a 57 y.o. female with a medical history of stroke (intraventricular hemorrhage) and GERD with esophagitis presents as a new patient for management of HTN, constipation, and epigastric pain. Takes anti-hypertensives as directed and is noted to be at 100/69 mmHg today in clinic. Does not endorse side effects, CP, palpitations, SOB, HA, tingling, numbness, PND, orthopnea, claudication, presyncope, or syncope.    Patient complains of constipation and reports having a bowel movement once a week. Says stools are black in color. Also has occasional nausea and epigastric pain but is not clear as to whether or not there is an association to food. Has taken Pepto Bismal with mild relief of epigastric pain. Does not endorse any other abdominal/GI sxs.      Outpatient Medications Prior to Visit  Medication Sig Dispense Refill  . atorvastatin (LIPITOR) 40 MG tablet Take 1 tablet (40 mg total) by mouth daily at 6 PM. 30 tablet 1  . hydrochlorothiazide (HYDRODIURIL) 25 MG tablet Take 1 tablet (25 mg total) by mouth daily. 30 tablet 1  . lisinopril (PRINIVIL,ZESTRIL) 10 MG tablet Take 1 tablet (10 mg total) by mouth daily. 30 tablet 1  . topiramate (TOPAMAX) 25 MG tablet Take 1 tablet (25 mg total) by mouth daily. 30 tablet 1  . topiramate (TOPAMAX) 50 MG tablet Take 1 tablet (50 mg total) by mouth every evening. 30 tablet 1  . acetaminophen (TYLENOL) 500 MG tablet Take 500 mg by mouth every 6 (six) hours as needed for mild pain, fever or headache.    . bismuth subsalicylate (PEPTO BISMOL) 262 MG/15ML suspension Take 30 mLs by mouth every 6 (six) hours as needed for indigestion.    . butalbital-acetaminophen-caffeine (FIORICET, ESGIC) 50-325-40 MG tablet Take 1-2 tablets by mouth every 6 (six) hours as needed for headache. (Patient not taking: Reported on 08/17/2017) 14  tablet 0   No facility-administered medications prior to visit.      ROS Review of Systems  Constitutional: Negative for chills, fever and malaise/fatigue.  Eyes: Negative for blurred vision.  Respiratory: Negative for shortness of breath.   Cardiovascular: Negative for chest pain and palpitations.  Gastrointestinal: Positive for abdominal pain, constipation, melena and nausea. Negative for blood in stool, diarrhea and vomiting.  Genitourinary: Negative for dysuria and hematuria.  Musculoskeletal: Negative for joint pain and myalgias.  Skin: Negative for rash.  Neurological: Negative for tingling and headaches.  Psychiatric/Behavioral: Negative for depression. The patient is not nervous/anxious.     Objective:  BP 100/69 (BP Location: Right Arm, Patient Position: Sitting, Cuff Size: Normal)   Pulse 86   Temp (!) 97.5 F (36.4 C) (Oral)   Wt 106 lb 9.6 oz (48.4 kg)   SpO2 100%   BMI 32.53 kg/m   BP/Weight 08/17/2017 07/28/2017 07/24/2017  Systolic BP 100 146 -  Diastolic BP 69 74 -  Wt. (Lbs) 106.6 - 110.01  BMI 32.53 - 33.57      Physical Exam  Constitutional: She is oriented to person, place, and time.  Thin, frail, NAD, reserved  HENT:  Head: Normocephalic and atraumatic.  Eyes: Conjunctivae are normal. No scleral icterus.  Neck: Normal range of motion. Neck supple. No thyromegaly present.  Cardiovascular: Normal rate, regular rhythm and normal heart sounds.  Pulmonary/Chest: Effort normal and breath sounds normal. No respiratory distress. She has no  wheezes.  Abdominal: Soft. Bowel sounds are normal. She exhibits no distension and no mass. There is tenderness (mild epigastric). There is no rebound and no guarding.  No fecal impaction in the rectal vault  Genitourinary:  Genitourinary Comments: No internal or external hemorrhoid.  Musculoskeletal: She exhibits no edema.  Neurological: She is alert and oriented to person, place, and time. No cranial nerve deficit.  Coordination normal.  Skin: Skin is warm and dry. No rash noted. No erythema. No pallor.  Psychiatric: She has a normal mood and affect. Her behavior is normal. Thought content normal.  Vitals reviewed.    Assessment & Plan:   1. Hypertension, unspecified type - CBC with Differential - Comprehensive metabolic panel - lisinopril (PRINIVIL,ZESTRIL) 10 MG tablet; Take 1 tablet (10 mg total) by mouth daily.  Dispense: 30 tablet; Refill: 5 - hydrochlorothiazide (HYDRODIURIL) 25 MG tablet; Take 1 tablet (25 mg total) by mouth daily.  Dispense: 30 tablet; Refill: 5 - atorvastatin (LIPITOR) 40 MG tablet; Take 1 tablet (40 mg total) by mouth daily at 6 PM.  Dispense: 30 tablet; Refill: 5  2. Abdominal pain, chronic, epigastric - H. pylori antibody, IgG - omeprazole (PRILOSEC) 40 MG capsule; Take 1 capsule (40 mg total) by mouth daily.  Dispense: 30 capsule; Refill: 3  3. Melena - Self reported by poor historian. Will assess lab values and symptomatology on f/u.   4. Constipation, unspecified constipation type - polyethylene glycol powder (GLYCOLAX/MIRALAX) powder; Take 17 g by mouth daily.  Dispense: 3350 g; Refill: 1  5. Screening for HIV (human immunodeficiency virus) - HIV antibody  6. Encounter for hepatitis C screening test for low risk patient - Hepatitis c antibody (reflex)  7. Screening for breast cancer - [place] DIGITAL SCREENING BILATERAL; Future  8. Medication refill - topiramate (TOPAMAX) 25 MG tablet; Take 1 tablet (25 mg total) by mouth daily.  Dispense: 30 tablet; Refill: 5   Meds ordered this encounter  Medications  . omeprazole (PRILOSEC) 40 MG capsule    Sig: Take 1 capsule (40 mg total) by mouth daily.    Dispense:  30 capsule    Refill:  3    Order Specific Question:   Supervising Provider    Answer:   Quentin AngstJEGEDE, OLUGBEMIGA E [id number]]  . polyethylene glycol powder (GLYCOLAX/MIRALAX) powder    Sig: Take 17 g by mouth daily.    Dispense:  3350 g    Refill:  1     Order Specific Question:   Supervising Provider    Answer:   Quentin AngstJEGEDE, OLUGBEMIGA E [id number]  . lisinopril (PRINIVIL,ZESTRIL) 10 MG tablet    Sig: Take 1 tablet (10 mg total) by mouth daily.    Dispense:  30 tablet    Refill:  5    Order Specific Question:   Supervising Provider    Answer:   Quentin AngstJEGEDE, OLUGBEMIGA E [id number]  . hydrochlorothiazide (HYDRODIURIL) 25 MG tablet    Sig: Take 1 tablet (25 mg total) by mouth daily.    Dispense:  30 tablet    Refill:  5    Order Specific Question:   Supervising Provider    Answer:   Quentin AngstJEGEDE, OLUGBEMIGA E [id number]  . atorvastatin (LIPITOR) 40 MG tablet    Sig: Take 1 tablet (40 mg total) by mouth daily at 6 PM.    Dispense:  30 tablet    Refill:  5    Order Specific Question:   Supervising Provider    Answer:   Jeanann LewandowskyJEGEDE, OLUGBEMIGA  E [[id number]]  . topiramate (TOPAMAX) 25 MG tablet    Sig: Take 1 tablet (25 mg total) by mouth daily.    Dispense:  30 tablet    Refill:  5    Order Specific Question:   Supervising Provider    Answer:   Quentin Angst [id number]    Follow-up: Return in about 4 weeks (around 09/14/2017) for PAP .   Loletta Specter PA

## 2017-08-17 NOTE — Patient Instructions (Signed)
Vim d? dy, Ng??i l?n (Gastritis, Adult) Vim d? dy l tnh tr?ng s?ng d? dy. C hai lo?i vim d? dy:  Vim d? dy c?p tnh. Lo?i vim ny pht tri?n ??t ng?t.  Vim d? dy m?n tnh. Lo?i vim ny ko di trong m?t th?i [employer] di. Vim d? dy x?y ra khi nim m?c d? dy tr? nn y?u v b? th??ng t?n. N?u khng ???c ?i?u tr?, vim d? dy c th? d?n ??n ch?y mu d? dy v lot. NGUYN NHN Tnh tr?ng ny c th? do:  Nhi?m trng.  U?ng qu nhi?u r??u.  M?t s? lo?i thu?c nh?t ??nh.  C qu nhi?u a xt trong d? dy.  M?t b?nh ???ng ru?t ho?c d? dy.  C?ng th?ng. TRI?U CH?NG Nh?ng tri?u ch?ng c?a tnh tr?ng ny bao g?m:  ?au ho?c nng ? b?ng trn.  Bu?n nn.  Nn.  C?m gic kh ch?u ho?c ??y b?ng sau khi ?n. Trong m?t s? tr??ng h?p, khng c tri?u ch?ng. CH?N ?ON Tnh tr?ng ny c th? ???c ch?n ?on b?ng:  M t? cc tri?u ch?ng c?a qu v?.  Khm th?c th?.  Cc ki?m tra. Cc ki?m tra ? c th? bao g?m: ? Xt nghi?m mu. ? Xt nghi?m phn. ? M?t cch ki?m tra l lu?n m?t d?ng c? m?m, m?ng c ?n v camera ? ??u xu?ng th?c qu?n v vo d? dy (n?i soi ???ng tiu ha trn). ? M?t cch ki?m tra l l?y m?t m?u m ?i xt nghi?m (sinh thi?t). ?I?U TR? Tnh tr?ng ny c th? ???c ?i?u tr? b?ng thu?c. N?u tnh tr?ng ny l do nhi?m trng gy ra, qu v? c th? ???c cho dng thu?c khng sinh. N?u tnh tr?ng ny l do c qu nhi?u a xt trong d? dy, qu v? c th? ???c dng thu?c c tn l thu?c ch?n H2, thu?c ?ng ch? b?m proton, ho?c thu?c lm gi?m a xt. ?i?u tr? c?ng c th? bao g?m vi?c d?ng s? d?ng m?t s? lo?i thu?c, ch?ng h?n nh? aspirin, ibuprofen, ho?c nh?ng lo?i thu?c ch?ng vim khng c steroid (NSAID). H??NG D?N CH?M [place] T?I NH  Ch? s? d?ng thu?c khng c?n k ??n v thu?c c?n k ??n theo ch? d?n c?a chuyn gia ch?m [place] s?c kh?e.  N?u qu v? ???c k ??n khng sinh, hy dng thu?c theo ch? d?n c?a chuyn gia ch?m [place] s?c kh?e. Khng d?ng u?ng thu?c khng sinh ngay c? khi qu  v? b?t ??u c?m th?y ?? h?n.  U?ng ?? n??c ?? gi? cho n??c ti?u trong ho?c c mu vng nh?t.  ?n cc b?a nh?, th??ng xuyn thay v cc b?a no. ?I KHM N?U:  Tri?u ch?ng c?a qu v? tr?m tr?ng h?n.  Cc tri?u ch?ng c?a qu v? ti pht sau khi ?i?u tr?Marland Kitchen. NGAY L?P T?C ?I KHM N?U:  Qu v? nn ra ma?u ho??c ch?t nn ra gi?ng nh? b c ph.  Qu v? c phn mu ?en ho?c ?? s?m.  Qu v? khng th? gi? ???c ch?t l?ng trong ng??i.  ?au b?ng tr? nn tr?m tr?ng h?n.  Qu v? b? s?t.  Qu v? khng c?m th?y ?? sau 1 tu?n. Thng tin ny khng nh?m m?c ?ch thay th? cho l?i khuyn m chuyn gia ch?m [place] s?c kh?e ni v?i qu v?. Hy b?o ??m qu v? ph?i th?o lu?n b?t k? v?n ?? g m qu v? c v?i chuyn gia ch?m [place] s?c kh?e c?a qu v?. Document  Released: 05/25/2005 Document Revised: 01/25/2013 Document Reviewed: 02/16/2015 Elsevier Interactive Patient Education  [employer]2018 Elsevier Inc.

## 2017-08-18 ENCOUNTER — Telehealth (INDEPENDENT_AMBULATORY_CARE_PROVIDER_SITE_OTHER): Payer: Self-pay

## 2017-08-18 LAB — CBC WITH DIFFERENTIAL/PLATELET
BASOS ABS: 0 10*3/uL (ref 0.0–0.2)
Basos: 0 %
EOS (ABSOLUTE): 0.4 10*3/uL (ref 0.0–0.4)
Eos: 7 %
Hematocrit: 33.9 % — ABNORMAL LOW (ref 34.0–46.6)
Hemoglobin: 10.6 g/dL — ABNORMAL LOW (ref 11.1–15.9)
IMMATURE GRANS (ABS): 0 10*3/uL (ref 0.0–0.1)
IMMATURE GRANULOCYTES: 0 %
LYMPHS: 31 %
Lymphocytes Absolute: 1.9 10*3/uL (ref 0.7–3.1)
MCH: 22.6 pg — AB (ref 26.6–33.0)
MCHC: 31.3 g/dL — ABNORMAL LOW (ref 31.5–35.7)
MCV: 72 fL — ABNORMAL LOW (ref 79–97)
MONOCYTES: 7 %
Monocytes Absolute: 0.4 10*3/uL (ref 0.1–0.9)
NEUTROS PCT: 55 %
Neutrophils Absolute: 3.3 10*3/uL (ref 1.4–7.0)
PLATELETS: 163 10*3/uL (ref 150–379)
RBC: 4.69 x10E6/uL (ref 3.77–5.28)
RDW: 16.2 % — ABNORMAL HIGH (ref 12.3–15.4)
WBC: 6 10*3/uL (ref 3.4–10.8)

## 2017-08-18 LAB — COMPREHENSIVE METABOLIC PANEL
ALT: 7 IU/L (ref 0–32)
AST: 16 IU/L (ref 0–40)
Albumin/Globulin Ratio: 1.4 (ref 1.2–2.2)
Albumin: 4.6 g/dL (ref 3.5–5.5)
Alkaline Phosphatase: 94 IU/L (ref 39–117)
BILIRUBIN TOTAL: 0.6 mg/dL (ref 0.0–1.2)
BUN/Creatinine Ratio: 39 — ABNORMAL HIGH (ref 9–23)
BUN: 65 mg/dL — AB (ref 6–24)
CALCIUM: 11.5 mg/dL — AB (ref 8.7–10.2)
CHLORIDE: 101 mmol/L (ref 96–106)
CO2: 21 mmol/L (ref 20–29)
Creatinine, Ser: 1.68 mg/dL — ABNORMAL HIGH (ref 0.57–1.00)
GFR calc non Af Amer: 33 mL/min/{1.73_m2} — ABNORMAL LOW (ref >59)
GFR, EST AFRICAN AMERICAN: 38 mL/min/{1.73_m2} — AB (ref >59)
Globulin, Total: 3.4 g/dL (ref 1.5–4.5)
Glucose: 95 mg/dL (ref 65–99)
Potassium: 3.9 mmol/L (ref 3.5–5.2)
Sodium: 143 mmol/L (ref 134–144)
TOTAL PROTEIN: 8 g/dL (ref 6.0–8.5)

## 2017-08-18 LAB — HIV ANTIBODY (ROUTINE TESTING W REFLEX): HIV Screen 4th Generation wRfx: NONREACTIVE

## 2017-08-18 LAB — HCV COMMENT:

## 2017-08-18 LAB — HEPATITIS C ANTIBODY (REFLEX)

## 2017-08-18 LAB — H. PYLORI ANTIBODY, IGG: H. pylori, IgG AbS: <0.8 Index Value (ref 0.00–0.79)

## 2017-08-18 NOTE — Telephone Encounter (Signed)
Case Manager provided with results and will try to get in contact with patients daughter to relay results. Diane Berry

## 2017-08-18 NOTE — Telephone Encounter (Signed)
-----   Message from Loletta Specteroger David Gomez, PA-C sent at 08/18/2017 10:04 AM EDT ----- Renal function impaired more than previous. Stop taking Lisinopril immediately. Drink more water and return here within one week for evaluation.

## 2017-08-26 ENCOUNTER — Other Ambulatory Visit (INDEPENDENT_AMBULATORY_CARE_PROVIDER_SITE_OTHER): Payer: Self-pay

## 2017-08-26 DIAGNOSIS — R7989 Other specified abnormal findings of blood chemistry: Secondary | ICD-10-CM

## 2017-08-27 ENCOUNTER — Telehealth (INDEPENDENT_AMBULATORY_CARE_PROVIDER_SITE_OTHER): Payer: Self-pay

## 2017-08-27 LAB — BASIC METABOLIC PANEL
BUN/Creatinine Ratio: 20 (ref 9–23)
BUN: 29 mg/dL — ABNORMAL HIGH (ref 6–24)
CO2: 22 mmol/L (ref 20–29)
Calcium: 10.1 mg/dL (ref 8.7–10.2)
Chloride: 106 mmol/L (ref 96–106)
Creatinine, Ser: 1.47 mg/dL — ABNORMAL HIGH (ref 0.57–1.00)
GFR, EST AFRICAN AMERICAN: 45 mL/min/{1.73_m2} — AB (ref >59)
GFR, EST NON AFRICAN AMERICAN: 39 mL/min/{1.73_m2} — AB (ref >59)
Glucose: 107 mg/dL — ABNORMAL HIGH (ref 65–99)
POTASSIUM: 3.6 mmol/L (ref 3.5–5.2)
SODIUM: 145 mmol/L — AB (ref 134–144)

## 2017-08-27 NOTE — Telephone Encounter (Signed)
-----   Message from Loletta Specteroger David Gomez, PA-C sent at 08/27/2017  9:29 AM EDT ----- Kidney function is better but still elevated. Will see her on f/u.

## 2017-08-27 NOTE — Telephone Encounter (Signed)
Patient case manager is aware of kidney function being better yet still elevated will see at follow up. Case manager will inform patient. Maryjean Mornempestt S Tazaria Dlugosz, CMA

## 2017-09-16 ENCOUNTER — Other Ambulatory Visit: Payer: Self-pay

## 2017-09-16 ENCOUNTER — Ambulatory Visit (INDEPENDENT_AMBULATORY_CARE_PROVIDER_SITE_OTHER): Payer: Self-pay | Admitting: Physician Assistant

## 2017-09-16 ENCOUNTER — Other Ambulatory Visit (HOSPITAL_COMMUNITY)
Admission: RE | Admit: 2017-09-16 | Discharge: 2017-09-16 | Disposition: A | Discharge status: Home / Self Care | Payer: Self-pay | Source: Ambulatory Visit | Attending: Physician Assistant | Admitting: Physician Assistant

## 2017-09-16 ENCOUNTER — Encounter (INDEPENDENT_AMBULATORY_CARE_PROVIDER_SITE_OTHER): Payer: Self-pay | Admitting: Physician Assistant

## 2017-09-16 VITALS — BP 164/93 | HR 82 | Temp 97.8°F | Ht <= 58 in | Wt 107.8 lb

## 2017-09-16 DIAGNOSIS — Z124 Encounter for screening for malignant neoplasm of cervix: Secondary | ICD-10-CM

## 2017-09-16 DIAGNOSIS — R7989 Other specified abnormal findings of blood chemistry: Secondary | ICD-10-CM

## 2017-09-16 DIAGNOSIS — R799 Abnormal finding of blood chemistry, unspecified: Secondary | ICD-10-CM

## 2017-09-16 LAB — POCT URINALYSIS DIPSTICK
BILIRUBIN UA: NEGATIVE
Glucose, UA: NEGATIVE
Ketones, UA: NEGATIVE
Leukocytes, UA: NEGATIVE
Nitrite, UA: NEGATIVE
PH UA: 7 (ref 5.0–8.0)
Protein, UA: NEGATIVE
RBC UA: NEGATIVE
Spec Grav, UA: 1.01 (ref 1.010–1.025)
UROBILINOGEN UA: 0.2 U/dL

## 2017-09-16 MED FILL — HYDROCHLOROTHIAZIDE 25 MG T: 25 | 30 days supply | Qty: 30 | Fill #1

## 2017-09-16 MED FILL — OMEPRAZOLE DR 40 MG CAPSULE: 40 | 30 days supply | Qty: 30 | Fill #1

## 2017-09-16 MED FILL — ATORVASTATIN CALCIUM 40 MG: 40 | 30 days supply | Qty: 30 | Fill #1

## 2017-09-16 MED FILL — LISINOPRIL 10 MG TABS: 10 | 30 days supply | Qty: 30 | Fill #1

## 2017-09-16 MED FILL — TOPIRAMATE 25 MG TABS: 25 | 30 days supply | Qty: 30 | Fill #1

## 2017-09-16 MED FILL — POLYETHYLENE GLYCOL 3350 PO: 30 days supply | Qty: 510 | Fill #1

## 2017-09-16 NOTE — Progress Notes (Signed)
Subjective:  Patient ID: Diane Berry, female    DOB: 07/25/1960  Age: 57 y.o. MRN: [id number]  CC: PAP  HPI Diane Berry is a 57 y.o. female with a medical history of stroke (intraventricular hemorrhage) and GERD with esophagitis presents for a pap smear collection. No genitourinary complaints. Only complains of occasional lightheadedness. No GI bleed or abdominal pain. Found to be mildly anemic last month.      Previous laboratory analysis last month revealed elevated serum creatinine at 1.68 mg/dL, BUN at 65 mg/dL, Calcium at 40.911.5 mg/dL, and Hgb 81.110.6 g/dL MCV 72 fL. She was told to stop new rx of Lisinopril in an effort to avoid AKI. Does not endorse any other symptoms or complaints.      Outpatient Medications Prior to Visit  Medication Sig Dispense Refill  . atorvastatin (LIPITOR) 40 MG tablet Take 1 tablet (40 mg total) by mouth daily at 6 PM. 30 tablet 5  . hydrochlorothiazide (HYDRODIURIL) 25 MG tablet Take 1 tablet (25 mg total) by mouth daily. 30 tablet 5  . lisinopril (PRINIVIL,ZESTRIL) 10 MG tablet Take 1 tablet (10 mg total) by mouth daily. 30 tablet 5  . omeprazole (PRILOSEC) 40 MG capsule Take 1 capsule (40 mg total) by mouth daily. 30 capsule 3  . polyethylene glycol powder (GLYCOLAX/MIRALAX) powder Take 17 g by mouth daily. 3350 g 1  . topiramate (TOPAMAX) 25 MG tablet Take 1 tablet (25 mg total) by mouth daily. 30 tablet 5   No facility-administered medications prior to visit.      ROS Review of Systems  Constitutional: Negative for chills, fever and malaise/fatigue.  Eyes: Negative for blurred vision.  Respiratory: Negative for shortness of breath.   Cardiovascular: Negative for chest pain and palpitations.  Gastrointestinal: Negative for abdominal pain and nausea.  Genitourinary: Negative for dysuria and hematuria.  Musculoskeletal: Negative for joint pain and myalgias.  Skin: Negative for rash.  Neurological: Positive for dizziness. Negative for tingling and  headaches.  Psychiatric/Behavioral: Negative for depression. The patient is not nervous/anxious.     Objective:  BP (!) 164/93 (BP Location: Right Arm, Patient Position: Sitting, Cuff Size: Normal)   Pulse 82   Temp 97.8 F (36.6 C) (Oral)   Ht 4' (1.219 m)   Wt 107 lb 12.8 oz (48.9 kg)   SpO2 100%   BMI 32.90 kg/m   BP/Weight 09/16/2017 08/17/2017 07/28/2017  Systolic BP 164 100 146  Diastolic BP 93 69 74  Wt. (Lbs) 107.8 106.6 -  BMI 32.9 32.53 -      Physical Exam  Constitutional: She is oriented to person, place, and time.  thin, NAD, polite  HENT:  Head: Normocephalic and atraumatic.  Eyes: No scleral icterus.  Neck: Normal range of motion. Neck supple. No thyromegaly present.  Cardiovascular: Normal rate, regular rhythm and normal heart sounds.  Pulmonary/Chest: Effort normal and breath sounds normal.  Abdominal: Soft. Bowel sounds are normal. There is no tenderness.  Genitourinary:  Genitourinary Comments: Vagina atrophic, cervix unremarkable and nontender, left and right adnexa without tenderness or mass, uterus without mass or tenderness.  Musculoskeletal: She exhibits no edema.  Neurological: She is alert and oriented to person, place, and time.  Skin: Skin is warm and dry. No rash noted. No erythema. No pallor.  Psychiatric: She has a normal mood and affect. Her behavior is normal. Thought content normal.  Vitals reviewed.    Assessment & Plan:   1. Screening for cervical cancer - Cytology -  PAP([place])  2. Elevated serum creatinine - Sedimentation Rate - C-reactive protein - ANA w/Reflex - Urinalysis Dipstick - Thyroid Panel With TSH - Lipid panel  3. Hypercalcemia - Parathyroid hormone, intact (no Ca) - Calcium, ionized  4. Elevated BUN - Thyroid Panel With TSH - Lipid panel - DDX: volume depletion, nephrosis, obstruction, GI bleed, NSAIDs   Follow-up: 4 weeks for f/u elevated serum creatinine  Loletta Specter PA

## 2017-09-16 NOTE — Patient Instructions (Signed)
Serum Creatinine Test Why am I having this test? A creatinine test is performed to measure the amount of creatinine in your blood (serum). Creatinine is a waste product of normal muscle activity (contraction). The kidneys filter creatinine from your blood and remove it from your body through urination. Blood creatinine levels stay consistent in people whose muscle mass stays consistent. This level can be increased in people who perform resistance exercise to increase muscle mass. Because creatinine is removed from your body by the kidneys, this test is often used as a way to measure kidney function. Your health care provider may recommend this test if he or she suspects that you have a condition that is negatively affecting your kidney function. This test may also be done as a part of routine blood work used to assess your overall health. What kind of sample is taken? A blood sample is required for this test. It is usually collected by inserting a needle into a vein or by sticking a finger with a small needle. For children, the blood sample is usually collected by sticking the child's heel with a small needle. How do I prepare for this test? There is no preparation or fasting required for this test. What are the reference ranges? Reference ranges are considered healthy ranges established after testing a large group of healthy people. Reference ranges may vary among different people, labs, and hospitals. It is your responsibility to obtain your test results. Ask the lab or department performing the test when and how you will get your results.  Children or adolescents: ? Less than 2 years old: 0.1-0.4 mg/dL. ? 2-5 years old: 0.2-0.5 mg/dL. ? 6-9 years old: 0.3-0.6 mg/dL. ? 10-17 years old: 0.4-1 mg/dL.  Adult female: ? 18-40 years old: 0.5-1 mg/dL. ? 41-60 years old: 0.5-1.1 mg/dL. ? 61 years and older: 0.5-1.2 mg/dL.  Adult female: ? 18-40 years old: 0.6-1.2 mg/dL. ? 41-60 years old: 0.6-1.3  mg/dL. ? 61 years and older: 0.7-1.3 mg/dL.  The reference range may be higher in people who perform resistance exercise to increase muscle mass. What do the results mean? Abnormally high levels of serum creatinine can be caused by many health conditions. These may include:  Kidney disease.  Urinary tract obstruction.  Lower-than-normal blood flow to the kidneys.  Rhabdomyolysis. This occurs when muscle damage causes the release of molecules into the bloodstream, which results in kidney damage.  Acromegaly. This is a condition that causes enlarged bones.  Gigantism.  Abnormally low levels of serum creatinine can also be caused by many health conditions. These may include:  Conditions that cause you to be inactive throughout much of the day.  Decreased muscle mass.  Talk with your health care provider to discuss your results, treatment options, and if necessary, the need for more tests. Talk with your health care provider if you have any questions about your results. Talk with your health care provider to discuss your results, treatment options, and if necessary, the need for more tests. Talk with your health care provider if you have any questions about your results. This information is not intended to replace advice given to you by your health care provider. Make sure you discuss any questions you have with your health care provider. Document Released: 06/17/2004 Document Revised: 01/18/2016 Document Reviewed: 10/19/2013 Elsevier Interactive Patient Education  2018 Elsevier Inc.  

## 2017-09-19 LAB — PARATHYROID HORMONE, INTACT (NO CA)

## 2017-09-19 LAB — C-REACTIVE PROTEIN

## 2017-09-19 LAB — LIPID PANEL
CHOL/HDL RATIO: 2.5 ratio (ref 0.0–4.4)
Cholesterol, Total: 126 mg/dL (ref 100–199)
HDL: 51 mg/dL (ref >39)
LDL Calculated: 38 mg/dL (ref 0–99)
Triglycerides: 184 mg/dL — ABNORMAL HIGH (ref 0–149)
VLDL CHOLESTEROL CAL: 37 mg/dL (ref 5–40)

## 2017-09-19 LAB — THYROID PANEL WITH TSH
Free Thyroxine Index: 2.1 (ref 1.2–4.9)
T3 Uptake Ratio: 30 % (ref 24–39)
T4, Total: 6.9 ug/dL (ref 4.5–12.0)
TSH: 0.656 u[IU]/mL (ref 0.450–4.500)

## 2017-09-19 LAB — SEDIMENTATION RATE: SED RATE: 49 mm/h — AB (ref 0–40)

## 2017-09-19 LAB — ANA W/REFLEX: Anti Nuclear Antibody(ANA): NEGATIVE

## 2017-09-19 LAB — CALCIUM, IONIZED: Calcium, Ion: 5.2 mg/dL (ref 4.5–5.6)

## 2017-09-20 LAB — CYTOLOGY - PAP
BACTERIAL VAGINITIS: NEGATIVE
Candida vaginitis: NEGATIVE
Chlamydia: NEGATIVE
DIAGNOSIS: NEGATIVE
NEISSERIA GONORRHEA: NEGATIVE
Trichomonas: NEGATIVE

## 2017-09-28 ENCOUNTER — Telehealth (INDEPENDENT_AMBULATORY_CARE_PROVIDER_SITE_OTHER): Payer: Self-pay

## 2017-09-28 NOTE — Telephone Encounter (Signed)
Normal pap, thyroid, and calcium, no autoimmune disease elevated trigs take OTc fish oil pill and reduce sweets all these results provided to the case manager/interpreter she will call patient and inform. Maryjean Mornempestt S Kemara Quigley, CMA

## 2017-09-28 NOTE — Telephone Encounter (Signed)
-----   Message from Loletta Specteroger David Gomez, PA-C sent at 09/27/2017  8:47 AM EDT ----- PAP normal. Thyroid normal. No autoimmune disease. Very mild inflammation noted. Normal calcium. Triglycerides mildly elevated, please take OTC fish oil pills and reduce sweets in the diet.

## 2017-10-11 ENCOUNTER — Ambulatory Visit (INDEPENDENT_AMBULATORY_CARE_PROVIDER_SITE_OTHER): Payer: Self-pay | Admitting: Physician Assistant

## 2017-10-12 ENCOUNTER — Ambulatory Visit (INDEPENDENT_AMBULATORY_CARE_PROVIDER_SITE_OTHER): Payer: Self-pay | Admitting: Physician Assistant

## 2017-10-12 ENCOUNTER — Ambulatory Visit: Payer: Self-pay | Admitting: Neurology

## 2017-10-12 ENCOUNTER — Encounter: Payer: Self-pay | Admitting: Neurology

## 2017-10-12 ENCOUNTER — Encounter (INDEPENDENT_AMBULATORY_CARE_PROVIDER_SITE_OTHER): Payer: Self-pay | Admitting: Physician Assistant

## 2017-10-12 ENCOUNTER — Telehealth: Payer: Self-pay | Admitting: Neurology

## 2017-10-12 VITALS — BP 149/89 | HR 87 | Temp 97.8°F | Resp 18 | Ht 60.63 in | Wt 102.4 lb

## 2017-10-12 VITALS — BP 145/87 | HR 77 | Ht <= 58 in | Wt 103.0 lb

## 2017-10-12 DIAGNOSIS — Z760 Encounter for issue of repeat prescription: Secondary | ICD-10-CM

## 2017-10-12 DIAGNOSIS — I10 Essential (primary) hypertension: Secondary | ICD-10-CM

## 2017-10-12 DIAGNOSIS — M25512 Pain in left shoulder: Secondary | ICD-10-CM

## 2017-10-12 DIAGNOSIS — I618 Other nontraumatic intracerebral hemorrhage: Secondary | ICD-10-CM

## 2017-10-12 DIAGNOSIS — R7989 Other specified abnormal findings of blood chemistry: Secondary | ICD-10-CM

## 2017-10-12 MED ORDER — ACETAMINOPHEN 500 MG PO TABS: 500.0000 mg | ORAL_TABLET | Freq: Three times a day (TID) | ORAL | 0 refills | Status: DC | PRN

## 2017-10-12 MED ORDER — AMLODIPINE BESYLATE 2.5 MG PO TABS: 2.5000 mg | ORAL_TABLET | Freq: Every day | ORAL | 5 refills | Status: DC

## 2017-10-12 MED ORDER — TOPIRAMATE 25 MG PO TABS: 25.0000 mg | ORAL_TABLET | Freq: Two times a day (BID) | ORAL | 5 refills | Status: DC

## 2017-10-12 MED FILL — AMLODIPINE 2.5 MG TABLET: 2.5 | 30 days supply | Qty: 30 | Fill #0

## 2017-10-12 MED FILL — ATORVASTATIN CALCIUM 40 MG: 40 | 30 days supply | Qty: 30 | Fill #2

## 2017-10-12 MED FILL — TOPIRAMATE 25 MG TABS: 25 | 30 days supply | Qty: 60 | Fill #0

## 2017-10-12 MED FILL — TOPIRAMATE 25 MG TABS: 25 | 30 days supply | Qty: 30 | Fill #2

## 2017-10-12 MED FILL — POLYETHYLENE GLYCOL 3350 PO: 30 days supply | Qty: 510 | Fill #2

## 2017-10-12 MED FILL — OMEPRAZOLE DR 40 MG CAPSULE: 40 | 30 days supply | Qty: 30 | Fill #2

## 2017-10-12 MED FILL — HYDROCHLOROTHIAZIDE 25 MG T: 25 | 30 days supply | Qty: 30 | Fill #2

## 2017-10-12 NOTE — Patient Instructions (Signed)
I had a long discussion with the patient and her daughter using an interpreter who was present throughout this visit. I explained that the etiology of her intraventricular hemorrhage was still indeterminate. Since she is still having persistent temporal headaches I would recommend repeating MRI scan of the brain with and without contrast. Check ESR, CBC and basic metabolic panel labs. Increase Topamax dose to 25 mg twice daily. Maintain strict control of hypertension with blood pressure goal below 130/90. I encouraged patient to be active and exercise regularly. She will return for follow-up in 3 months with my nurse practitioner Janett Billow or call earlier if necessary

## 2017-10-12 NOTE — Telephone Encounter (Signed)
medicaid order sent to GI. They obtain the auth and will reach out to the pt to schedule.  °

## 2017-10-12 NOTE — Patient Instructions (Signed)
DASH Eating Plan DASH stands for "Dietary Approaches to Stop Hypertension." The DASH eating plan is a healthy eating plan that has been shown to reduce high blood pressure (hypertension). It may also reduce your risk for type 2 diabetes, heart disease, and stroke. The DASH eating plan may also help with weight loss. What are tips for following this plan? General guidelines  Avoid eating more than 2,300 mg (milligrams) of salt (sodium) a day. If you have hypertension, you may need to reduce your sodium intake to 1,500 mg a day.  Limit alcohol intake to no more than 1 drink a day for nonpregnant women and 2 drinks a day for men. One drink equals 12 oz of beer, 5 oz of wine, or 1 oz of hard liquor.  Work with your health care provider to maintain a healthy body weight or to lose weight. Ask what an ideal weight is for you.  Get at least 30 minutes of exercise that causes your heart to beat faster (aerobic exercise) most days of the week. Activities may include walking, swimming, or biking.  Work with your health care provider or diet and nutrition specialist (dietitian) to adjust your eating plan to your individual calorie needs. Reading food labels  Check food labels for the amount of sodium per serving. Choose foods with less than 5 percent of the Daily Value of sodium. Generally, foods with less than 300 mg of sodium per serving fit into this eating plan.  To find whole grains, look for the word "whole" as the first word in the ingredient list. Shopping  Buy products labeled as "low-sodium" or "no salt added."  Buy fresh foods. Avoid canned foods and premade or frozen meals. Cooking  Avoid adding salt when cooking. Use salt-free seasonings or herbs instead of table salt or sea salt. Check with your health care provider or pharmacist before using salt substitutes.  Do not fry foods. Cook foods using healthy methods such as baking, boiling, grilling, and broiling instead.  Cook with  heart-healthy oils, such as olive, canola, soybean, or sunflower oil. Meal planning   Eat a balanced diet that includes: ? 5 or more servings of fruits and vegetables each day. At each meal, try to fill half of your plate with fruits and vegetables. ? Up to 6-8 servings of whole grains each day. ? Less than 6 oz of lean meat, poultry, or fish each day. A 3-oz serving of meat is about the same size as a deck of cards. One egg equals 1 oz. ? 2 servings of low-fat dairy each day. ? A serving of nuts, seeds, or beans 5 times each week. ? Heart-healthy fats. Healthy fats called Omega-3 fatty acids are found in foods such as flaxseeds and coldwater fish, like sardines, salmon, and mackerel.  Limit how much you eat of the following: ? Canned or prepackaged foods. ? Food that is high in trans fat, such as fried foods. ? Food that is high in saturated fat, such as fatty meat. ? Sweets, desserts, sugary drinks, and other foods with added sugar. ? Full-fat dairy products.  Do not salt foods before eating.  Try to eat at least 2 vegetarian meals each week.  Eat more home-cooked food and less restaurant, buffet, and fast food.  When eating at a restaurant, ask that your food be prepared with less salt or no salt, if possible. What foods are recommended? The items listed may not be a complete list. Talk with your dietitian about what   dietary choices are best for you. Grains Whole-grain or whole-wheat bread. Whole-grain or whole-wheat pasta. Brown rice. Oatmeal. Quinoa. Bulgur. Whole-grain and low-sodium cereals. Pita bread. Low-fat, low-sodium crackers. Whole-wheat flour tortillas. Vegetables Fresh or frozen vegetables (raw, steamed, roasted, or grilled). Low-sodium or reduced-sodium tomato and vegetable juice. Low-sodium or reduced-sodium tomato sauce and tomato paste. Low-sodium or reduced-sodium canned vegetables. Fruits All fresh, dried, or frozen fruit. Canned fruit in natural juice (without  added sugar). Meat and other protein foods Skinless chicken or turkey. Ground chicken or turkey. Pork with fat trimmed off. Fish and seafood. Egg whites. Dried beans, peas, or lentils. Unsalted nuts, nut butters, and seeds. Unsalted canned beans. Lean cuts of beef with fat trimmed off. Low-sodium, lean deli meat. Dairy Low-fat (1%) or fat-free (skim) milk. Fat-free, low-fat, or reduced-fat cheeses. Nonfat, low-sodium ricotta or cottage cheese. Low-fat or nonfat yogurt. Low-fat, low-sodium cheese. Fats and oils Soft margarine without trans fats. Vegetable oil. Low-fat, reduced-fat, or light mayonnaise and salad dressings (reduced-sodium). Canola, safflower, olive, soybean, and sunflower oils. Avocado. Seasoning and other foods Herbs. Spices. Seasoning mixes without salt. Unsalted popcorn and pretzels. Fat-free sweets. What foods are not recommended? The items listed may not be a complete list. Talk with your dietitian about what dietary choices are best for you. Grains Baked goods made with fat, such as croissants, muffins, or some breads. Dry pasta or rice meal packs. Vegetables Creamed or fried vegetables. Vegetables in a cheese sauce. Regular canned vegetables (not low-sodium or reduced-sodium). Regular canned tomato sauce and paste (not low-sodium or reduced-sodium). Regular tomato and vegetable juice (not low-sodium or reduced-sodium). Pickles. Olives. Fruits Canned fruit in a light or heavy syrup. Fried fruit. Fruit in cream or butter sauce. Meat and other protein foods Fatty cuts of meat. Ribs. Fried meat. Bacon. Sausage. Bologna and other processed lunch meats. Salami. Fatback. Hotdogs. Bratwurst. Salted nuts and seeds. Canned beans with added salt. Canned or smoked fish. Whole eggs or egg yolks. Chicken or turkey with skin. Dairy Whole or 2% milk, cream, and half-and-half. Whole or full-fat cream cheese. Whole-fat or sweetened yogurt. Full-fat cheese. Nondairy creamers. Whipped toppings.  Processed cheese and cheese spreads. Fats and oils Butter. Stick margarine. Lard. Shortening. Ghee. Bacon fat. Tropical oils, such as coconut, palm kernel, or palm oil. Seasoning and other foods Salted popcorn and pretzels. Onion salt, garlic salt, seasoned salt, table salt, and sea salt. Worcestershire sauce. Tartar sauce. Barbecue sauce. Teriyaki sauce. Soy sauce, including reduced-sodium. Steak sauce. Canned and packaged gravies. Fish sauce. Oyster sauce. Cocktail sauce. Horseradish that you find on the shelf. Ketchup. Mustard. Meat flavorings and tenderizers. Bouillon cubes. Hot sauce and Tabasco sauce. Premade or packaged marinades. Premade or packaged taco seasonings. Relishes. Regular salad dressings. Where to find more information:  National Heart, Lung, and Blood Institute: www.nhlbi.nih.gov  American Heart Association: www.heart.org Summary  The DASH eating plan is a healthy eating plan that has been shown to reduce high blood pressure (hypertension). It may also reduce your risk for type 2 diabetes, heart disease, and stroke.  With the DASH eating plan, you should limit salt (sodium) intake to 2,300 mg a day. If you have hypertension, you may need to reduce your sodium intake to 1,500 mg a day.  When on the DASH eating plan, aim to eat more fresh fruits and vegetables, whole grains, lean proteins, low-fat dairy, and heart-healthy fats.  Work with your health care provider or diet and nutrition specialist (dietitian) to adjust your eating plan to your individual   calorie needs. This information is not intended to replace advice given to you by your health care provider. Make sure you discuss any questions you have with your health care provider. Document Released: 05/14/2011 Document Revised: 05/18/2016 Document Reviewed: 05/18/2016 Elsevier Interactive Patient Education  2018 [employer].    Ch??ng trnh ?n u?ng DASH DASH Eating Plan DASH l vi?t t?t c?a "Dietary Approaches to  Stop Hypertension", ngh?a l Ph??ng php ti?p c?n ch? ?? ?n u?ng ?? ng?n ch?n t?ng huy?t p. Ch??ng trnh ?n u?ng DASH l ch??ng trnh ?n u?ng lnh m?nh ? ???c ch?ng minh c tc d?ng lm gi?m huy?t p cao (t?ng huy?t p). N c?ng c th? lm gi?m nguy c? b? ti?u d??ng tup 2, b?nh tim v ??t qu?. Ch??ng trnh ?n u?ng DASH c?ng c th? gip gi?m cn. C nh?ng l?i khuyn no ?? lm theo ch??ng trnh ny? H??ng d?n chung  Trnh ?n trn 2.300 mg (miligam) mu?i (natri) m?i ngy. N?u b? t?ng huy?t p, qu v? c th? c?n gi?m l??ng dng natri xu?ng ??n m?c 1.500 mg m?i ngy.  Gi?i h?n l??ng r??u qu v? u?ng khng qu 1 ly m?i ngy v?i ph? n? khng mang thai v 2 ly m?i ngy v?i nam gi?i. M?t ly t??ng ???ng v?i 12 ao-x? bia, 5 ao-x? r??u vang, ho?c 1 ao-x? r??u m?nh.  H?p tc v?i chuyn gia ch?m [place] s?c kh?e c?a qu v? ?? duy tr tr?ng l??ng c? th? c l?i cho s?c kh?e ho?c ?? gi?m cn. Hy h?i xem tr?ng l??ng no l l t??ng cho qu v?.  Dnh t nh?t 30 pht t?p th? d?c c th? khi?n tim qu v? ??p nhanh h?n (t?p th? d?c nh?p ?i?u) h?u h?t cc ngy trong tu?n. Cc ho?t ??ng c th? bao g?m ?i b?, b?i, ho?c ??p xe.  H?p tc v?i chuyn gia ch?m [place] s?c kh?e ho?c chuyn gia ch? ?? ?n v dinh d??ng (bc s? chuyn khoa dinh d??ng) c?a qu v? ?? ?i?u ch?nh ch??ng trnh ?n u?ng ph h?p v?i nhu c?u calo c?a c nhn qu v?. ??c nhn th?c ph?m  Ki?m tra nhn th?c ph?m ?? xem hm l??ng natri cho m?i kh?u ph?n. Ch?n nh?ng th?c ph?m c d??i 5 ph?n tr?m L??ng natri hng ngy. Ni chung, nh?ng th?c ph?m c d??i 300 mg natri cho m?i kh?u ph?n ph h?p v?i ch??ng trnh ?n u?ng ny.  ?? tm cc lo?i ng? c?c nguyn h?t, hy ki?m t? "nguyn h?t", l t? ??u tin trong danh sch thnh ph?n. Mua s?m  Mua cc s?n ph?m dn nhn "natri th?p" ho?c "khng thm mu?i."  Mua th?c ph?m t??i. Trnh nh?ng th?c ph?m ?ng h?p v cc mn ?n ch? bi?n s?n ho?c ?ng l?nh. N?u n??ng  Trnh thm mu?i khi n?u ?n. S? d?ng gia v? khng co? mu?i  ho?c th?o d??c thay v mu?i ?n ho?c mu?i bi?n. Ki?m tra v?i chuyn gia ch?m [place] s?c kh?e ho??c d???c sy? c?a quy? vi? tr??c khi s? d?ng cc s?n ph?m thay th? mu?i.  Khng chin/rn ?? ?n. N?u ?? ?n b?ng nh?ng ph??ng php c l?i cho s?c kh?e nh? b? l, lu?c, n??ng v hun nng.  N?u ?n b?ng d?u t?t cho tim, ch?ng h?n nh? d?u  liu, c?i d?u, ??u nnh, ho?c h??ng d??ng. Ln k? ho?ch cho b?a ?n   ?n ch? ?? ?n lnh m?nh bao g?m: ? T? 5 kh?u ph?n tri cy v rau tr? ln  m?i ngy. Vo m?i b?a ?n, c? g?ng dnh m?t n?a ??a cho tri cy v rau. ? T?i ?a 6-8 kh?u ph?n ng? c?c nguyn h?t m?i ngy. ? D??i 6 ao-x? th?t n?c, th?t gia c?m, ho?c c m?i ngy. M?t kh?u ph?n 3 ao-x? th?t t??ng ???ng kch th??c c?a m?t b? bi. M?t qu? tr?ng t??ng ???ng 1 ao-x?. ? 2 kh?u ph?n s?a t bo m?i ngy. ? M?t kh?u ph?n qu? h?ch, cc lo?i h?t, ho?c ??u 5 l?n m?i tu?n. ? Ch?t bo t?t cho tim. Nh?ng ch?t bo lnh m?nh c tn l axit bo Omega-3, ???c tm th?y trong cc th?c ph?m nh? h?t lanh v c n??c l?nh, nh? c mi, c h?i v c thu.  H?n ch? l??ng ?n nh?ng th?c ph?m sau ?y: ? Th?c ph?m ?ng h?p ho?c ?ng gi s?n. ? Th?c ph?m giu ch?t bo chuy?n ha , ch?ng h?n nh? th?c ph?m chin/rn. ? Th?c ph?m giu ch?t bo bo ha, ch?ng h?n nh? th?t m?. ? ?? ng?t, cc mn trng mi?ng, ?? u?ng c ???ng v nh?ng th?c ph?m khc c b? sung ???ng. ? S?n ph?m t? s?a nguyn ch?t bo.  Khng thm mu?i vo th?c ph?m tr??c khi ?n.  C? g?ng ?n t?i thi?u 2 b?a chay m?i tu?n.  ?n nhi?u th?c ?n n?u t?i nh h?n v i?t th??c ?n ?? nh hng, th??c ?n t? ch?n v th?c ?n nhanh h?n.  Khi ?n ? nh hng, hy yu c?u th??c ?n c?a qu v? ???c n?u nha?t ho?c khng c mu?i, n?u c th?. Nh?ng lo?i th?c ?n no ???c khuy?n ngh?? Nh?ng m?c ???c li?t k c th? khng ph?i danh sch ??y ??. Hy trao ??i v?i bc s? chuyn khoa dinh d??ng v? cc l?a ch?n ch? ?? dinh d??ng no ph h?p nh?t v?i qu v?. Ng? c?c Bnh m nguyn h?t ho?c nguyn cm. M ?ng  nguyn h?t ho?c nguyn cm. Ga?o l??t. B?t y?n m?ch. H?t dim m?ch (quinoa). T?m la m (bulgur). Ng? c?c nguyn h?t v ng? c?c t natri. Bnh m pita. Bnh quy gin t bo, t natri. Bnh ng nguyn cm. Derrek Gu c? Derrek Gu c? t??i ho?c ?ng l?nh (s?ng, h?p, bo? lo? ho?c n??ng). N???c p c chua v n??c rau p i?t natri ho??c gi?m natri. N??c x?t c chua va? h?n h?p c chua nho i?t natri ho??c gi?m natri. Rau ?o?ng h?p i?t natri ho??c gia?m natri. Tri cy T?t c? tri cy t??i, kh, ho?c ?ng l?nh. Tri cy ?ng h?p d??i d?ng n??c p t? nhin (khng c b? sung ???ng). Th?t v cc th?c ph?m c protein khc Ga? ho??c ga? ty bo? da. G ho?c g ty xay. Th?t heo l?c m?. C v h?i s?n. Lng tr?ng tr?ng. Cc lo?i ??u, ??u H Lan ho??c ??u l?ng kh. Qu? h?ch, b? h?t v cc lo?i h?t khng mu?i. ??u ?ng h?p khng ??p mu?i. Mi?ng th?t b n?c ? l?c m?. Th?t deli n?c, t natri. S?a S?a t bo (1%) ho?c khng bo (tch bo). Ph mt khng bo, t bo, ho?c gi?m bo. Ph mt s?a g?n kem ho?c ph mt ricotta t natri, khng bo. S?a chua t bo ho?c khng bo. Ph mt t bo, t natri. M? v d?u B? th?c v?t m?m khng c ch?t bo chuy?n ha. D?u th?c v?t. N??c x?t mayonnaise v n??c tr?n sa lt t bo, gi?m bo, ho?c lo?i nh? (gi?m natri). D?u h?t c?i d?u, d?u rum, d?u  liu, d?u ??u nnh v d?u h??ng d??ng. Qu? b?. Gia v? v cc th?c ph?m khc. Th?o d??c. Gia v?. H?n h?p gia v? khng c mu?i. B?ng ng va? ba?nh quy khng mu?i. ?? ng?t khng bo. Nh?ng lo?i th?c ?n no khng ???c khuy?n ngh?? Nh?ng m?c ???c li?t k c th? khng ph?i danh sch ??y ??. Hy trao ??i v?i bc s? chuyn khoa dinh d??ng v? cc l?a ch?n ch? ?? dinh d??ng no ph h?p nh?t v?i qu v?. Ng? c?c Cc lo?i bnh n??ng c ch?t bo, ch?ng h?n bnh s?ng b, bnh n??ng x?p, ho?c m?t s? lo?i bnh m. M ?ng kh ho?c cc ti b?t g?o. Derrek Gu c? Derrek Gu c? tr?n kem ho??c xa?o. Cc lo?i rau tr?n n??c x?t pho mt. Rau ?o?ng h?p thng th??ng (khng ph?i lo?i t  natri ho??c gia?m natri). N??c x?t c chua ?ng h?p va? h?n h?p c chua nho thng th??ng (khng ph?i lo?i i?t natri ho??c gi?m natri). N???c p rau v c chua thng th??ng (khng ph?i lo?i i?t natri ho??c gi?m natri). D?a chua.  liu. Tri cy Tri cy ?ng h?p ngm xi-r loa?ng ho??c ???c. Tri cy kh. Tri cy ngm trong kem ho?c n??c x?t b?. Th?t v cc th?c ph?m c protein khc Cc la?t thi?t m??. X??ng s??n. Th?t kh. Th?t l?n mu?i xng khi. Xc xch. Cc lo?i th?t hun khi v th?t h?p ch? bi?n s?n khc. Xalami. M? l?ng. Xc xch nng. Mn xc xch l?n ?? rn. Qu? h?ch v cc lo?i h?t ??p mu?i. ??u ?ng h?p c b? sung mu?i. C ?ng h?p ho?c hun khi. Nguyn qu? tr?ng ho?c lng ?? tr?ng. G ho?c g ty cn da. S?a S??a nguyn kem ho??c 2%, kem v n?a n? n?a kia. Ph mt nguyn kem ho?c ph mt kem nguyn ch?t bo. S??a chua nguyn ch?t bo ho?c s?a chua co? ????ng. Pho mt nguyn ch?t bo. B?t kem khng s?a. L??p phu? kem ?a? ?a?nh bng. Pho mt ch? bi?n s?n v pho mt ph?t. M? v d?u B?. B? th?c v?t d?ng th?i. M? l?n. M? tr?u (shortening). B? s??a tru. M?? thi?t xng kho?i. D?u nhi?t ??i, ch?ng h?n nh? d?u d?a, d?u h?t c?, ho?c d?u c?. Gia v? v cc th?c ph?m khc. B?ng ng v bnh quy m??n. Mu?i hnh, mu?i t?i, mu?i nm, mu?i ?n v mu?i bi?n. N???c x?t Worcestershire. N???c x?t tartar. N??c x?t th?t quay. N???c x?t Teriyaki. N??c t??ng, bao g?m loa?i gi?m natri. N??c x?t th?t n??ng. N???c thi?t ?ng h?p v ?ng gi. N??c m?m. D?u ho. N??c x?t cocktail. Cy c?i ng?a quy? vi? th?y trn k? ha?ng. N??c x?t c chua. M t?t. H??ng li?u th?t v ch?t la?m m?m thi?t. Tho?i b?t canh. N??t x?t nng v n??c x?t [place]. N??c x?t marinat ch? bi?n s?n ho?c ?ng gi. Gia v? taco ch? bi?n s?n ho?c ?ng gi. ?? gia v?. N???c tr?n sa la?t thng th???ng. N?i ?? tm thm thng tin:  Vi?n Tim, Ph?i v Mu Qu?c gia: [email or web address]  Hi?p H?i [employer] K?: www.heart.org Tm t?t  Ch??ng trnh ?n u?ng  DASH l ch??ng trnh ?n u?ng lnh m?nh ? ???c ch?ng minh c tc d?ng lm gi?m huy?t p cao (t?ng huy?t p). N c?ng c th? lm gi?m nguy c? b? ti?u d??ng tup 2, b?nh tim v ??t qu?Marland Kitchen  V?i ch??ng trnh ?n u?ng DASH, qu v? c?n ph?i gi?i h?n l??ng mu?i (natri) ? m?c 2.300 mg m?t ngy.  N?u b? t?ng huy?t p, qu v? c th? c?n gi?m l??ng dng natri xu?ng ??n m?c 1.500 mg m?i ngy.  Khi ?ang th?c hi?n theo ch??ng trnh ?n u?ng DASH, m?c ?ch l ?n nhi?u tri cy v rau c? t??i, ng? c?c nguyn h?t, protein th?t n?c, s?a t bo v ch?t bo t?t cho tim h?n.  H?p tc v?i chuyn gia ch?m [place] s?c kh?e ho?c chuyn gia ch? ?? ?n v dinh d??ng (bc s? chuyn khoa dinh d??ng) c?a qu v? ?? ?i?u ch?nh ch??ng trnh ?n u?ng ph h?p v?i nhu c?u calo c?a c nhn qu v?. Thng tin ny khng nh?m m?c ?ch thay th? cho l?i khuyn m chuyn gia ch?m [place] s?c kh?e ni v?i qu v?. Hy b?o ??m qu v? ph?i th?o lu?n b?t k? v?n ?? g m qu v? c v?i chuyn gia ch?m [place] s?c kh?e c?a qu v?. Document Released: 09/24/2016 Document Revised: 09/24/2016 Document Reviewed: 09/24/2016 Elsevier Interactive Patient Education  2018 [employer].

## 2017-10-12 NOTE — Progress Notes (Signed)
Subjective:  Patient ID: Diane Berry, female    DOB: Jun 30, 1960  Age: 57 y.o. MRN: [id number]  CC: f/u HTN  HPI Diane Berry a 57 y.o.femalewith a medical history of stroke (intraventricular hemorrhage) and GERD with esophagitis presents on f/u of HTN. Had stopped lisinopril due to elevated serum creatinine at 1.68 mg/dL. BP 149/89 mmHg today. Denies CP, palpitations, SOB, HA, tingling, numbness, weakness, paralysis, f/c/n/v, presyncope, syncope, rash, swelling, or GI/GU sxs.     Also describes left shoulder pain x3 weeks and "cold" sensation of the left side of head. Uses a winter cap to resolve the cold sensation of the head. Any movement of the left shoulder causes pain. Pain not present when arm/shoulder is inactive. No other associated symptoms. PHQ9 1 and GAD7 0 today.    Outpatient Medications Prior to Visit  Medication Sig Dispense Refill  . atorvastatin (LIPITOR) 40 MG tablet Take 1 tablet (40 mg total) by mouth daily at 6 PM. 30 tablet 5  . hydrochlorothiazide (HYDRODIURIL) 25 MG tablet Take 1 tablet (25 mg total) by mouth daily. 30 tablet 5  . omeprazole (PRILOSEC) 40 MG capsule Take 1 capsule (40 mg total) by mouth daily. 30 capsule 3  . polyethylene glycol powder (GLYCOLAX/MIRALAX) powder Take 17 g by mouth daily. 3350 g 1  . topiramate (TOPAMAX) 25 MG tablet Take 1 tablet (25 mg total) by mouth 2 (two) times daily. 30 tablet 5  . lisinopril (PRINIVIL,ZESTRIL) 10 MG tablet Take 1 tablet (10 mg total) by mouth daily. (Patient not taking: Reported on 10/12/2017) 30 tablet 5   No facility-administered medications prior to visit.      ROS Review of Systems  Constitutional: Negative for chills, fever and malaise/fatigue.  Eyes: Negative for blurred vision.  Respiratory: Negative for shortness of breath.   Cardiovascular: Negative for chest pain and palpitations.  Gastrointestinal: Negative for abdominal pain and nausea.  Genitourinary: Negative for dysuria and hematuria.   Musculoskeletal: Positive for joint pain. Negative for myalgias.  Skin: Negative for rash.  Neurological: Negative for tingling and headaches.  Psychiatric/Behavioral: Negative for depression. The patient is not nervous/anxious.     Objective:  BP (!) 149/89 (BP Location: Right Arm, Patient Position: Sitting, Cuff Size: Normal)   Pulse 87   Temp 97.8 F (36.6 C) (Oral)   Resp 18   Ht 5' 0.63" (1.54 m)   Wt 102 lb 6.4 oz (46.4 kg)   SpO2 98%   BMI 19.59 kg/m   BP/Weight 10/12/2017 10/12/2017 09/16/2017  Systolic BP 149 145 164  Diastolic BP 89 87 93  Wt. (Lbs) 102.4 103 107.8  BMI 19.59 31.43 32.9      Physical Exam  Constitutional: She is oriented to person, place, and time.  Elderly, frail, NAD, polite  HENT:  Head: Normocephalic and atraumatic.  Eyes: No scleral icterus.  Neck: Normal range of motion. Neck supple. No thyromegaly present.  Cardiovascular: Normal rate, regular rhythm and normal heart sounds.  Pulmonary/Chest: Effort normal and breath sounds normal.  Musculoskeletal: She exhibits no edema.  Left shoulder with full aROM. All provacative testing reportedly causes pain.  Neurological: She is alert and oriented to person, place, and time.  Skin: Skin is warm and dry. No rash noted. No erythema. No pallor.  Psychiatric: She has a normal mood and affect. Her behavior is normal. Thought content normal.  Vitals reviewed.   * pt does not follow simple directions for provocative testing. Can not answer questions to adequately  assess which motions are causing pain even with the use of two interpreters. Pt does not grimace or show signs of pain during provocative testing.      Assessment & Plan:    1. Hypertension, unspecified type - Begin amLODipine (NORVASC) 2.5 MG tablet; Take 1 tablet (2.5 mg total) by mouth daily.  Dispense: 30 tablet; Refill: 5 - Continue HCTZ 25 mg qam  2. Acute pain of left shoulder - DG Shoulder Left; Future - Begin acetaminophen  (TYLENOL) 500 MG tablet; Take 1 tablet (500 mg total) by mouth every 8 (eight) hours as needed.  Dispense: 30 tablet; Refill: 0  3. Elevated serum creatinine - Basic Metabolic Panel ordered at outside clinic today. Awaiting results.   Meds ordered this encounter  Medications  . amLODipine (NORVASC) 2.5 MG tablet    Sig: Take 1 tablet (2.5 mg total) by mouth daily.    Dispense:  30 tablet    Refill:  5    Order Specific Question:   Supervising Provider    Answer:   Quentin Angst [id number]  . acetaminophen (TYLENOL) 500 MG tablet    Sig: Take 1 tablet (500 mg total) by mouth every 8 (eight) hours as needed.    Dispense:  30 tablet    Refill:  0    Order Specific Question:   Supervising Provider    Answer:   Quentin Angst [id number]    Follow-up: Return in about 1 month (around 11/09/2017) for HTN and left shoulder pain.   Loletta Specter PA

## 2017-10-12 NOTE — Progress Notes (Signed)
[facility] [address] [place]. [place] [address] [phone]       OFFICE FOLLOW-UP NOTE  Diane Berry Date of Birth:  12-19-1960 Medical Record Number:  [id number]   HPI: [name] is a pleasant 57 year [place] Asian lady O is seen today for initial hospital follow-up visit following hospital admission for intracerebral hemorrhage in February 2019. She is accompanied by her daughter as well as [place] languagei nterpreter was present throughout the visit.I have personally taken history and reviewed electronic medical records as well as imaging films.Diane Berry is an 57 y.o. female [place] who presents with 3 day history of headache, neck pain, generalized body ache x 3 days. Patient also has been having multiple complaints including abdominal discomfort. BP is 101 systolic on arrival. Patient admitted to medicine service from ER for SIRS workup and started on broad spectrum antibiotics. After admission and assessment by hospitalist MD, a stat CT Head was ordered which showed right lateral Intraventricular hemorrhage. Patient rceived IV hydralazine and blood pressure was brought below 751 systolic. Neurology was consulted immediately.  On assessment at 8 am patient alert, no evidence of weakness. Complaing of Berry headache and neck stiffness. Slightly confused per daughter. Patient's daughter provided [place] translation. Date last known well: 3 days ago tPA Given: no, ICH NIHSS: 0 Baseline MRS 0. ICH score 1  CT-scan of the abdomen scan of the head showed acute hemorrhage in the right lateral ventricle with volume measurement of 4.5 mL but no hydrocephalus. Multiple chronic lacunar infarcts noted on the MRI and stable appearance of the hemorrhage. Patient complained of Berry neck pain posterior headache and she had a MRI scan of the cervical spine which only showed tiny C6-7 disc protrusion without stenosis and disc degenerative changes at C5-6 with mild to  moderate spinal stenosis.CT angiogram showed Berry intracranial atherosclerotic changes but no vascular occlusion, AVM or aneurysm.Cerebral catheter angiogram also did not show any aneurysm or AVM.she was initially kept in the ICU where blood pressure was closely monitored. Neurosurgery was consulted but patient did not require any neurosurgical intervention. LDL cholesterol was 117 mg percent. Albumin A1c was 5.1. The etiology of the patient's interest  ventricle hemorrhage was felt to be indeterminate as the blood pressure was not particularly elevated. She was seen by physical occupational therapyand felt to be safe to be discharged home with home therapies. The patient states she's done well since his own home. She is able to resume most of her prior activities. She does not having any significant memory or cognitive issues. Her gait and balance are fine and she has had no falls. The patient was placed on adequate Dyazide and lisinopril but due to worsening kidney function lisinopril has been discontinued. She is still complaining of right temporal headaches which are mild but present most of the time. She denies any loss of vision but does complain of some itching and burning in her eyes. She is also complaining of left shoulder and leg and arm pain as well. She is currently on Topamax 25 mg once a day for headaches.  ROS:   14 system review of systems is positive for eye pain,   light sensitivity, eye pain, cough, abdominal pain, headache, speech difficulty and all other systems negative  PMH:  Past Medical History:  Diagnosis Date  . Stroke ([employer]) 07/24/2017   intraventricular hemorrhage    Social History:  Social History   Socioeconomic History  . Marital status: Widowed  Spouse name: Not on file  . Number of children: Not on file  . Years of education: Not on file  . Highest education level: Not on file  Occupational History  . Not on file  Social Needs  . Financial resource  strain: Not on file  . Food insecurity:    Worry: Not on file    Inability: Not on file  . Transportation needs:    Medical: Not on file    Non-medical: Not on file  Tobacco Use  . Smoking status: Never Smoker  . Smokeless tobacco: Never Used  Substance and Sexual Activity  . Alcohol use: No    Frequency: Never  . Drug use: No  . Sexual activity: Not on file  Lifestyle  . Physical activity:    Days per week: Not on file    Minutes per session: Not on file  . Stress: Not on file  Relationships  . Social connections:    Talks on phone: Not on file    Gets together: Not on file    Attends religious service: Not on file    Active member of club or organization: Not on file    Attends meetings of clubs or organizations: Not on file    Relationship status: Not on file  . Intimate partner violence:    Fear of current or ex partner: Not on file    Emotionally abused: Not on file    Physically abused: Not on file    Forced sexual activity: Not on file  Other Topics Concern  . Not on file  Social History Narrative  . Not on file    Medications:   Current Outpatient Medications on File Prior to Visit  Medication Sig Dispense Refill  . atorvastatin (LIPITOR) 40 MG tablet Take 1 tablet (40 mg total) by mouth daily at 6 PM. 30 tablet 5  . hydrochlorothiazide (HYDRODIURIL) 25 MG tablet Take 1 tablet (25 mg total) by mouth daily. 30 tablet 5  . omeprazole (PRILOSEC) 40 MG capsule Take 1 capsule (40 mg total) by mouth daily. 30 capsule 3  . polyethylene glycol powder (GLYCOLAX/MIRALAX) powder Take 17 g by mouth daily. 3350 g 1  . lisinopril (PRINIVIL,ZESTRIL) 10 MG tablet Take 1 tablet (10 mg total) by mouth daily. (Patient not taking: Reported on 10/12/2017) 30 tablet 5   No current facility-administered medications on file prior to visit.     Allergies:  No Known Allergies  Physical Exam General: frail petite middle-aged Asian lady seated, in no evident distress Head: head  normocephalic and atraumatic.  Neck: supple with no carotid or supraclavicular bruits Cardiovascular: regular rate and rhythm, no murmurs Musculoskeletal: no deformity Skin:  no rash/petichiae Vascular:  Normal pulses all extremities Vitals:   10/12/17 0827  BP: (!) 145/87  Pulse: 77   Neurologic Exam Mental Status: Awake and fully alert. Oriented to place and time. Recent and remote memory intact. Attention span, concentration and fund of knowledge appropriate. Mood and affect appropriate.  Cranial Nerves: Fundoscopic exam reveals sharp disc margins. Pupils equal, briskly reactive to light. Extraocular movements full without nystagmus. Visual fields full to confrontation. Hearing intact. Facial sensation intact. Face, tongue, palate moves normally and symmetrically.  Motor: Normal bulk and tone. Normal strength in all tested extremity muscles. Sensory.: intact to touch ,pinprick .position and vibratory sensation.  Coordination: Rapid alternating movements normal in all extremities. Finger-to-nose and heel-to-shin performed accurately bilaterally. Gait and Station: Arises from chair without difficulty. Stance is normal. Gait  demonstrates normal stride length and balance . Able to heel, toe and tandem walk without difficulty.  Reflexes: 1+ and symmetric. Toes downgoing.   NIHSS 0 Modified [place]in  1  ASSESSMENT: 57 year old Asian lady with intracerebral hemorrhage in 12/25/2017 who is doing quite well but has persistent temporal headaches.    PLAN: I had a long discussion with the patient and her daughter using an interpreter who was present throughout this visit. I explained that the etiology of her intraventricular hemorrhage was still indeterminate. Since she is still having persistent temporal headaches I would recommend repeating MRI scan of the brain with and without contrast. Check ESR, CBC and basic metabolic panel labs. Increase Topamax dose to 25 mg twice daily. Maintain strict  control of hypertension with blood pressure goal below 130/90. I encouraged patient to be active and exercise regularly. She will return for follow-up in 3 months with my nurse practitioner Janett Billow or call earlier if necessary Greater than 50% of time during this 25 minute visit was spent on counseling,explanation of diagnosis of intracerebral hemorrhage, planning of further management, discussion with patient and family and coordination of care Antony Contras, MD  [facility] [address] [place], [place] [address]  Phone [phone] Fax [phone] Note: This document was prepared with digital dictation and possible smart phrase technology. Any transcriptional errors that result from this process are unintentional

## 2017-10-13 ENCOUNTER — Ambulatory Visit (INDEPENDENT_AMBULATORY_CARE_PROVIDER_SITE_OTHER): Payer: Self-pay | Admitting: Physician Assistant

## 2017-10-13 LAB — BASIC METABOLIC PANEL
BUN/Creatinine Ratio: 21 (ref 9–23)
BUN: 25 mg/dL — AB (ref 6–24)
CALCIUM: 9.9 mg/dL (ref 8.7–10.2)
CHLORIDE: 102 mmol/L (ref 96–106)
CO2: 23 mmol/L (ref 20–29)
Creatinine, Ser: 1.21 mg/dL — ABNORMAL HIGH (ref 0.57–1.00)
GFR, EST AFRICAN AMERICAN: 57 mL/min/{1.73_m2} — AB (ref >59)
GFR, EST NON AFRICAN AMERICAN: 49 mL/min/{1.73_m2} — AB (ref >59)
Glucose: 100 mg/dL — ABNORMAL HIGH (ref 65–99)
POTASSIUM: 3.5 mmol/L (ref 3.5–5.2)
Sodium: 144 mmol/L (ref 134–144)

## 2017-10-13 LAB — CBC
HEMATOCRIT: 33.1 % — AB (ref 34.0–46.6)
Hemoglobin: 10 g/dL — ABNORMAL LOW (ref 11.1–15.9)
MCH: 22.7 pg — ABNORMAL LOW (ref 26.6–33.0)
MCHC: 30.2 g/dL — ABNORMAL LOW (ref 31.5–35.7)
MCV: 75 fL — AB (ref 79–97)
PLATELETS: 211 10*3/uL (ref 150–379)
RBC: 4.4 x10E6/uL (ref 3.77–5.28)
RDW: 16.1 % — AB (ref 12.3–15.4)
WBC: 5.8 10*3/uL (ref 3.4–10.8)

## 2017-10-13 LAB — SEDIMENTATION RATE: SED RATE: 16 mm/h (ref 0–40)

## 2017-10-14 ENCOUNTER — Telehealth: Payer: Self-pay

## 2017-10-14 NOTE — Telephone Encounter (Signed)
-----  Message from Garvin Fila, MD sent at 10/13/2017  8:22 AM EDT ----- Mitchell Heir inform patient that ESR and metabolic panel and CBC labs were all normal

## 2017-10-14 NOTE — Telephone Encounter (Signed)
Notes recorded by Hildred Alamin, RN on 10/14/2017 at 11:57 AM EDT Left vm for patients daughter on dpr. Pt cannot speak english. Rn requested call back about lab work results. ------

## 2017-10-21 NOTE — Telephone Encounter (Signed)
Notes recorded by Hildred Alamin, RN on 10/21/2017 at 2:08 PM EDT Left 2nd vm for patients daughter to call back her fathers labs. ------

## 2017-11-18 ENCOUNTER — Ambulatory Visit (INDEPENDENT_AMBULATORY_CARE_PROVIDER_SITE_OTHER): Payer: Self-pay | Admitting: Physician Assistant

## 2017-11-18 MED FILL — TOPIRAMATE 25 MG TABS: 25 | 30 days supply | Qty: 30 | Fill #3

## 2017-11-18 MED FILL — AMLODIPINE 2.5 MG TABLET: 2.5 | 30 days supply | Qty: 30 | Fill #1

## 2017-11-18 MED FILL — TOPIRAMATE 25 MG TABS: 25 | 30 days supply | Qty: 60 | Fill #1

## 2017-11-18 MED FILL — OMEPRAZOLE DR 40 MG CAPSULE: 40 | 30 days supply | Qty: 30 | Fill #3

## 2017-11-18 MED FILL — POLYETHYLENE GLYCOL 3350 PO: 30 days supply | Qty: 510 | Fill #3

## 2017-11-18 MED FILL — ATORVASTATIN CALCIUM 40 MG: 40 | 30 days supply | Qty: 30 | Fill #3

## 2017-11-18 MED FILL — HYDROCHLOROTHIAZIDE 25 MG T: 25 | 30 days supply | Qty: 30 | Fill #3

## 2018-01-03 MED FILL — TOPIRAMATE 25 MG TABS: 25 | 30 days supply | Qty: 60 | Fill #2

## 2018-01-03 MED FILL — AMLODIPINE 2.5 MG TABLET: 2.5 | 30 days supply | Qty: 30 | Fill #2

## 2018-01-03 MED FILL — HYDROCHLOROTHIAZIDE 25 MG T: 25 | 30 days supply | Qty: 30 | Fill #4

## 2018-01-03 MED FILL — ATORVASTATIN CALCIUM 40 MG: 40 | 30 days supply | Qty: 30 | Fill #4

## 2018-01-04 ENCOUNTER — Other Ambulatory Visit: Payer: Self-pay

## 2018-01-04 DIAGNOSIS — G8929 Other chronic pain: Secondary | ICD-10-CM

## 2018-01-04 DIAGNOSIS — R1013 Epigastric pain: Principal | ICD-10-CM

## 2018-01-04 MED ORDER — OMEPRAZOLE 40 MG PO CPDR: 40.0000 mg | DELAYED_RELEASE_CAPSULE | Freq: Every day | ORAL | 3 refills | Status: DC

## 2018-01-05 ENCOUNTER — Other Ambulatory Visit (INDEPENDENT_AMBULATORY_CARE_PROVIDER_SITE_OTHER): Payer: Self-pay | Admitting: Physician Assistant

## 2018-01-05 DIAGNOSIS — R1013 Epigastric pain: Principal | ICD-10-CM

## 2018-01-05 DIAGNOSIS — G8929 Other chronic pain: Secondary | ICD-10-CM

## 2018-01-05 MED FILL — OMEPRAZOLE DR 40 MG CAPSULE: 40 | 30 days supply | Qty: 30 | Fill #0

## 2018-01-12 NOTE — Progress Notes (Signed)
[facility] [address] [place]. [place] [address] [phone]       OFFICE FOLLOW-UP NOTE  [name] Lan Date of Birth:  03/22/1961 Medical Record Number:  [id number]   HPI: [name] is a pleasant 58 year [place] Asian lady O is seen today for initial hospital follow-up visit following hospital admission for intracerebral hemorrhage in February 2019. She is accompanied by her daughter as well as [place] languagei nterpreter was present throughout the visit.I have personally taken history and reviewed electronic medical records as well as imaging films.Diane Berry is an 57 y.o. female [place] who presents with 3 day history of headache, neck pain, generalized body ache x 3 days. Patient also has been having multiple complaints including abdominal discomfort. BP is 190 systolic on arrival. Patient admitted to medicine service from ER for SIRS workup and started on broad spectrum antibiotics. After admission and assessment by hospitalist MD, a stat CT Head was ordered which showed right lateral Intraventricular hemorrhage. Patient rceived IV hydralazine and blood pressure was brought below 140 systolic. Neurology was consulted immediately. On assessment at 8 am patient alert, no evidence of weakness. Complaing of severe headache and neck stiffness. Slightly confused per daughter. Patient's daughter provided [place] translation. Date last known well: 3 days ago tPA Given: no, ICH NIHSS: 0 Baseline MRS 0. ICH score 1  CT-scan of the abdomen scan of the head showed acute hemorrhage in the right lateral ventricle with volume measurement of 4.5 mL but no hydrocephalus. Multiple chronic lacunar infarcts noted on the MRI and stable appearance of the hemorrhage. Patient complained of severe neck pain posterior headache and she had a MRI scan of the cervical spine which only showed tiny C6-7 disc protrusion without stenosis and disc degenerative changes at C5-6 with mild to  moderate spinal stenosis.CT angiogram showed severe intracranial atherosclerotic changes but no vascular occlusion, AVM or aneurysm.Cerebral catheter angiogram also did not show any aneurysm or AVM.she was initially kept in the ICU where blood pressure was closely monitored. Neurosurgery was consulted but patient did not require any neurosurgical intervention. LDL cholesterol was 117 mg percent. Albumin A1c was 5.1. The etiology of the patient's ventricle hemorrhage was felt to be indeterminate as the blood pressure was not particularly elevated. She was seen by physical occupational therapyand felt to be safe to be discharged home with home therapies. The patient states she's done well since his own home. She is able to resume most of her prior activities. She does not having any significant memory or cognitive issues. Her gait and balance are fine and she has had no falls. The patient was placed on adequate Dyazide and lisinopril but due to worsening kidney function lisinopril has been discontinued. She is still complaining of right temporal headaches which are mild but present most of the time. She denies any loss of vision but does complain of some itching and burning in her eyes. She is also complaining of left shoulder and leg and arm pain as well. She is currently on Topamax 25 mg once a day for headaches.  Interval History: Patient returns today for follow-up visit and is accompanied by her daughter and interpreter.  She states headaches have improved where she only has occasional headaches.  She does consistently complain of abdominal pain and states when she has increased abdominal pain this can make her head hurt at times.  She has been compliant with Topamax 25 mg twice daily.  She did not undergo MRI study  stating they were never contacted to schedule appointment but per our notes, we are unable to reach daughter or patient to schedule appointment.  At this time, MRI is no longer needed as headaches  are infrequent.  Continues to take Lipitor without side effects of myalgias.  Blood pressure today satisfactory 118/84.  Patient is currently living with daughter but she is able to do all ADLs.  Daughter also states that she has been complaining of burning upon urination for the past 3 to 4 days and recommended to follow-up with PCP in regards to this along with continued abdominal pain.  Denies new or worsening stroke/TIA symptoms.     ROS:   14 system review of systems is positive for abdominal pain, painful urination and frequency of urination and all other systems negative  PMH:  Past Medical History:  Diagnosis Date  . Stroke ([facility]) 07/24/2017   intraventricular hemorrhage    Social History:  Social History   Socioeconomic History  . Marital status: Widowed    Spouse name: Not on file  . Number of children: Not on file  . Years of education: Not on file  . Highest education level: Not on file  Occupational History  . Not on file  Social Needs  . Financial resource strain: Not on file  . Food insecurity:    Worry: Not on file    Inability: Not on file  . Transportation needs:    Medical: Not on file    Non-medical: Not on file  Tobacco Use  . Smoking status: Never Smoker  . Smokeless tobacco: Never Used  Substance and Sexual Activity  . Alcohol use: No    Frequency: Never  . Drug use: No  . Sexual activity: Not Currently  Lifestyle  . Physical activity:    Days per week: Not on file    Minutes per session: Not on file  . Stress: Not on file  Relationships  . Social connections:    Talks on phone: Not on file    Gets together: Not on file    Attends religious service: Not on file    Active member of club or organization: Not on file    Attends meetings of clubs or organizations: Not on file    Relationship status: Not on file  . Intimate partner violence:    Fear of current or ex partner: Not on file    Emotionally abused: Not on file    Physically abused:  Not on file    Forced sexual activity: Not on file  Other Topics Concern  . Not on file  Social History Narrative  . Not on file    Medications:   Current Outpatient Medications on File Prior to Visit  Medication Sig Dispense Refill  . amLODipine (NORVASC) 2.5 MG tablet Take 1 tablet (2.5 mg total) by mouth daily. 30 tablet 5  . atorvastatin (LIPITOR) 40 MG tablet Take 1 tablet (40 mg total) by mouth daily at 6 PM. 30 tablet 5  . hydrochlorothiazide (HYDRODIURIL) 25 MG tablet Take 1 tablet (25 mg total) by mouth daily. 30 tablet 5  . omeprazole (PRILOSEC) 40 MG capsule Take 1 capsule (40 mg total) by mouth daily. 30 capsule 3  . polyethylene glycol powder (GLYCOLAX/MIRALAX) powder Take 17 g by mouth daily. 3350 g 1  . topiramate (TOPAMAX) 25 MG tablet Take 1 tablet (25 mg total) by mouth 2 (two) times daily. 30 tablet 5  . acetaminophen (TYLENOL) 500 MG tablet Take  1 tablet (500 mg total) by mouth every 8 (eight) hours as needed. (Patient not taking: Reported on 01/13/2018) 30 tablet 0   No current facility-administered medications on file prior to visit.     Allergies:  No Known Allergies  Physical Exam General: frail petite middle-aged Asian lady seated, in no evident distress Head: head normocephalic and atraumatic.  Neck: supple with no carotid or supraclavicular bruits Cardiovascular: regular rate and rhythm, no murmurs Musculoskeletal: no deformity Skin:  no rash/petichiae Vascular:  Normal pulses all extremities Vitals:   01/13/18 0936  BP: 118/84  Pulse: 76   Neurologic Exam Mental Status: Awake and fully alert. Oriented to place and time. Recent and remote memory intact. Attention span, concentration and fund of knowledge appropriate. Mood and affect appropriate.  Cranial Nerves: Fundoscopic exam reveals sharp disc margins. Pupils equal, briskly reactive to light. Extraocular movements full without nystagmus. Visual fields full to confrontation. Hearing intact. Facial  sensation intact. Face, tongue, palate moves normally and symmetrically.  Motor: Normal bulk and tone. Normal strength in all tested extremity muscles. Sensory.: intact to touch ,pinprick .position and vibratory sensation.  Coordination: Rapid alternating movements normal in all extremities. Finger-to-nose and heel-to-shin performed accurately bilaterally. Gait and Station: Arises from chair without difficulty. Stance is normal. Gait demonstrates normal stride length and balance . Able to heel, toe and tandem walk without difficulty.  Reflexes: 1+ and symmetric. Toes downgoing.   ASSESSMENT: 57 year old Asian lady with intracerebral hemorrhage in 12/25/2017 who is doing quite well but has persistent temporal headaches.  Patient returns today for follow-up visit and overall is doing well with minimal headaches.  Does have complaints of abdominal pain and burning with urination and recommended for PCP follow-up regarding these concerns.    PLAN: -Continue Lipitor for secondary stroke prevention -F/u with PCP regarding your HLD and HTN management -Recommended calling PCP in regards to continued abdominal pain and urinary complaints to be tested for possible UTI -MRI no longer indicated at this time as persistent headaches have resolved -Recommended continued Topamax 25 mg twice daily at this time for headache prevention -continue to monitor BP at home -Advised to continue to stay active and maintain a healthy diet -Maintain strict control of hypertension with blood pressure goal below 130/90, diabetes with hemoglobin A1c goal below 6.5% and cholesterol with LDL cholesterol (bad cholesterol) goal below 70 mg/dL. I also advised the patient to eat a healthy diet with plenty of whole grains, cereals, fruits and vegetables, exercise regularly and maintain ideal body weight.  Follow up in 6 months or call earlier if needed  Greater than 50% of time during this 25 minute visit was spent on  counseling,explanation of diagnosis of intracerebral hemorrhage, planning of further management, discussion with patient and family and coordination of care  George Hugh, [facility]  [facility] [address] [place], [place] [address]  Phone [phone] Fax [phone] Note: This document was prepared with digital dictation and possible smart phrase technology. Any transcriptional errors that result from this process are unintentional.

## 2018-01-13 ENCOUNTER — Encounter: Payer: Self-pay | Admitting: Adult Health

## 2018-01-13 ENCOUNTER — Ambulatory Visit: Payer: Self-pay | Admitting: Adult Health

## 2018-01-13 VITALS — BP 118/84 | HR 76 | Ht <= 58 in | Wt 98.4 lb

## 2018-01-13 DIAGNOSIS — I10 Essential (primary) hypertension: Secondary | ICD-10-CM

## 2018-01-13 DIAGNOSIS — I611 Nontraumatic intracerebral hemorrhage in hemisphere, cortical: Secondary | ICD-10-CM

## 2018-01-13 DIAGNOSIS — E785 Hyperlipidemia, unspecified: Secondary | ICD-10-CM

## 2018-01-13 NOTE — Patient Instructions (Signed)
Continue lipitor  for secondary stroke prevention  Continue to follow up with PCP regarding cholesterol and blood pressure management - follow up with primary regarding continued stomach pains along with burning of urination   Continue to stay active and maintain a healthy diet  Continue to monitor blood pressure at home  Maintain strict control of hypertension with blood pressure goal below 130/90, diabetes with hemoglobin A1c goal below 6.5% and cholesterol with LDL cholesterol (bad cholesterol) goal below 70 mg/dL. I also advised the patient to eat a healthy diet with plenty of whole grains, cereals, fruits and vegetables, exercise regularly and maintain ideal body weight.  Followup in the future with me in 6 months or call earlier if needed       Thank you for coming to see [facility] at [facility]. I hope we have been able to provide you high quality care today.  You may receive a patient satisfaction survey over the next few weeks. We would appreciate your feedback and comments so that we may continue to improve ourselves and the health of our patients.

## 2018-01-20 NOTE — Progress Notes (Signed)
I agree with the above plan 

## 2018-01-27 ENCOUNTER — Ambulatory Visit: Payer: Self-pay | Attending: Family Medicine

## 2018-01-28 MED FILL — TOPIRAMATE 25 MG TABS: 25 | 30 days supply | Qty: 30 | Fill #4

## 2018-01-28 MED FILL — ATORVASTATIN CALCIUM 40 MG: 40 | 30 days supply | Qty: 30 | Fill #5

## 2018-01-28 MED FILL — HYDROCHLOROTHIAZIDE 25 MG T: 25 | 30 days supply | Qty: 30 | Fill #5

## 2018-01-28 MED FILL — AMLODIPINE 2.5 MG TABLET: 2.5 | 30 days supply | Qty: 30 | Fill #3

## 2018-02-03 ENCOUNTER — Ambulatory Visit: Payer: Self-pay | Attending: Physician Assistant

## 2018-02-03 MED FILL — POLYETHYLENE GLYCOL 3350 PO: 28 days supply | Qty: 476 | Fill #4

## 2018-02-03 MED FILL — OMEPRAZOLE DR 40 MG CAPSULE: 40 | 30 days supply | Qty: 30 | Fill #1

## 2018-03-03 ENCOUNTER — Encounter (INDEPENDENT_AMBULATORY_CARE_PROVIDER_SITE_OTHER): Payer: Self-pay | Admitting: Physician Assistant

## 2018-03-03 ENCOUNTER — Ambulatory Visit (INDEPENDENT_AMBULATORY_CARE_PROVIDER_SITE_OTHER): Payer: Self-pay | Admitting: Physician Assistant

## 2018-03-03 VITALS — BP 137/78 | HR 75 | Temp 97.9°F | Ht <= 58 in | Wt 101.2 lb

## 2018-03-03 DIAGNOSIS — R350 Frequency of micturition: Secondary | ICD-10-CM

## 2018-03-03 DIAGNOSIS — Z760 Encounter for issue of repeat prescription: Secondary | ICD-10-CM

## 2018-03-03 DIAGNOSIS — Z1239 Encounter for other screening for malignant neoplasm of breast: Secondary | ICD-10-CM

## 2018-03-03 DIAGNOSIS — Z23 Encounter for immunization: Secondary | ICD-10-CM

## 2018-03-03 DIAGNOSIS — Z1231 Encounter for screening mammogram for malignant neoplasm of breast: Secondary | ICD-10-CM

## 2018-03-03 DIAGNOSIS — M25512 Pain in left shoulder: Secondary | ICD-10-CM

## 2018-03-03 DIAGNOSIS — M545 Low back pain, unspecified: Secondary | ICD-10-CM

## 2018-03-03 DIAGNOSIS — Z1211 Encounter for screening for malignant neoplasm of colon: Secondary | ICD-10-CM

## 2018-03-03 DIAGNOSIS — I10 Essential (primary) hypertension: Secondary | ICD-10-CM

## 2018-03-03 LAB — POCT URINALYSIS DIPSTICK
Bilirubin, UA: NEGATIVE
GLUCOSE UA: NEGATIVE
Ketones, UA: NEGATIVE
LEUKOCYTES UA: NEGATIVE
NITRITE UA: NEGATIVE
Protein, UA: NEGATIVE
RBC UA: NEGATIVE
SPEC GRAV UA: 1.015 (ref 1.010–1.025)
Urobilinogen, UA: 0.2 E.U./dL
pH, UA: 7 (ref 5.0–8.0)

## 2018-03-03 MED ORDER — ACETAMINOPHEN 500 MG PO TABS: 500.0000 mg | ORAL_TABLET | Freq: Three times a day (TID) | ORAL | 0 refills | Status: DC | PRN

## 2018-03-03 MED ORDER — OMEPRAZOLE 40 MG PO CPDR: 40.0000 mg | DELAYED_RELEASE_CAPSULE | Freq: Every day | ORAL | 5 refills | Status: DC

## 2018-03-03 MED ORDER — TOPIRAMATE 25 MG PO TABS: 25.0000 mg | ORAL_TABLET | Freq: Two times a day (BID) | ORAL | 5 refills | Status: DC

## 2018-03-03 MED ORDER — HYDROCHLOROTHIAZIDE 25 MG PO TABS: 25.0000 mg | ORAL_TABLET | Freq: Every day | ORAL | 5 refills | Status: DC

## 2018-03-03 MED ORDER — AMLODIPINE BESYLATE 2.5 MG PO TABS: 2.5000 mg | ORAL_TABLET | Freq: Every day | ORAL | 5 refills | Status: DC

## 2018-03-03 MED FILL — HYDROCHLOROTHIAZIDE 25 MG T: 25 | 30 days supply | Qty: 30 | Fill #0

## 2018-03-03 MED FILL — OMEPRAZOLE DR 40 MG CAPSULE: 40 | 30 days supply | Qty: 30 | Fill #0

## 2018-03-03 MED FILL — AMLODIPINE 2.5 MG TABLET: 2.5 | 30 days supply | Qty: 30 | Fill #0

## 2018-03-03 MED FILL — TOPIRAMATE 25 MG TABS: 25 | 15 days supply | Qty: 30 | Fill #0

## 2018-03-03 NOTE — Patient Instructions (Signed)
?au l?ng, Ng??i l?n  Back Pain, Adult  Nhi?u ng??i l?n th?nh tho?ng b? ?au l?ng. Các nguyên nhân ph? bi?n gây ?au l?ng bao g?m:  · Dây ch?ng ho?c c? b? c?ng.  · Mòn và rách (thoái hóa) ??a ??m c?t s?ng.  · Viêm kh?p.  · Cú ?ánh vào l?ng.    ?au l?ng có th? di?n ra trong th?i gian ng?n (c?p tính) ho?c trong th?i gian dài (m?n tính). Khám th?c th?, xét nghi?m trong phòng thí nghi?m và nghiên c?u hình ?nh có th? ???c th?c hi?n ?? tìm ra nguyên nhân gây ?au l?ng.  Tuân th? nh?ng h??ng d?n này ? nhà:  X? trí ?au và c?ng kh?p.  · Ch? s? d?ng thu?c không kê ??n và thu?c kê ??n theo ch? d?n c?a chuyên gia ch?m sóc s?c kh?e.  · Nê?u ???c chi? dâ?n, ha?y ch???m no?ng va?o vu?ng bi? ?au th??ng xuyên nh? chi? dâ?n cu?a chuyên gia ch?m so?c s??c kho?e. S? d?ng ngu?n nhi?t mà chuyên gia ch?m sóc s?c kh?e khuy?n ngh?, ch?ng h?n nh? túi ch??m nhi?t ?m ho?c ??m ch??m nóng.  ? ?? kh?n t?m ? gi?a da và ngu?n nhi?t.  ? Duy trì ngu?n nhi?t trong 20-30 phút.  ? B? ngu?n nhi?t ra n?u da quý v? chuy?n sang màu ?? nh?t. ?i?u này ??c bi?t quan tr?ng n?u quý v? không th? c?m th?y ?au, nóng, hay l?nh. Quý v? có nguy c? b? b?ng cao h?n.  · N?u ???c ch? d?n, ch??m ?á l?nh vào vùng b? t?n th??ng:  ? Cho ?á l?nh vào túi ni lông.  ? ?? kh?n t?m ? gi?a da và túi ch??m.  ? ?? ?á l?nh trong kho?ng 20 phút, 2-3 l?n m?i ngày trong 2-3 ngày ??u.  Ho?t ??ng  · Không n?m l?i trên gi??ng. Ngh? ng?i quá 1-2 ngày có th? la?m châ?m viê?c ph?c h?i c?a quy? vi?.  · ?i b? m?t ?o?n ng?n trên b? m?t ph?ng ngay khi có th?. C? g?ng t?ng th?i gian ?i b? m?i ngày.  · Không ng?i, la?i xe, ho?c ??ng m?t chô? quá 30 phút mô?i lâ?n. Ng?i ho?c ??ng trong th?i gian dài có th? gây áp l?c lên l?ng quý v?.  · S? d?ng các k? thu?t nâng phù h?p. Khi quý v? cúi xu?ng và nâng ?? v?t, áp d?ng các t? th? gây ít áp l?c h?n lên l?ng quý v?:  ? Cong ?â?u gô?i.  ? Gi?? cho vâ?t n??ng gâ?n c? thê? quy? vi?.  ? Tránh v?n ng??i.  · T?p th? d?c th??ng xuyên theo ch? d?n c?a chuyên gia ch?m  sóc s?c kh?e. T?p th? d?c s? giúp l?ng quý v? h?i ph?c nhanh h?n. T?p th? d?c c?ng giúp phòng ng?a ch?n th??ng l?ng do làm cho c? kh?e m?nh và linh ho?t.  · Chuyên gia ch?m sóc s?c kh?e có th? khuyên quý v? ??n g?p bác s? v?t lý tr? li?u. Bác s? này có th? giúp ??a ra m?t ch??ng trình t?p luy?n an toàn. T?p theo b?t c? bài t?p nào do bác s? v?t lý tr? li?u ch? d?n.  L?i s?ng  · Duy trì m??c cân n?ng có l?i cho s?c kh?e. Th?a cân gây a?p l??c cho l?ng quy? vi? va? la?m quý v? khó duy tri? t? thê? tô?t.  · Tránh các ho?t ??ng ho?c tình hu?ng khi?n quý v? c?m th?y lo l?ng ho?c c?ng th?ng. H?c cách qu?n lý lo âu và   c?ng th?ng. M?t cách ?? qu?n lý c?ng th?ng là thông qua t?p th? d?c. C?ng th?ng và lo âu làm c? c?ng h?n và có th? khi?n tình tr?ng ?au l?ng tr?m tr?ng h?n.  H??ng d?n chung  · Ng? trên n?m c?ng v?i t? th? tho?i mái. C? g?ng n?m nghiêng v?i ??u g?i h?i cong. N?u quy? vi? n??m ng??a, ??t m?t chi?c g?i d??i ??u g?i c?a quy? vi?.  · Tuân th? k? ho?ch ?i?u tr? c?a quý v? theo ch? d?n c?a chuyên gia ch?m sóc s?c kh?e. ?i?u tr? này có th? bao g?m:  ? Li?u pháp nh?n th?c ho?c hành vi.  ? Châm c?u ho?c li?u pháp xoa bóp.  ? Thi?n ho?c yoga.  Hãy liên l?c v?i chuyên gia ch?m sóc s?c kh?e n?u:  · Quý v? b? ?au ma? không thuyên gi?m sau khi nghi? ng?i ho??c dùng thu?c.  · Quy? vi? bi? ?au t?ng lên lan xu?ng d???i chân ho?c mông.  · C?n ?au c?a quý v? không ?? trong 2 tu?n.  · Quy? vi? bi? ?au va?o ban ?êm.  · Quý v? b? s?t cân.  · Quý v? b? s?t ho?c ?n l?nh.  Yêu c?u tr? giúp ngay l?p t?c n?u:  · Quy? vi? bi? nh??ng vâ?n ?ê? m??i vê? kiê?m soa?t ?a?i tiê?n ho??c tiê?u tiê?n.  · Quý v? b? yê?u ho??c tê bì bâ?t th???ng ?? tay ho?c chân.  · Quy? vi? bu?n nôn ho?c nôn.  · Quý v? b? ?au b?ng.  · Quy? vi? ca?m thâ?y bi? ngâ?t.  Tóm t?t  · Nhi?u ng??i l?n th?nh tho?ng b? ?au l?ng. Khám th?c th?, xét nghi?m trong phòng thí nghi?m và nghiên c?u hình ?nh có th? ???c th?c hi?n ?? tìm ra nguyên nhân gây ?au l?ng.  · S? d?ng các k?  thu?t nâng phù h?p. Khi quý v? cúi xu?ng và nâng, áp d?ng các t? th? gây ít áp l?c h?n lên l?ng c?a quý v?.  · S? d?ng thu?c không kê ??n và thu?c kê ??n và ch??m nóng ho?c ?á l?nh theo ch? d?n c?a chuyên gia ch?m sóc s?c kh?e.  Thông tin này không nh?m m?c ?ích thay th? cho l?i khuyên mà chuyên gia ch?m sóc s?c kh?e nói v?i quý v?. Hãy b?o ??m quý v? ph?i th?o lu?n b?t k? v?n ?? gì mà quý v? có v?i chuyên gia ch?m sóc s?c kh?e c?a quý v?.  Document Released: 09/16/2015 Document Revised: 09/30/2016 Document Reviewed: 09/30/2016  Elsevier Interactive Patient Education © 2018 [employer]

## 2018-03-03 NOTE — Progress Notes (Signed)
Subjective:  Patient ID: Diane Berry, female    DOB: Apr 24, 1961  Age: 57 y.o. MRN: [id number]  CC: f/u HTN and left shoulder pain  HPI Diane Berry a 57 y.o.femalewith a medical history of stroke (intraventricular hemorrhage) and GERD with esophagitis presents on f/u of HTN. Last BP 149/89 mmHg nearly five months ago. Pt taking HCTZ 25 and amlodipine 2.5 mg. BP 118/84 mmHg during a neurology visit last month. BP 137/78 mmHg today. Pt's daughter accompanies patient and says she administers drugs to patient. Daughter also restricts salt intake for patient.    Patient previously complained of left shoulder pain, prescribed Tylenol 500 mg q8hrs and had XR left shoulder ordered. XR of left shoulder was never done. Pt states she is well in regards to left shoulder pain after having taken tylenol.     Complains today of left lower back pain with associated urinary frequency and suprapubic pressure. Does not endorse f/c/n/v or hematuria.  Outpatient Medications Prior to Visit  Medication Sig Dispense Refill  . amLODipine (NORVASC) 2.5 MG tablet Take 1 tablet (2.5 mg total) by mouth daily. 30 tablet 5  . atorvastatin (LIPITOR) 40 MG tablet Take 1 tablet (40 mg total) by mouth daily at 6 PM. 30 tablet 5  . hydrochlorothiazide (HYDRODIURIL) 25 MG tablet Take 1 tablet (25 mg total) by mouth daily. 30 tablet 5  . omeprazole (PRILOSEC) 40 MG capsule Take 1 capsule (40 mg total) by mouth daily. 30 capsule 3  . polyethylene glycol powder (GLYCOLAX/MIRALAX) powder Take 17 g by mouth daily. 3350 g 1  . topiramate (TOPAMAX) 25 MG tablet Take 1 tablet (25 mg total) by mouth 2 (two) times daily. 30 tablet 5  . acetaminophen (TYLENOL) 500 MG tablet Take 1 tablet (500 mg total) by mouth every 8 (eight) hours as needed. (Patient not taking: Reported on 01/13/2018) 30 tablet 0   No facility-administered medications prior to visit.      ROS Review of Systems  Constitutional: Negative for chills, fever and  malaise/fatigue.  Eyes: Negative for blurred vision.  Respiratory: Negative for shortness of breath.   Cardiovascular: Negative for chest pain and palpitations.  Gastrointestinal: Negative for abdominal pain and nausea.  Genitourinary: Positive for frequency. Negative for dysuria and hematuria.  Musculoskeletal: Positive for back pain. Negative for joint pain and myalgias.  Skin: Negative for rash.  Neurological: Negative for tingling and headaches.  Psychiatric/Behavioral: Negative for depression. The patient is not nervous/anxious.     Objective:  BP (!) 163/85 (BP Location: Right Arm, Patient Position: Sitting, Cuff Size: Normal)   Pulse 72   Temp 97.9 F (36.6 C) (Oral)   Ht 4\' 9"  (1.448 m)   Wt 101 lb 3.2 oz (45.9 kg)   SpO2 98%   BMI 21.90 kg/m   BP/Weight 03/03/2018 01/13/2018 10/12/2017  Systolic BP 163 118 149  Diastolic BP 85 84 89  Wt. (Lbs) 101.2 98.4 102.4  BMI 21.9 21.29 19.59      Physical Exam  Constitutional: She is oriented to person, place, and time.  Well developed, well nourished, NAD, polite  HENT:  Head: Normocephalic and atraumatic.  Eyes: No scleral icterus.  Neck: Normal range of motion. Neck supple. No thyromegaly present.  Cardiovascular: Normal rate, regular rhythm and normal heart sounds.  Pulmonary/Chest: Effort normal and breath sounds normal.  Abdominal: Soft. Bowel sounds are normal. There is no tenderness.  Musculoskeletal: She exhibits no edema.  Neurological: She is alert and oriented to person,  place, and time.  Skin: Skin is warm and dry. No rash noted. No erythema. No pallor.  Psychiatric: She has a normal mood and affect. Her behavior is normal. Thought content normal.  Vitals reviewed.    Assessment & Plan:    1. Hypertension, unspecified type - Lipid panel - Comprehensive metabolic panel  2. Screening for breast cancer - MS DIGITAL SCREENING BILATERAL; Future  3. Screening for colon cancer - Fecal occult blood,  imunochemical; Future  4. Need for prophylactic vaccination and inoculation against influenza - Flu Vaccine QUAD 6+ mos PF IM (Fluarix Quad PF)  5. Acute left-sided low back pain without sciatica - Urinalysis Dipstick - acetaminophen (TYLENOL) 500 MG tablet; Take 1 tablet (500 mg total) by mouth every 8 (eight) hours as needed.  Dispense: 90 tablet; Refill: 0  6. Urinary frequency - Urinalysis Dipstick negative  7. Acute pain of left shoulder - Resolved   Meds ordered this encounter  Medications  . acetaminophen (TYLENOL) 500 MG tablet    Sig: Take 1 tablet (500 mg total) by mouth every 8 (eight) hours as needed.    Dispense:  90 tablet    Refill:  0    Order Specific Question:   Supervising Provider    Answer:   Hoy Register [[id number]]    Follow-up: Return in about 6 months (around 09/01/2018) for HTN.   Loletta Specter PA

## 2018-03-04 ENCOUNTER — Telehealth (INDEPENDENT_AMBULATORY_CARE_PROVIDER_SITE_OTHER): Payer: Self-pay

## 2018-03-04 LAB — COMPREHENSIVE METABOLIC PANEL
A/G RATIO: 1.5 (ref 1.2–2.2)
ALT: 9 IU/L (ref 0–32)
AST: 22 IU/L (ref 0–40)
Albumin: 4.7 g/dL (ref 3.5–5.5)
Alkaline Phosphatase: 112 IU/L (ref 39–117)
BUN/Creatinine Ratio: 19 (ref 9–23)
BUN: 25 mg/dL — ABNORMAL HIGH (ref 6–24)
Bilirubin Total: 0.5 mg/dL (ref 0.0–1.2)
CALCIUM: 9.9 mg/dL (ref 8.7–10.2)
CO2: 24 mmol/L (ref 20–29)
Chloride: 100 mmol/L (ref 96–106)
Creatinine, Ser: 1.32 mg/dL — ABNORMAL HIGH (ref 0.57–1.00)
GFR calc Af Amer: 51 mL/min/{1.73_m2} — ABNORMAL LOW (ref >59)
GFR, EST NON AFRICAN AMERICAN: 44 mL/min/{1.73_m2} — AB (ref >59)
GLOBULIN, TOTAL: 3.2 g/dL (ref 1.5–4.5)
Glucose: 93 mg/dL (ref 65–99)
POTASSIUM: 3.4 mmol/L — AB (ref 3.5–5.2)
SODIUM: 143 mmol/L (ref 134–144)
Total Protein: 7.9 g/dL (ref 6.0–8.5)

## 2018-03-04 LAB — LIPID PANEL
Chol/HDL Ratio: 2.3 ratio (ref 0.0–4.4)
Cholesterol, Total: 122 mg/dL (ref 100–199)
HDL: 54 mg/dL (ref >39)
LDL CALC: 47 mg/dL (ref 0–99)
TRIGLYCERIDES: 105 mg/dL (ref 0–149)
VLDL CHOLESTEROL CAL: 21 mg/dL (ref 5–40)

## 2018-03-04 NOTE — Telephone Encounter (Signed)
Vung is aware that cholesterol is well controlled on atorvastatin so continue taking that. CKD stable and potassium slightly low; supplement potassium in the diet. She will call and inform patient and her daughter. Maryjean Morn, CMA

## 2018-03-04 NOTE — Telephone Encounter (Signed)
-----   Message from Loletta Specter, PA-C sent at 03/04/2018 11:10 AM EDT ----- Cholesterol well controlled with atorvastatin. Continue statin. CKD stable. Potassium slightly low. Supplement potassium in the diet.

## 2018-03-07 NOTE — Addendum Note (Signed)
Addended by: Bronwen Betters on: 03/07/2018 09:06 AM   Modules accepted: Orders

## 2018-03-09 ENCOUNTER — Telehealth (INDEPENDENT_AMBULATORY_CARE_PROVIDER_SITE_OTHER): Payer: Self-pay

## 2018-03-09 LAB — FECAL OCCULT BLOOD, IMMUNOCHEMICAL: Fecal Occult Bld: NEGATIVE

## 2018-03-09 NOTE — Telephone Encounter (Signed)
Fit Test is negative

## 2018-04-07 MED FILL — TOPIRAMATE 25 MG TABS: 25 | 15 days supply | Qty: 30 | Fill #1

## 2018-04-07 MED FILL — OMEPRAZOLE DR 40 MG CAPSULE: 40 | 30 days supply | Qty: 30 | Fill #1

## 2018-04-07 MED FILL — AMLODIPINE BESYLATE 2.5 MG: 2.5 | 30 days supply | Qty: 30 | Fill #1

## 2018-04-07 MED FILL — HYDROCHLOROTHIAZIDE 25 MG T: 25 | 30 days supply | Qty: 30 | Fill #1

## 2018-05-11 MED FILL — ?AMLODIPINE BESYLAT 2.5MG: 2.5 | 30 days supply | Qty: 30 | Fill #2

## 2018-05-11 MED FILL — OMEPRAZOLE DR 40 MG CAPSULE: 40 | 30 days supply | Qty: 30 | Fill #2

## 2018-05-11 MED FILL — HYDROCHLOROTHIAZIDE 25 MG T: 25 | 30 days supply | Qty: 30 | Fill #2

## 2018-05-11 MED FILL — TOPIRAMATE 25 MG TABS: 25 | 15 days supply | Qty: 30 | Fill #2

## 2018-06-06 MED FILL — TOPIRAMATE 25 MG TABS: 25 | 15 days supply | Qty: 30 | Fill #3

## 2018-06-06 MED FILL — OMEPRAZOLE DR 40 MG CAPSULE: 40 | 30 days supply | Qty: 30 | Fill #3

## 2018-06-06 MED FILL — ?AMLODIPINE BESYLAT 2.5MG: 2.5 | 30 days supply | Qty: 30 | Fill #3

## 2018-06-06 MED FILL — HYDROCHLOROTHIAZIDE 25 MG T: 25 | 30 days supply | Qty: 30 | Fill #3

## 2018-07-05 MED FILL — AMLODIPINE 2.5 MG TABLET: 2.5 | 30 days supply | Qty: 30 | Fill #4

## 2018-07-05 MED FILL — HYDROCHLOROTHIAZIDE 25 MG T: 25 | 30 days supply | Qty: 30 | Fill #4

## 2018-07-05 MED FILL — ?TOPIRAMATE 25MG TABLET: 25 | 15 days supply | Qty: 30 | Fill #4

## 2018-07-05 MED FILL — OMEPRAZOLE DR 40 MG CAPSULE: 40 | 30 days supply | Qty: 30 | Fill #4

## 2018-07-21 ENCOUNTER — Ambulatory Visit: Payer: Self-pay | Admitting: Adult Health

## 2018-07-21 ENCOUNTER — Telehealth: Payer: Self-pay

## 2018-07-21 NOTE — Progress Notes (Deleted)
[facility] [address] [place]. [place] [id number] [phone]       OFFICE FOLLOW-UP NOTE  Ms. Marsia Rah Daryl EasternLan Date of Birth:  08/17/1960 Medical Record Number:  [id number]   HPI: 07/21/18 VISIT  Jenalyn Rah Daryl EasternLan is a 58 y.o. female who is being seen today for routine follow-up visit after right lateral intraventricular hemorrhage on 07/23/2017.  She is accompanied by interpreter.  She continues to do well from a stroke standpoint without residual deficits or recurring of symptoms.  She continues on atorvastatin without side effects myalgias.  Blood pressure today ***.  Denies new or worsening stroke/TIA symptoms.    HISTORY 01/13/2018: Patient returns today for follow-up visit and is accompanied by her daughter and interpreter.  She states headaches have improved where she only has occasional headaches.  She does consistently complain of abdominal pain and states when she has increased abdominal pain this can make her head hurt at times.  She has been compliant with Topamax 25 mg twice daily.  She did not undergo MRI study stating they were never contacted to schedule appointment but per our notes, we are unable to reach daughter or patient to schedule appointment.  At this time, MRI is no longer needed as headaches are infrequent.  Continues to take Lipitor without side effects of myalgias.  Blood pressure today satisfactory 118/84.  Patient is currently living with daughter but she is able to do all ADLs.  Daughter also states that she has been complaining of burning upon urination for the past 3 to 4 days and recommended to follow-up with PCP in regards to this along with continued abdominal pain.  Denies new or worsening stroke/TIA symptoms.   INITIAL VISIT 10/12/2017 PS: Ms Marcie MowersRah is a pleasant [age] year [place] Asian lady O is seen today for initial hospital follow-up visit following hospital admission for intracerebral hemorrhage in February 2019. She is accompanied by her  daughter as well as [place]namese languagei nterpreter was present throughout the visit.I have personally taken history and reviewed electronic medical records as well as imaging films.Merced Rah Daryl EasternLan is an 58 y.o. female [place] who presents with 3 day history of headache, neck pain, generalized body ache x 3 days. Patient also has been having multiple complaints including abdominal discomfort. BP is 190 systolic on arrival. Patient admitted to medicine service from ER for SIRS workup and started on broad spectrum antibiotics. After admission and assessment by hospitalist MD, a stat CT Head was ordered which showed right lateral Intraventricular hemorrhage. Patient rceived IV hydralazine and blood pressure was brought below 140 systolic. Neurology was consulted immediately. On assessment at 8 am patient alert, no evidence of weakness. Complaing of severe headache and neck stiffness. Slightly confused per daughter. Patient's daughter provided [place] translation. Date last known well: 3 days ago tPA Given: no, ICH NIHSS: 0 Baseline MRS 0. ICH score 1  CT-scan of the abdomen scan of the head showed acute hemorrhage in the right lateral ventricle with volume measurement of 4.5 mL but no hydrocephalus. Multiple chronic lacunar infarcts noted on the MRI and stable appearance of the hemorrhage. Patient complained of severe neck pain posterior headache and she had a MRI scan of the cervical spine which only showed tiny C6-7 disc protrusion without stenosis and disc degenerative changes at C5-6 with mild to moderate spinal stenosis.CT angiogram showed severe intracranial atherosclerotic changes but no vascular occlusion, AVM or aneurysm.Cerebral catheter angiogram also did not show any aneurysm or AVM.she was initially kept in  the ICU where blood pressure was closely monitored. Neurosurgery was consulted but patient did not require any neurosurgical intervention. LDL cholesterol was 117 mg percent. Albumin A1c was 5.1.  The etiology of the patient's ventricle hemorrhage was felt to be indeterminate as the blood pressure was not particularly elevated. She was seen by physical occupational therapyand felt to be safe to be discharged home with home therapies. The patient states she's done well since his own home. She is able to resume most of her prior activities. She does not having any significant memory or cognitive issues. Her gait and balance are fine and she has had no falls. The patient was placed on adequate Dyazide and lisinopril but due to worsening kidney function lisinopril has been discontinued. She is still complaining of right temporal headaches which are mild but present most of the time. She denies any loss of vision but does complain of some itching and burning in her eyes. She is also complaining of left shoulder and leg and arm pain as well. She is currently on Topamax 25 mg once a day for headaches.      ROS:   14 system review of systems is positive for abdominal pain, painful urination and frequency of urination and all other systems negative  PMH:  Past Medical History:  Diagnosis Date  . Stroke (HCC) 07/24/2017   intraventricular hemorrhage    Social History:  Social History   Socioeconomic History  . Marital status: Widowed    Spouse name: Not on file  . Number of children: Not on file  . Years of education: Not on file  . Highest education level: Not on file  Occupational History  . Not on file  Social Needs  . Financial resource strain: Not on file  . Food insecurity:    Worry: Not on file    Inability: Not on file  . Transportation needs:    Medical: Not on file    Non-medical: Not on file  Tobacco Use  . Smoking status: Never Smoker  . Smokeless tobacco: Never Used  Substance and Sexual Activity  . Alcohol use: No    Frequency: Never  . Drug use: No  . Sexual activity: Not Currently  Lifestyle  . Physical activity:    Days per week: Not on file    Minutes per  session: Not on file  . Stress: Not on file  Relationships  . Social connections:    Talks on phone: Not on file    Gets together: Not on file    Attends religious service: Not on file    Active member of club or organization: Not on file    Attends meetings of clubs or organizations: Not on file    Relationship status: Not on file  . Intimate partner violence:    Fear of current or ex partner: Not on file    Emotionally abused: Not on file    Physically abused: Not on file    Forced sexual activity: Not on file  Other Topics Concern  . Not on file  Social History Narrative  . Not on file    Medications:   Current Outpatient Medications on File Prior to Visit  Medication Sig Dispense Refill  . acetaminophen (TYLENOL) 500 MG tablet Take 1 tablet (500 mg total) by mouth every 8 (eight) hours as needed. 90 tablet 0  . amLODipine (NORVASC) 2.5 MG tablet Take 1 tablet (2.5 mg total) by mouth daily. 30 tablet 5  .  atorvastatin (LIPITOR) 40 MG tablet Take 1 tablet (40 mg total) by mouth daily at 6 PM. 30 tablet 5  . hydrochlorothiazide (HYDRODIURIL) 25 MG tablet Take 1 tablet (25 mg total) by mouth daily. 30 tablet 5  . omeprazole (PRILOSEC) 40 MG capsule Take 1 capsule (40 mg total) by mouth daily. 30 capsule 5  . polyethylene glycol powder (GLYCOLAX/MIRALAX) powder Take 17 g by mouth daily. 3350 g 1  . topiramate (TOPAMAX) 25 MG tablet Take 1 tablet (25 mg total) by mouth 2 (two) times daily. 30 tablet 5   No current facility-administered medications on file prior to visit.     Allergies:  No Known Allergies  Physical Exam General: frail petite middle-aged Asian lady seated, in no evident distress Head: head normocephalic and atraumatic.  Neck: supple with no carotid or supraclavicular bruits Cardiovascular: regular rate and rhythm, no murmurs Musculoskeletal: no deformity Skin:  no rash/petichiae Vascular:  Normal pulses all extremities There were no vitals filed for this  visit. Neurologic Exam Mental Status: Awake and fully alert. Oriented to place and time. Recent and remote memory intact. Attention span, concentration and fund of knowledge appropriate. Mood and affect appropriate.  Cranial Nerves: Fundoscopic exam reveals sharp disc margins. Pupils equal, briskly reactive to light. Extraocular movements full without nystagmus. Visual fields full to confrontation. Hearing intact. Facial sensation intact. Face, tongue, palate moves normally and symmetrically.  Motor: Normal bulk and tone. Normal strength in all tested extremity muscles. Sensory.: intact to touch ,pinprick .position and vibratory sensation.  Coordination: Rapid alternating movements normal in all extremities. Finger-to-nose and heel-to-shin performed accurately bilaterally. Gait and Station: Arises from chair without difficulty. Stance is normal. Gait demonstrates normal stride length and balance . Able to heel, toe and tandem walk without difficulty.  Reflexes: 1+ and symmetric. Toes downgoing.   ASSESSMENT: 58 year old Asian lady with intracerebral hemorrhage in 12/25/2017 who is doing quite well but has persistent temporal headaches.  Patient returns today for follow-up visit and overall is doing well with minimal headaches.  Does have complaints of abdominal pain and burning with urination and recommended for PCP follow-up regarding these concerns.    PLAN: -Continue Lipitor for secondary stroke prevention -F/u with PCP regarding your HLD and HTN management -Recommended calling PCP in regards to continued abdominal pain and urinary complaints to be tested for possible UTI -MRI no longer indicated at this time as persistent headaches have resolved -Recommended continued Topamax 25 mg twice daily at this time for headache prevention -continue to monitor BP at home -Advised to continue to stay active and maintain a healthy diet -Maintain strict control of hypertension with blood pressure goal  below 130/90, diabetes with hemoglobin A1c goal below 6.5% and cholesterol with LDL cholesterol (bad cholesterol) goal below 70 mg/dL. I also advised the patient to eat a healthy diet with plenty of whole grains, cereals, fruits and vegetables, exercise regularly and maintain ideal body weight.  Follow up in 6 months or call earlier if needed  Greater than 50% of time during this 25 minute visit was spent on counseling,explanation of diagnosis of intracerebral hemorrhage, planning of further management, discussion with patient and family and coordination of care  George HughJessica Azyiah Bo, [facility] FGNP-BC  [facility] [address] [place] [id number]  Phone [phone] Fax [phone] Note: This document was prepared with digital dictation and possible smart phrase technology. Any transcriptional errors that result from this process are unintentional.

## 2018-07-21 NOTE — Telephone Encounter (Signed)
Patient no show for appt today. 

## 2018-07-25 ENCOUNTER — Encounter: Payer: Self-pay | Admitting: Adult Health

## 2018-08-01 MED FILL — HYDROCHLOROTHIAZIDE 25 MG T: 25 | 30 days supply | Qty: 30 | Fill #5

## 2018-08-01 MED FILL — ?AMLODIPINE BESYLATE 2.5MG.: 2.5 | 30 days supply | Qty: 30 | Fill #5

## 2018-08-01 MED FILL — ?TOPIRAMATE 25MG TABLET: 25 | 15 days supply | Qty: 30 | Fill #5

## 2018-08-01 MED FILL — OMEPRAZOLE DR 40 MG CAPSULE: 40 | 30 days supply | Qty: 30 | Fill #5

## 2018-09-01 ENCOUNTER — Ambulatory Visit (INDEPENDENT_AMBULATORY_CARE_PROVIDER_SITE_OTHER): Payer: Self-pay | Admitting: Primary Care

## 2018-09-01 ENCOUNTER — Other Ambulatory Visit: Payer: Self-pay

## 2018-09-01 ENCOUNTER — Encounter (INDEPENDENT_AMBULATORY_CARE_PROVIDER_SITE_OTHER): Payer: Self-pay | Admitting: Primary Care

## 2018-09-01 VITALS — BP 147/87 | HR 70 | Temp 97.5°F | Ht <= 58 in | Wt 106.6 lb

## 2018-09-01 DIAGNOSIS — Z760 Encounter for issue of repeat prescription: Secondary | ICD-10-CM

## 2018-09-01 DIAGNOSIS — I10 Essential (primary) hypertension: Secondary | ICD-10-CM

## 2018-09-01 DIAGNOSIS — E785 Hyperlipidemia, unspecified: Secondary | ICD-10-CM

## 2018-09-01 DIAGNOSIS — G8929 Other chronic pain: Secondary | ICD-10-CM

## 2018-09-01 DIAGNOSIS — R799 Abnormal finding of blood chemistry, unspecified: Secondary | ICD-10-CM

## 2018-09-01 DIAGNOSIS — K2100 Gastro-esophageal reflux disease with esophagitis, without bleeding: Secondary | ICD-10-CM

## 2018-09-01 DIAGNOSIS — R1013 Epigastric pain: Secondary | ICD-10-CM

## 2018-09-01 MED ORDER — OMEPRAZOLE 40 MG PO CPDR: 40.0000 mg | DELAYED_RELEASE_CAPSULE | Freq: Every day | ORAL | 2 refills | Status: DC

## 2018-09-01 MED ORDER — AMLODIPINE BESYLATE 2.5 MG PO TABS: 2.5000 mg | ORAL_TABLET | Freq: Every day | ORAL | 5 refills | Status: DC

## 2018-09-01 MED ORDER — HYDROCHLOROTHIAZIDE 25 MG PO TABS: 25.0000 mg | ORAL_TABLET | Freq: Every day | ORAL | 5 refills | Status: DC

## 2018-09-01 MED ORDER — ATORVASTATIN CALCIUM 40 MG PO TABS: 40.0000 mg | ORAL_TABLET | Freq: Every day | ORAL | 5 refills | Status: DC

## 2018-09-01 MED ORDER — TOPIRAMATE 25 MG PO TABS: 25.0000 mg | ORAL_TABLET | Freq: Two times a day (BID) | ORAL | 5 refills | Status: DC

## 2018-09-01 NOTE — Progress Notes (Signed)
Established Patient Office Visit  Subjective:  Patient ID: Diane Berry, female    DOB: 1960-09-04  Age: 58 y.o. MRN: [id number]  CC:  Chief Complaint  Patient presents with   Follow-up    HTN    HPI [facility] presents for follow up for HTN needs medication refill.  Patient medical history of stroke (intraventricular hemorrhage) and GERD with esophagitis.  Past Medical History:  Diagnosis Date   Stroke (HCC) 07/24/2017   intraventricular hemorrhage    Past Surgical History:  Procedure Laterality Date   IR ANGIO INTRA EXTRACRAN SEL COM CAROTID INNOMINATE BILAT MOD SED  07/27/2017   IR ANGIO VERTEBRAL SEL VERTEBRAL BILAT MOD SED  07/27/2017    Family History  Problem Relation Age of Onset   Hypertension Sister     Social History   Socioeconomic History   Marital status: Widowed    Spouse name: Not on file   Number of children: Not on file   Years of education: Not on file   Highest education level: Not on file  Occupational History   Not on file  Social Needs   Financial resource strain: Not on file   Food insecurity:    Worry: Not on file    Inability: Not on file   Transportation needs:    Medical: Not on file    Non-medical: Not on file  Tobacco Use   Smoking status: Never Smoker   Smokeless tobacco: Never Used  Substance and Sexual Activity   Alcohol use: No    Frequency: Never   Drug use: No   Sexual activity: Not Currently  Lifestyle   Physical activity:    Days per week: Not on file    Minutes per session: Not on file   Stress: Not on file  Relationships   Social connections:    Talks on phone: Not on file    Gets together: Not on file    Attends religious service: Not on file    Active member of club or organization: Not on file    Attends meetings of clubs or organizations: Not on file    Relationship status: Not on file   Intimate partner violence:    Fear of current or ex partner: Not on file    Emotionally  abused: Not on file    Physically abused: Not on file    Forced sexual activity: Not on file  Other Topics Concern   Not on file  Social History Narrative   Not on file    Outpatient Medications Prior to Visit  Medication Sig Dispense Refill   polyethylene glycol powder (GLYCOLAX/MIRALAX) powder Take 17 g by mouth daily. 3350 g 1   amLODipine (NORVASC) 2.5 MG tablet Take 1 tablet (2.5 mg total) by mouth daily. 30 tablet 5   atorvastatin (LIPITOR) 40 MG tablet Take 1 tablet (40 mg total) by mouth daily at 6 PM. 30 tablet 5   hydrochlorothiazide (HYDRODIURIL) 25 MG tablet Take 1 tablet (25 mg total) by mouth daily. 30 tablet 5   topiramate (TOPAMAX) 25 MG tablet Take 1 tablet (25 mg total) by mouth 2 (two) times daily. 30 tablet 5   acetaminophen (TYLENOL) 500 MG tablet Take 1 tablet (500 mg total) by mouth every 8 (eight) hours as needed. 90 tablet 0   omeprazole (PRILOSEC) 40 MG capsule Take 1 capsule (40 mg total) by mouth daily. (Patient not taking: Reported on 09/01/2018) 30 capsule 5   No facility-administered medications  prior to visit.     No Known Allergies  ROS Review of Systems  Constitutional: Negative.   HENT: Negative.   Eyes: Positive for visual disturbance.  Cardiovascular: Negative.   Gastrointestinal: Positive for abdominal pain.  Endocrine: Negative.   Genitourinary: Negative.   Musculoskeletal: Negative.   Allergic/Immunologic: Negative.   Neurological: Negative.   Hematological: Negative.   Psychiatric/Behavioral: Negative.       Objective:    Physical Exam  Constitutional: She is oriented to person, place, and time. She appears well-developed.  HENT:  Head: Normocephalic and atraumatic.  Eyes: EOM are normal. Scleral icterus is present.    Neck: Normal range of motion.  Cardiovascular: Normal rate and regular rhythm.  Pulmonary/Chest: Effort normal and breath sounds normal.  Abdominal: Soft. Bowel sounds are normal.  Musculoskeletal:  Normal range of motion.  Neurological: She is alert and oriented to person, place, and time.  Skin: Skin is warm and dry.  Psychiatric: She has a normal mood and affect.    BP (!) 147/87 (BP Location: Right Arm, Patient Position: Sitting, Cuff Size: Normal)    Pulse 70    Temp (!) 97.5 F (36.4 C) (Oral)    Ht 4\' 9"  (1.448 m)    Wt 106 lb 9.6 oz (48.4 kg)    SpO2 99%    BMI 23.07 kg/m  Wt Readings from Last 3 Encounters:  09/01/18 106 lb 9.6 oz (48.4 kg)  03/03/18 101 lb 3.2 oz (45.9 kg)  01/13/18 98 lb 6.4 oz (44.6 kg)     Health Maintenance Due  Topic Date Due   MAMMOGRAM  06/08/2008    There are no preventive care reminders to display for this patient.  Lab Results  Component Value Date   TSH 0.656 09/16/2017   Lab Results  Component Value Date   WBC 5.8 10/12/2017   HGB 10.0 (L) 10/12/2017   HCT 33.1 (L) 10/12/2017   MCV 75 (L) 10/12/2017   PLT 211 10/12/2017   Lab Results  Component Value Date   NA 143 03/03/2018   K 3.4 (L) 03/03/2018   CO2 24 03/03/2018   GLUCOSE 93 03/03/2018   BUN 25 (H) 03/03/2018   CREATININE 1.32 (H) 03/03/2018   BILITOT 0.5 03/03/2018   ALKPHOS 112 03/03/2018   AST 22 03/03/2018   ALT 9 03/03/2018   PROT 7.9 03/03/2018   ALBUMIN 4.7 03/03/2018   CALCIUM 9.9 03/03/2018   ANIONGAP 11 07/28/2017   Lab Results  Component Value Date   CHOL 122 03/03/2018   Lab Results  Component Value Date   HDL 54 03/03/2018   Lab Results  Component Value Date   LDLCALC 47 03/03/2018   Lab Results  Component Value Date   TRIG 105 03/03/2018   Lab Results  Component Value Date   CHOLHDL 2.3 03/03/2018   Lab Results  Component Value Date   HGBA1C 5.1 07/24/2017      Assessment & Plan:   Problem List Items Addressed This Visit    None    Visit Diagnoses    Abdominal pain, chronic, epigastric    -  Primary   Relevant Medications   topiramate (TOPAMAX) 25 MG tablet   Other Relevant Orders   Comprehensive metabolic panel    Hypertension, unspecified type       Relevant Medications   amLODipine (NORVASC) 2.5 MG tablet   atorvastatin (LIPITOR) 40 MG tablet   hydrochlorothiazide (HYDRODIURIL) 25 MG tablet   Other Relevant Orders  CBC with Differential   Comprehensive metabolic panel   Medication refill       Relevant Medications   omeprazole (PRILOSEC) 40 MG capsule   topiramate (TOPAMAX) 25 MG tablet   Elevated BUN       Relevant Orders   Comprehensive metabolic panel   Elevated lipids       Relevant Orders   Lipid Panel   Gastroesophageal reflux disease with esophagitis        Adriyanna was seen today for follow-up.  Diagnoses and all orders for this visit:  Abdominal pain, chronic, epigastric -     Comprehensive metabolic panel  Hypertension, unspecified type Slightly elevated at this visit she did not take medication she was "fasting" -     amLODipine (NORVASC) 2.5 MG tablet; Take 1 tablet (2.5 mg total) by mouth daily. -     atorvastatin (LIPITOR) 40 MG tablet; Take 1 tablet (40 mg total) by mouth daily at 6 PM. -     hydrochlorothiazide (HYDRODIURIL) 25 MG tablet; Take 1 tablet (25 mg total) by mouth daily. -     CBC with Differential -     Comprehensive metabolic panel  Medication refill -     omeprazole (PRILOSEC) 40 MG capsule; Take 1 capsule (40 mg total) by mouth daily. -     topiramate (TOPAMAX) 25 MG tablet; Take 1 tablet (25 mg total) by mouth 2 (two) times daily.  Elevated BUN -     Comprehensive metabolic panel  Elevated lipids Stop taking statin made her feel sick. Ck levels and if indicated try Omega 3 fatty Acids -     Lipid Panel  Gastroesophageal reflux disease with esophagitis Refilled PPI cautioned this is not a every day medication prn note to cause gastric carcinoid tumors.    No orders of the defined types were placed in this encounter.   Follow-up: Return in about 3 months (around 12/02/2018) for HTN, Lipids.    Grayce Sessions, NP

## 2018-09-02 LAB — CBC WITH DIFFERENTIAL/PLATELET
BASOS ABS: 0 10*3/uL (ref 0.0–0.2)
BASOS: 1 %
EOS (ABSOLUTE): 0.8 10*3/uL — AB (ref 0.0–0.4)
Eos: 13 %
HEMOGLOBIN: 10.7 g/dL — AB (ref 11.1–15.9)
Hematocrit: 34.4 % (ref 34.0–46.6)
IMMATURE GRANS (ABS): 0 10*3/uL (ref 0.0–0.1)
Immature Granulocytes: 0 %
LYMPHS ABS: 2.3 10*3/uL (ref 0.7–3.1)
Lymphs: 41 %
MCH: 22.7 pg — AB (ref 26.6–33.0)
MCHC: 31.1 g/dL — AB (ref 31.5–35.7)
MCV: 73 fL — AB (ref 79–97)
Monocytes Absolute: 0.5 10*3/uL (ref 0.1–0.9)
Monocytes: 8 %
NEUTROS ABS: 2.1 10*3/uL (ref 1.4–7.0)
Neutrophils: 37 %
Platelets: 216 10*3/uL (ref 150–450)
RBC: 4.72 x10E6/uL (ref 3.77–5.28)
RDW: 15.9 % — ABNORMAL HIGH (ref 11.7–15.4)
WBC: 5.7 10*3/uL (ref 3.4–10.8)

## 2018-09-02 LAB — COMPREHENSIVE METABOLIC PANEL
A/G RATIO: 1.5 (ref 1.2–2.2)
ALBUMIN: 4.8 g/dL (ref 3.8–4.9)
ALK PHOS: 109 IU/L (ref 39–117)
ALT: 15 IU/L (ref 0–32)
AST: 23 IU/L (ref 0–40)
BILIRUBIN TOTAL: 0.5 mg/dL (ref 0.0–1.2)
BUN / CREAT RATIO: 20 (ref 12–28)
BUN: 27 mg/dL (ref 8–27)
CHLORIDE: 105 mmol/L (ref 96–106)
CO2: 22 mmol/L (ref 20–29)
Calcium: 10.1 mg/dL (ref 8.7–10.3)
Creatinine, Ser: 1.36 mg/dL — ABNORMAL HIGH (ref 0.57–1.00)
GFR calc non Af Amer: 42 mL/min/{1.73_m2} — ABNORMAL LOW (ref >59)
GFR, EST AFRICAN AMERICAN: 49 mL/min/{1.73_m2} — AB (ref >59)
GLUCOSE: 92 mg/dL (ref 65–99)
Globulin, Total: 3.2 g/dL (ref 1.5–4.5)
POTASSIUM: 4.4 mmol/L (ref 3.5–5.2)
SODIUM: 143 mmol/L (ref 134–144)
TOTAL PROTEIN: 8 g/dL (ref 6.0–8.5)

## 2018-09-02 LAB — LIPID PANEL
CHOLESTEROL TOTAL: 210 mg/dL — AB (ref 100–199)
Chol/HDL Ratio: 3.6 ratio (ref 0.0–4.4)
HDL: 58 mg/dL (ref >39)
LDL Calculated: 125 mg/dL — ABNORMAL HIGH (ref 0–99)
Triglycerides: 137 mg/dL (ref 0–149)
VLDL Cholesterol Cal: 27 mg/dL (ref 5–40)

## 2018-09-13 MED FILL — ?TOPIRAMATE 25MG TABLET: 25 | 90 days supply | Qty: 90 | Fill #0

## 2018-09-13 MED FILL — HYDROCHLOROTHIAZIDE 25 MG T: 25 | 30 days supply | Qty: 30 | Fill #0

## 2018-09-13 MED FILL — OMEPRAZOLE DR 40 MG CAPSULE: 40 | 30 days supply | Qty: 30 | Fill #0

## 2018-09-13 MED FILL — AMLODIPINE 2.5 MG TABLET: 2.5 | 30 days supply | Qty: 30 | Fill #0

## 2018-09-13 MED FILL — ATORVASTATIN CALCIUM 40 MG: 40 | 30 days supply | Qty: 30 | Fill #0

## 2018-10-14 ENCOUNTER — Encounter (INDEPENDENT_AMBULATORY_CARE_PROVIDER_SITE_OTHER): Payer: Self-pay | Admitting: *Deleted

## 2018-10-19 MED FILL — ATORVASTATIN CALCIUM 40 MG: 40 | 30 days supply | Qty: 30 | Fill #1

## 2018-10-19 MED FILL — AMLODIPINE 2.5 MG TABLET: 2.5 | 30 days supply | Qty: 30 | Fill #1

## 2018-10-19 MED FILL — HYDROCHLOROTHIAZIDE 25 MG T: 25 | 30 days supply | Qty: 30 | Fill #1

## 2018-10-24 IMAGING — MR MR HEAD WO/W CM
12 of 20 series · 27 of 48 positions shown · IV contrast (10 Multihance)
Comparison: Head CT and CTA 07/24/2017

CLINICAL DATA: Headache, neck pain, and generalized body aches for
3 days. Hypertensive. Intraventricular hemorrhage in the right
lateral ventricle on CT.

EXAM:
MRI HEAD WITHOUT AND WITH CONTRAST
MRI CERVICAL SPINE WITHOUT AND WITH CONTRAST
TECHNIQUE: Multiplanar, multiecho pulse sequences of the brain and surrounding
structures, and cervical spine, to include the craniocervical
junction and cervicothoracic junction, were obtained without and
with intravenous contrast.
CONTRAST:  10mL MULTIHANCE GADOBENATE DIMEGLUMINE 529 MG/ML IV SOLN

[Series 3: DWI · axial · 3.0mm · 0.94mm/px · z∈[-85,+57]mm · 6 of 100 slices shown (1 of 2)]
[im 1/100]
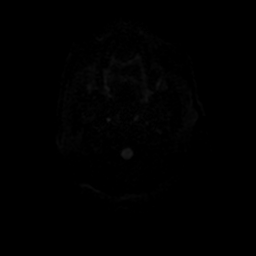
[im 20/100]
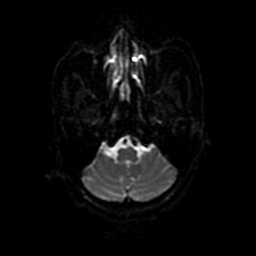
[im 40/100]
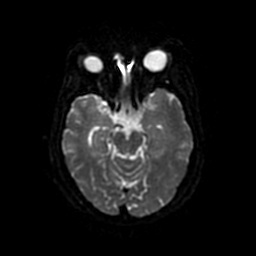
[im 60/100]
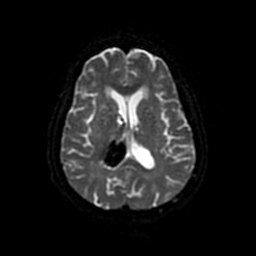
[im 80/100]
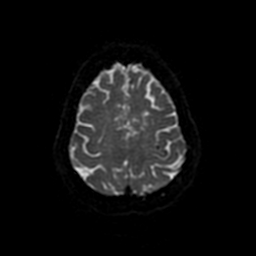
[im 100/100]
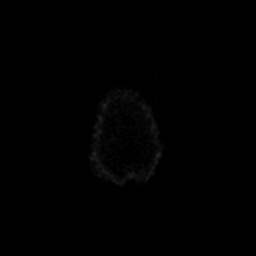

[Series 4: DWI · coronal · 4.0mm · 0.94mm/px · 4 of 68 slices shown (2 of 2)]
[im 1/68]
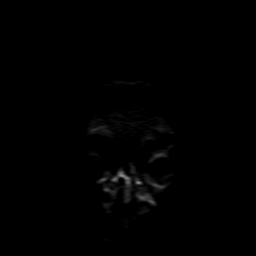
[im 23/68]
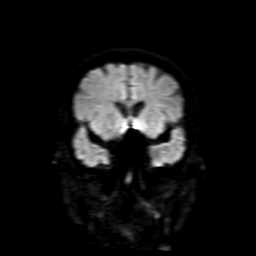
[im 45/68]
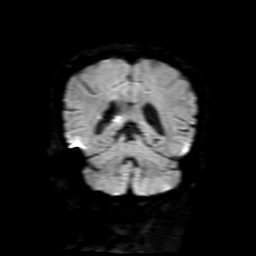
[im 68/68]
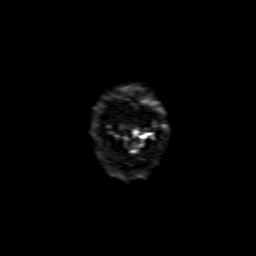

[Series 5: FLAIR · sagittal · 5.0mm · 0.47mm/px · 1 of 23 slices shown (1 of 3)]
[im 1/23]
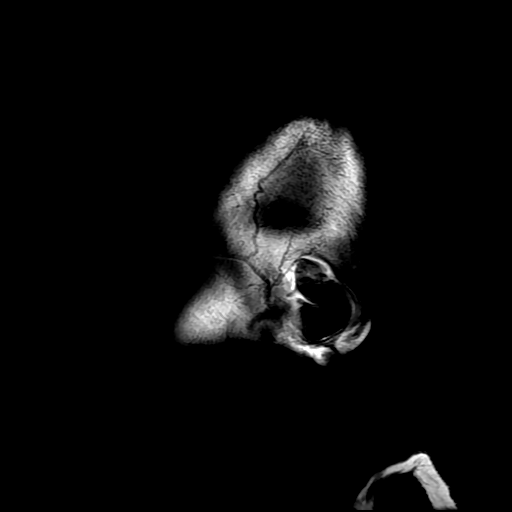

[Series 7: T2 · axial · 5.0mm · 0.47mm/px · z∈[-114,+47]mm · 2 of 29 slices shown (1 of 4)]
[im 1/29]
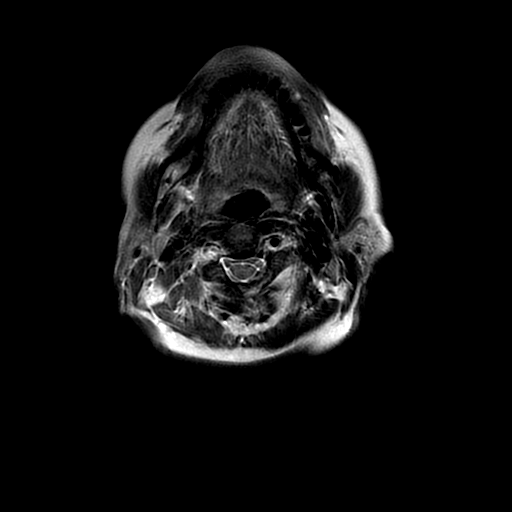
[im 29/29]
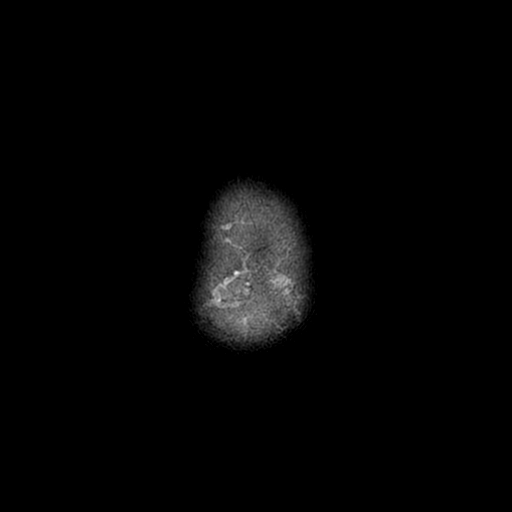

[Series 8: FLAIR · axial · 3.0mm · 0.41mm/px · z∈[-108,+53]mm · 2 of 29 slices shown (2 of 3)]
[im 1/29]
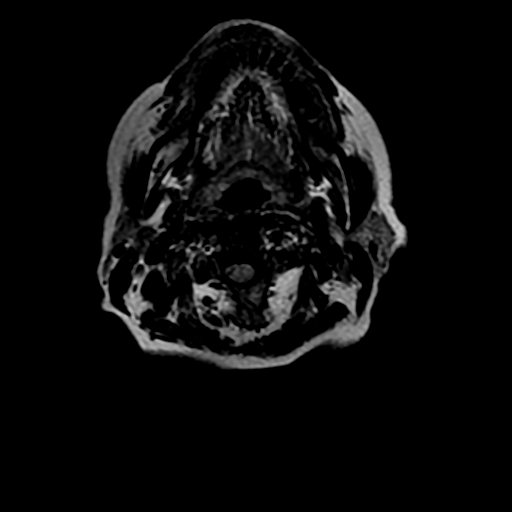
[im 29/29]
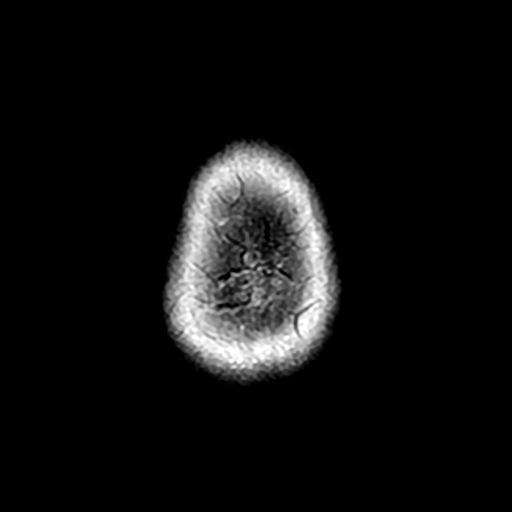

[Series 11: T2 · coronal · 5.0mm · 0.39mm/px · 2 of 27 slices shown (2 of 4)]
[im 1/27]
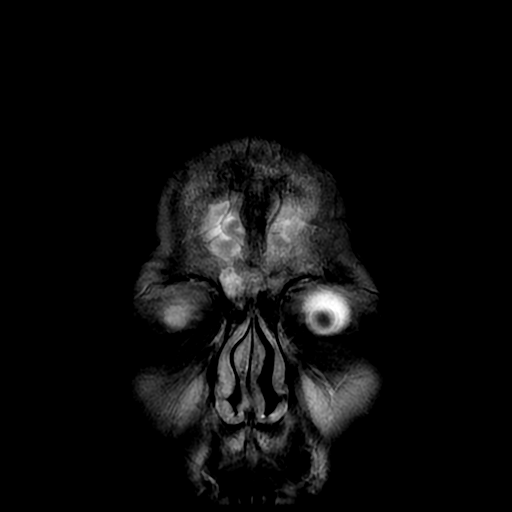
[im 27/27]
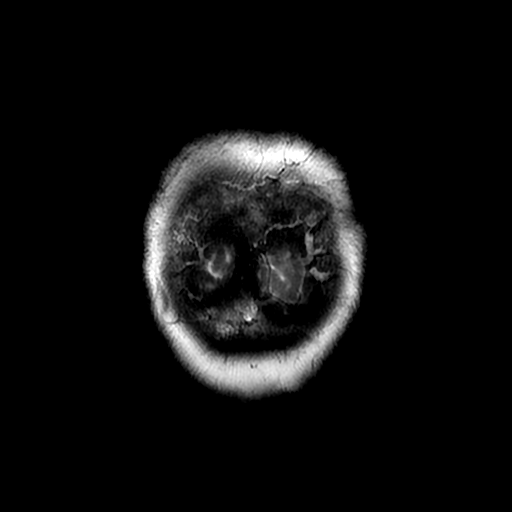

[Series 13: T2 · sagittal · 3.0mm · 0.39mm/px · 1 of 16 slices shown (3 of 4)]
[im 1/16]
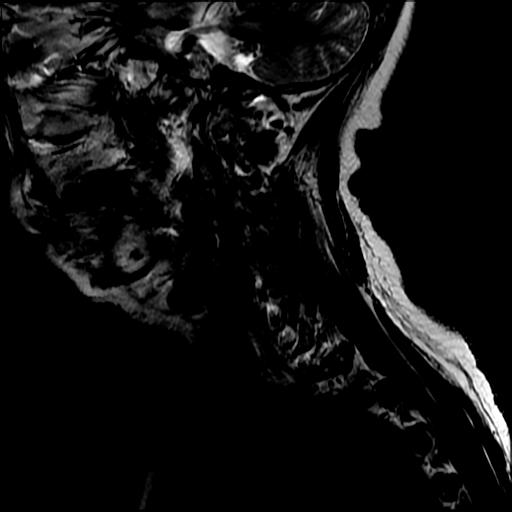

[Series 14: FLAIR · sagittal · 3.0mm · 0.39mm/px · 1 of 16 slices shown (3 of 3)]
[im 1/16]
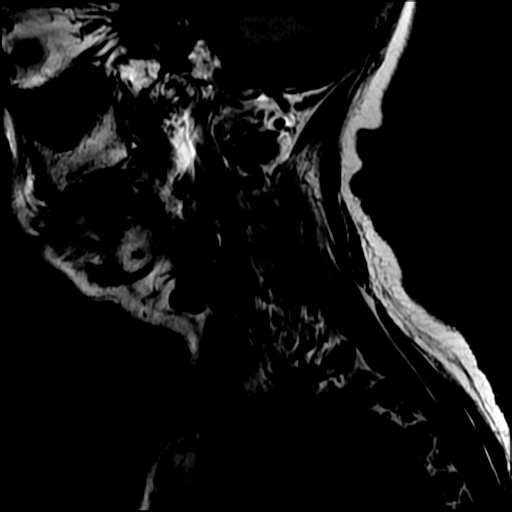

[Series 17: T2 · axial · 3.0mm · 0.35mm/px · 1 of 31 slices shown (4 of 4)]
[im 1/31]
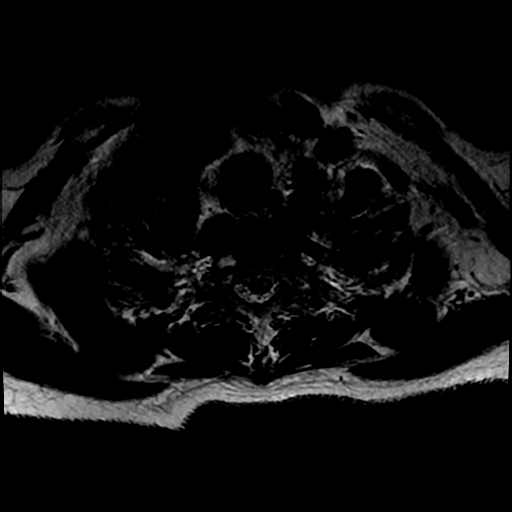

[Series 22: T1 post-contrast · axial · 3.0mm · 0.35mm/px · z∈[-177,-79]mm · 2 of 31 slices shown]
[im 1/31]
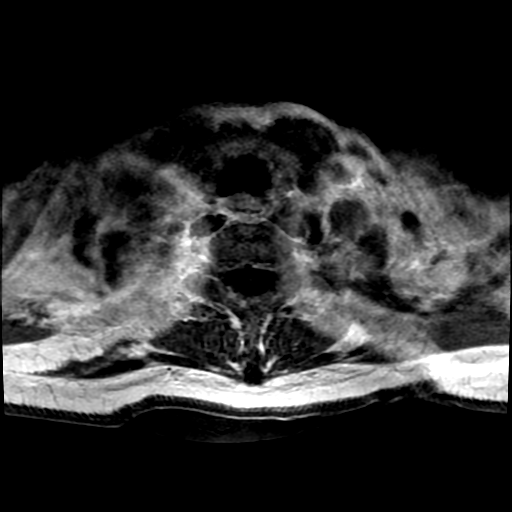
[im 31/31]
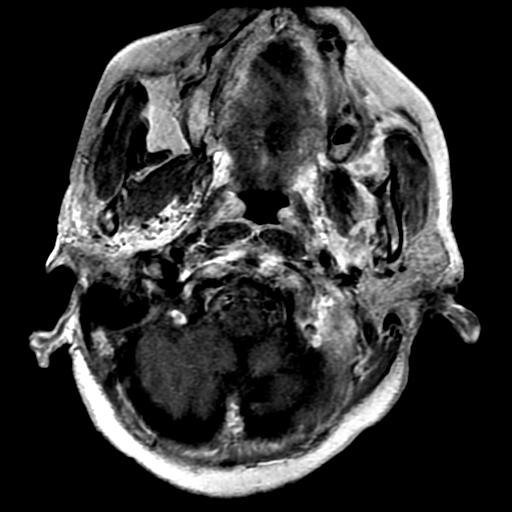

[Series 350: ADC · axial · 3.0mm · 0.94mm/px · z∈[-85,+57]mm · 3 of 50 slices shown (1 of 2)]
[im 1/50]
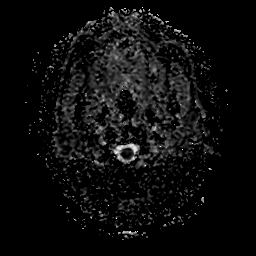
[im 25/50]
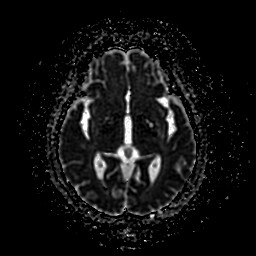
[im 50/50]
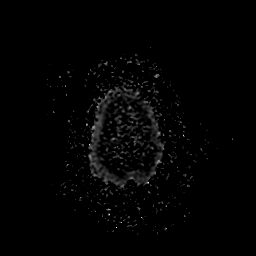

[Series 450: ADC · coronal · 4.0mm · 0.94mm/px · 2 of 34 slices shown (2 of 2)]
[im 1/34]
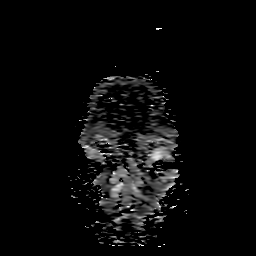
[im 34/34]
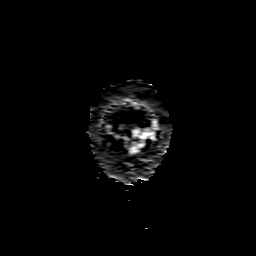

[27 of 48 positions shown; findings below may reference images not displayed]

FINDINGS: MRI HEAD FINDINGS

Brain: Hemorrhage in the body and atrium of the right lateral
ventricle has not significantly changed in size from the earlier CT
allowing for differences in modality. This measures approximately
4.4 x 1.4 x 2.0 cm on MRI (calculated volume of 6.2 mL). There is
minimal surrounding artifact on diffusion-weighted imaging without
evidence of an acute parenchymal infarct. A small amount of blood
products are also now evident in the occipital horns of both lateral
ventricle as well as well as in the fourth ventricle. The ventricles
are unchanged in size, and there is no evidence of hydrocephalus. No
underlying enhancing mass or definite vascular malformation is
identified.

There is mild cerebral atrophy. Chronic microhemorrhages are present
in the cerebellum, brainstem, bilateral basal ganglia, left
thalamus, and both cerebral hemispheres. Preferential involvement of
the deep gray nuclei and brainstem favors hypertension as the
underlying etiology of the chronic hemorrhages. There are chronic
lacunar infarcts in the deep cerebral white matter bilaterally, left
thalamus, and pons. Patchy T2 hyperintensities throughout the
subcortical and deep cerebral white matter and brainstem are
nonspecific but compatible with chronic small vessel ischemia,
greatly advanced for age. There is no intracranial mass/mass effect
or extra-axial fluid collection.

Vascular: Major intracranial vascular flow voids are preserved.

Skull and upper cervical spine: Unremarkable bone marrow signal.

Sinuses/Orbits: Left buphthalmos. Mild paranasal sinus mucosal
thickening. Small bilateral maxillary sinuses. Clear mastoid air
cells.

Other: None.

MRI CERVICAL SPINE FINDINGS

The study is moderately motion degraded.

Alignment: Normal.

Vertebrae: No fracture, suspicious osseous lesion, or significant
marrow edema.

Cord: Normal signal and morphology.

Posterior Fossa, vertebral arteries, paraspinal tissues: Grossly
preserved vertebral artery flow voids. No gross paraspinal soft
tissue abnormality.

Disc levels:

C2-3 through C4-5: No significant findings identified within
limitations of motion artifact.

C5-6: Mild disc space narrowing. Disc bulging/right paracentral disc
protrusion result in mild-to-moderate spinal stenosis with slight
flattening of the ventral spinal cord. No significant neural
foraminal stenosis.

C6-7: Tiny left central disc protrusion without stenosis or spinal
cord mass effect.

C7-T1: Negative.
IMPRESSION: 1. Unchanged right lateral intraventricular hemorrhage. Small volume
blood products in the left lateral and fourth ventricles. No
hydrocephalus.
2. No acute infarct or mass identified.
3. Markedly age advanced chronic small vessel ischemic disease with
multiple chronic lacunar infarcts as above.
4. Numerous chronic microhemorrhages favoring chronic hypertension.
5. Motion degraded cervical spine MRI. C5-6 disc degeneration with
mild-to-moderate spinal stenosis.
6. Tiny C6-7 disc protrusion without stenosis.

## 2018-10-27 IMAGING — XA IR ANGIO VETEBRAL SEL VERTEBRAL BILAT MOD SED
1 series · 11 of 24 positions shown · IV contrast (IODINE)
Comparison: CT of the brain and CTA of the brain of 07/24/2017.

CLINICAL DATA: Headaches.  Intraventricular hemorrhage.

EXAM:
BILATERAL COMMON CAROTID AND INNOMINATE ANGIOGRAPHY; IR ANGIO
VERTEBRAL SEL VERTEBRAL BILAT MOD SED
TECHNIQUE: Informed written consent was obtained from the patient and her
daughter via an interpreter. After a thorough discussion of the
procedural risks, benefits and alternatives. All questions were
addressed. Maximal Sterile Barrier Technique was utilized including
caps, mask, sterile gowns, sterile gloves, sterile drape, hand
hygiene and skin antiseptic. A timeout was performed prior to the
initiation of the procedure.

[Series 300: dr. (person_name) · 11 of 229 slices shown]
[im 10/229]
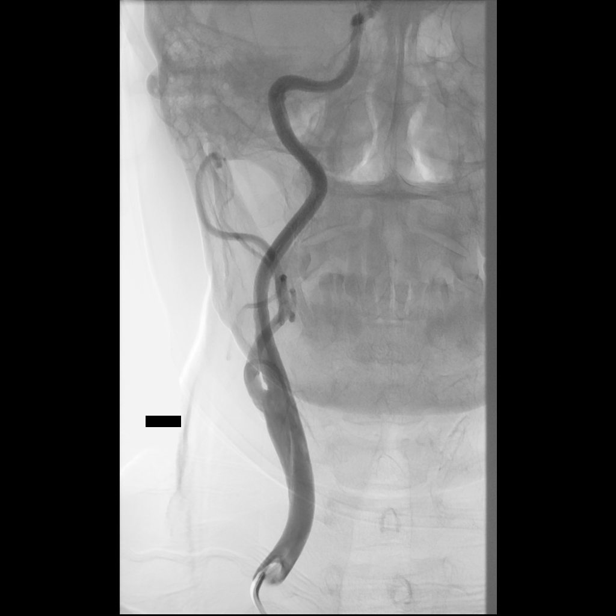
[im 30/229]
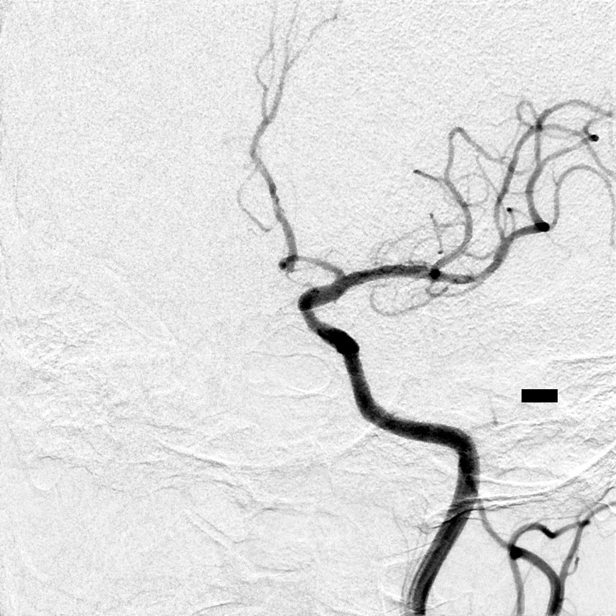
[im 50/229]
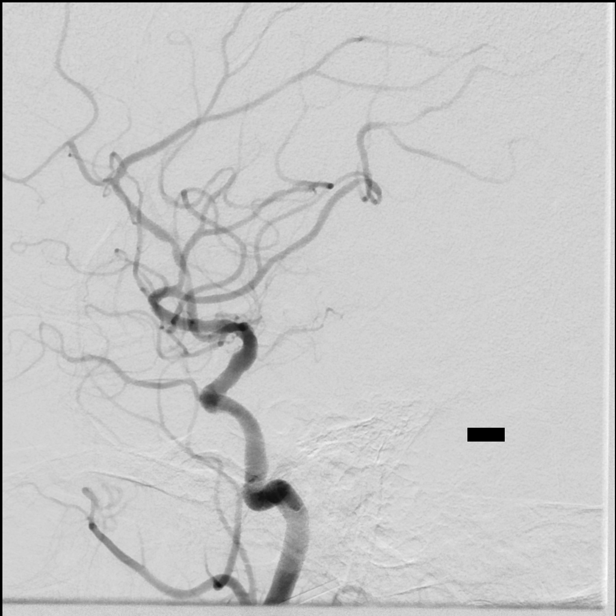
[im 70/229]
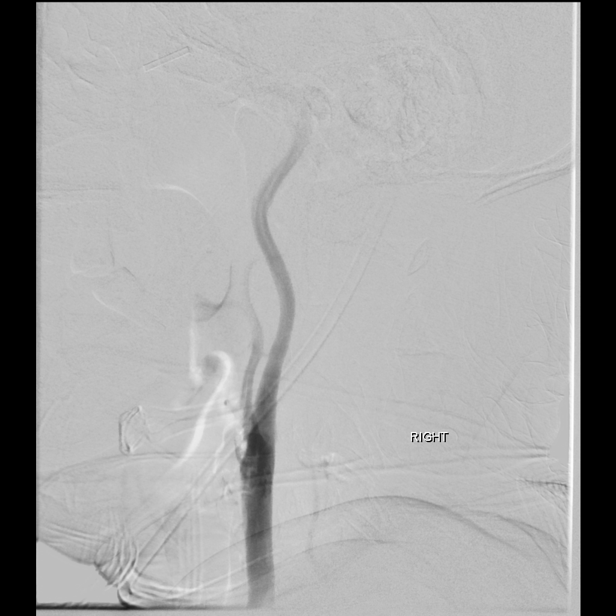
[im 90/229]
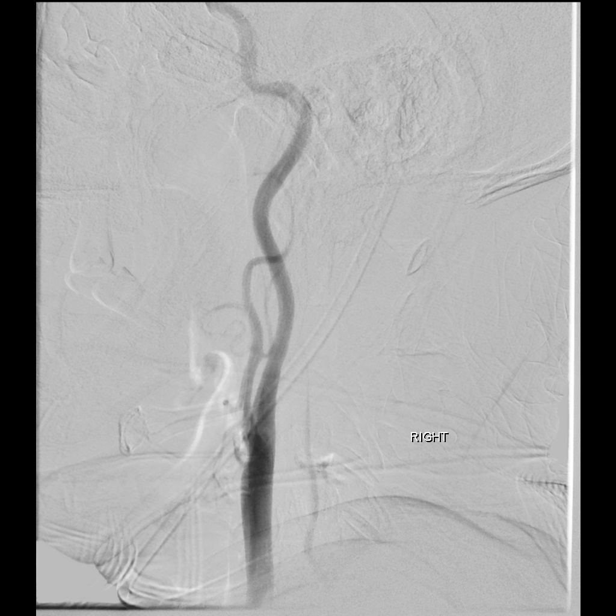
[im 119/229]
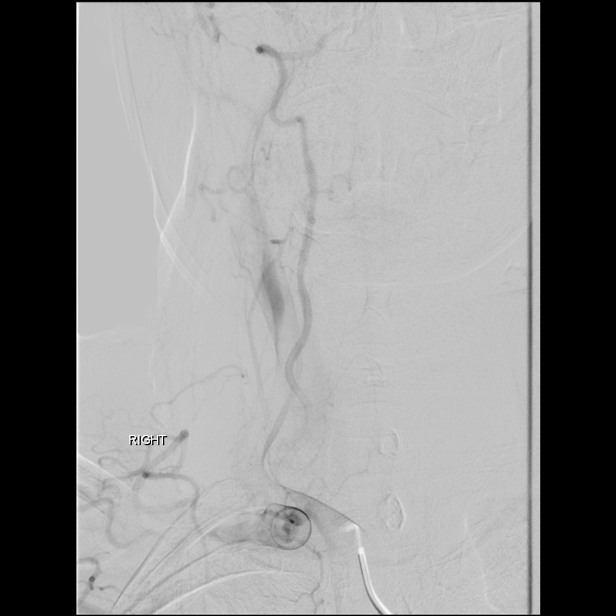
[im 139/229]
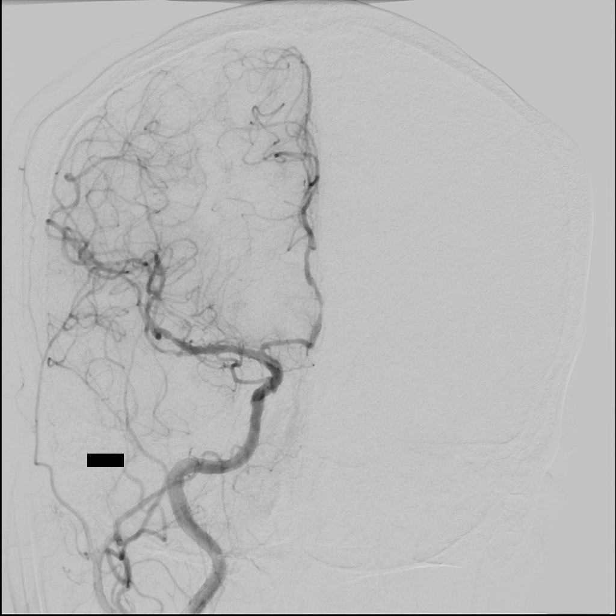
[im 159/229]
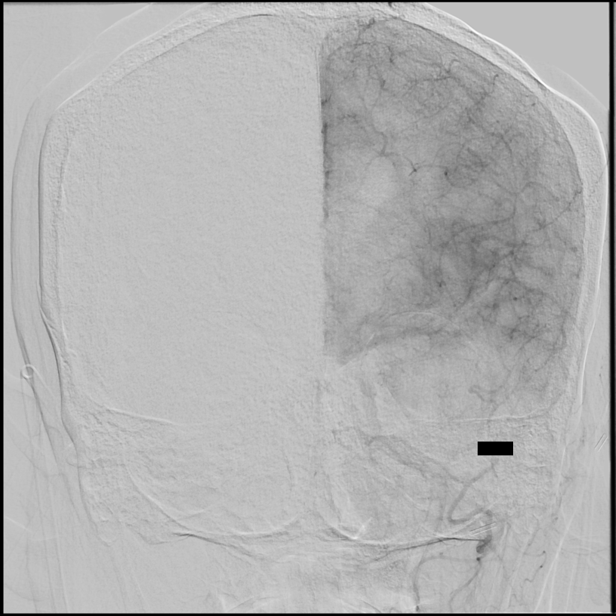
[im 179/229]
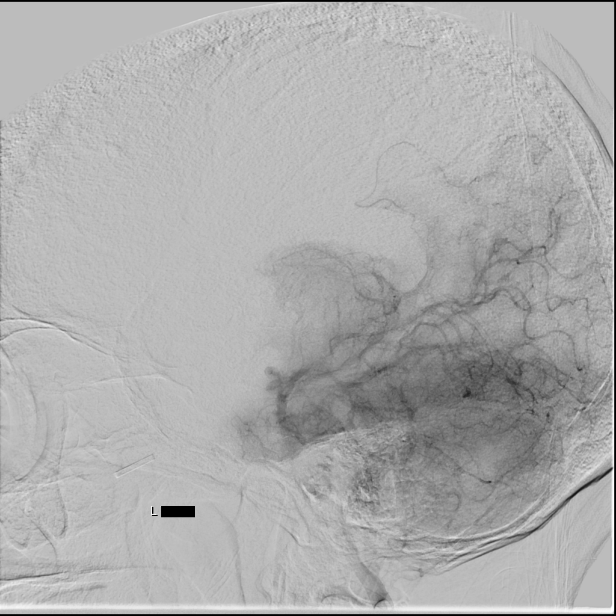
[im 199/229]
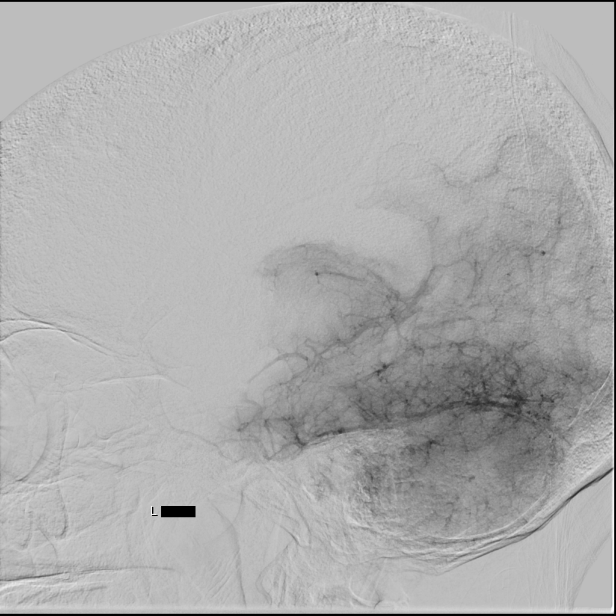
[im 219/229]
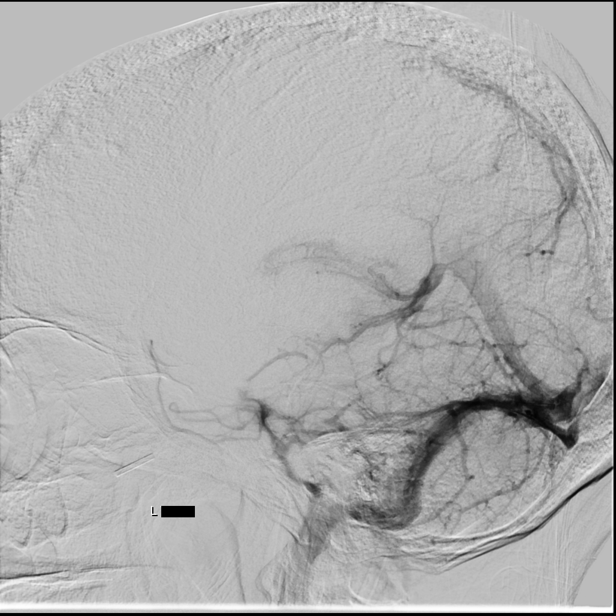

[11 of 24 positions shown; findings below may reference images not displayed]

MEDICATIONS:
Heparin 8111 units IV; no antibiotic was administered within 1 hour
of the procedure.

ANESTHESIA/SEDATION:
Versed 1 mg IV; Fentanyl 25 mcg IV.

Moderate Sedation Time:  25 minutes.

The patient was continuously monitored during the procedure by the
interventional radiology nurse under my direct supervision.

CONTRAST:  Omnipaque 300 approximately 60 cc.

FLUOROSCOPY TIME:  Fluoroscopy Time: 6 minutes 18 seconds (491 mGy).

COMPLICATIONS:
None immediate.
The right groin was prepped and draped in the usual sterile fashion.
Thereafter using modified Seldinger technique, transfemoral access
into the right common femoral artery was obtained without
difficulty. Over a 0.035 inch guidewire, a 5 French Pinnacle sheath
was inserted. Through this, and also over 0.035 inch guidewire, a 5
French JB 1 catheter was advanced to the aortic arch region and
selectively positioned in the right common carotid artery,, the
right vertebral artery, the left common carotid artery and the left
vertebral artery.
FINDINGS: The right common carotid arteriogram demonstrates mild
circumferential narrowing of the distal right common carotid artery
proximal to the bifurcation secondary to a smooth anterior in
position plaque.

The right external carotid artery and its major branches are normal.
The right internal carotid artery at the bulb to the cranial skull
base opacifies normally.

There is mild stenosis of the proximal cavernous segment of the
right internal carotid artery.

Distal to this the supraclinoid segment is widely patent.

The right middle cerebral artery also demonstrates mild stenosis of
the right M1 segment. The distal MCA branches and the right anterior
cerebral artery opacify into the capillary and venous phases.

Focal areas of caliber irregularity are noted involving the right A2
segment of the anterior cerebral artery, and also the distal M3
branches of the inferior division of the right middle cerebral
artery.

A hypoplastic right vertebral artery origin is widely patent.

The vessel is seen to opacify to the cranial skull base. Two focal
areas of approximately 70% stenosis are seen on either side of the
origin of the right posterior-inferior cerebellar artery.

Distal to this, the right vertebrobasilar junction is patent though
with a focal area of significant stenosis in the distal right
vertebrobasilar junction.

The dominant left vertebral artery origin is normal.

The vessel is seen to opacify normally to the cranial skull base.

There is approximately 50% stenosis of the left vertebral artery at
the level of C2.

Distal to this the vertebral artery is seen to opacify to the
vertebrobasilar junction. There is approximately 50% stenosis just
proximal and distal to the origin of the left posterior-inferior
cerebellar artery.

The basilar artery, the posterior cerebral arteries, the superior
cerebellar arteries and the anterior-inferior cerebellar arteries
opacify into the capillary and venous phases.

The venous phase demonstrates a severe stenosis of the distal left
transverse sinus proximal to the entry of the vein of Labbe.

There is approximately 70% stenosis of the left PCA M1 segment, with
focal areas of caliber irregularity involving the P2 P3 segments.

Retrograde opacification of a stump of a distal right vertebral
artery is noted secondary to proximal occlusion. Also demonstrated
is a fenestration of the proximal [DATE] of the basilar artery a
development variation.

The left common carotid arteriogram demonstrates the left external
carotid artery and its major branches to be widely patent.

The left internal carotid artery at the bulb to the cranial skull
base opacifies normally.

Patency of the petrous, the cavernous and the supraclinoid segments
is seen. There is a 50% stenosis of the caval cavernous segment of
the left internal carotid artery at the site of the origin of the
ophthalmic artery.

The left middle cerebral artery and the left anterior cerebral
artery opacify into the capillary and venous phases. Again
demonstrated are focal areas of stenosis involving the left
pericallosal and also the M2 M3 region of the inferior division of
the left middle cerebral artery.

The venous phase demonstrates again attenuation of caliber of the
left transverse sinus.
IMPRESSION: Angiographically no evidence of arteriovenous shunting, or of dural
AV fistula, or of arteriovenous malformation or aneurysm
intracranially.

Focal areas of caliber irregularity involving the middle cerebral
arteries and the anterior cerebral arteries bilaterally as above.
These are indicative probably of intracranial arteriosclerosis.

50% stenosis of the left internal carotid artery caval cavernous
segment, and to a lesser degree of the right proximal cavernous
segment.

High-grade 70% stenosis of the right vertebrobasilar junction distal
and proximal to the origin of the right posterior-inferior
cerebellar artery, and also of the dominant left vertebrobasilar
junction of approximately 50% distal to the left posterior-inferior
cerebellar artery.

Approximately 70% stenosis of the left posterior cerebral artery of
the P1 segment, and also of a moderate degree involving the P2 P3
segments of the posterior cerebral arteries.

Probable high-grade stenosis of the distal left transverse sinus
proximal to the entry of the left vein of Labbe. Less likely
possibility of a prominent arachnoid granulation.

PLAN:
As per primary referring physician.

## 2018-12-02 ENCOUNTER — Ambulatory Visit: Payer: Self-pay | Admitting: Primary Care

## 2018-12-02 ENCOUNTER — Ambulatory Visit (INDEPENDENT_AMBULATORY_CARE_PROVIDER_SITE_OTHER): Payer: Self-pay | Admitting: Primary Care

## 2018-12-02 ENCOUNTER — Other Ambulatory Visit: Payer: Self-pay

## 2018-12-02 ENCOUNTER — Encounter: Payer: Self-pay | Admitting: Primary Care

## 2018-12-02 ENCOUNTER — Ambulatory Visit: Payer: Self-pay | Attending: Primary Care | Admitting: Primary Care

## 2018-12-02 VITALS — BP 170/83 | HR 70 | Temp 98.2°F | Ht <= 58 in | Wt 109.2 lb

## 2018-12-02 DIAGNOSIS — K219 Gastro-esophageal reflux disease without esophagitis: Secondary | ICD-10-CM

## 2018-12-02 DIAGNOSIS — E782 Mixed hyperlipidemia: Secondary | ICD-10-CM

## 2018-12-02 DIAGNOSIS — I10 Essential (primary) hypertension: Secondary | ICD-10-CM

## 2018-12-02 DIAGNOSIS — J301 Allergic rhinitis due to pollen: Secondary | ICD-10-CM

## 2018-12-02 DIAGNOSIS — R300 Dysuria: Secondary | ICD-10-CM

## 2018-12-02 DIAGNOSIS — Z760 Encounter for issue of repeat prescription: Secondary | ICD-10-CM

## 2018-12-02 DIAGNOSIS — I169 Hypertensive crisis, unspecified: Secondary | ICD-10-CM | POA: Insufficient documentation

## 2018-12-02 DIAGNOSIS — D508 Other iron deficiency anemias: Secondary | ICD-10-CM

## 2018-12-02 DIAGNOSIS — Z131 Encounter for screening for diabetes mellitus: Secondary | ICD-10-CM

## 2018-12-02 LAB — POCT URINALYSIS DIP (CLINITEK)
Bilirubin, UA: NEGATIVE
Blood, UA: NEGATIVE
Glucose, UA: NEGATIVE mg/dL
Ketones, POC UA: NEGATIVE mg/dL
Leukocytes, UA: NEGATIVE
Nitrite, UA: NEGATIVE
POC PROTEIN,UA: NEGATIVE
Spec Grav, UA: 1.01 (ref 1.010–1.025)
Urobilinogen, UA: 0.2 E.U./dL
pH, UA: 5.5 (ref 5.0–8.0)

## 2018-12-02 LAB — POCT GLYCOSYLATED HEMOGLOBIN (HGB A1C): Hemoglobin A1C: 5.1 % (ref 4.0–5.6)

## 2018-12-02 MED ORDER — AMLODIPINE BESYLATE 5 MG PO TABS: 2.5000 mg | ORAL_TABLET | Freq: Every day | ORAL | 3 refills | Status: DC

## 2018-12-02 MED ORDER — ATORVASTATIN CALCIUM 40 MG PO TABS: 40.0000 mg | ORAL_TABLET | Freq: Every day | ORAL | 3 refills | Status: DC

## 2018-12-02 MED ORDER — TOPIRAMATE 25 MG PO TABS: 25.0000 mg | ORAL_TABLET | Freq: Two times a day (BID) | ORAL | 3 refills | Status: DC

## 2018-12-02 MED ORDER — HYDROCHLOROTHIAZIDE 25 MG PO TABS: 25.0000 mg | ORAL_TABLET | Freq: Every day | ORAL | 3 refills | Status: DC

## 2018-12-02 MED ORDER — LORATADINE 10 MG PO TABS: 10.0000 mg | ORAL_TABLET | Freq: Every day | ORAL | 11 refills | Status: DC

## 2018-12-02 MED ORDER — SENNOSIDES-DOCUSATE SODIUM 8.6-50 MG PO TABS: 1.0000 | ORAL_TABLET | Freq: Every day | ORAL | 3 refills | Status: DC

## 2018-12-02 MED FILL — ?TOPIRAMATE 25MG TABLET: 25 | 15 days supply | Qty: 30 | Fill #0

## 2018-12-02 MED FILL — ?AMLODIPINE BESYLATE 5MG TA: 5 | 30 days supply | Qty: 30 | Fill #0

## 2018-12-02 MED FILL — ?HYDROCHLOROTHIAZIDE 25MG T: 25 | 30 days supply | Qty: 30 | Fill #0

## 2018-12-02 MED FILL — ?ATORVASTATIN 40MG TABLET: 40 | 30 days supply | Qty: 30 | Fill #0

## 2018-12-02 NOTE — Patient Instructions (Signed)

## 2018-12-02 NOTE — Progress Notes (Signed)
Pt complains that when she has the urge to urinate or have a bowel movement it hurts. Pt complains that the omeprazole is not working for her Pt complains of headaches on both sides of the head for the last 3 days. Pt Bp is elevated. She has not picked up her refill of antihypertensives.

## 2018-12-02 NOTE — Progress Notes (Signed)
Established Patient Office Visit  Subjective:  Patient ID: Diane Berry, female    DOB: 07/14/1960  Age: 58 y.o. MRN: [id number]  CC:  Chief Complaint  Patient presents with  . Follow-up    HTN/lipids    HPI [facility] D[facility] Eastern[facility] presents for concerns elevated blood pressure she has been out of medication 2 days ago, abdominal pain , hard stool, and increase urination with pain. She has her daughter present [place]  Erlene SentersRah Berry she speaks Psychiatric nurseMontagnard Jaral a interpretor was present but patient was unable to understand dialectic.PMH: CVA 2019, hypertension, GERD, and constipation. Denies shortness of breath, headaches, chest pain or lower extremity edema Past Medical History:  Diagnosis Date  . Stroke (HCC) 07/24/2017   intraventricular hemorrhage    Past Surgical History:  Procedure Laterality Date  . IR ANGIO INTRA EXTRACRAN SEL COM CAROTID INNOMINATE BILAT MOD SED  07/27/2017  . IR ANGIO VERTEBRAL SEL VERTEBRAL BILAT MOD SED  07/27/2017    Family History  Problem Relation Age of Onset  . Hypertension Sister     Social History   Socioeconomic History  . Marital status: Widowed    Spouse name: Not on file  . Number of children: Not on file  . Years of education: Not on file  . Highest education level: Not on file  Occupational History  . Not on file  Social Needs  . Financial resource strain: Not on file  . Food insecurity    Worry: Not on file    Inability: Not on file  . Transportation needs    Medical: Not on file    Non-medical: Not on file  Tobacco Use  . Smoking status: Never Smoker  . Smokeless tobacco: Never Used  Substance and Sexual Activity  . Alcohol use: No    Frequency: Never  . Drug use: No  . Sexual activity: Not Currently  Lifestyle  . Physical activity    Days per week: Not on file    Minutes per session: Not on file  . Stress: Not on file  Relationships  . Social Musicianconnections    Talks on phone: Not on file    Gets together: Not on file    Attends  religious service: Not on file    Active member of club or organization: Not on file    Attends meetings of clubs or organizations: Not on file    Relationship status: Not on file  . Intimate partner violence    Fear of current or ex partner: Not on file    Emotionally abused: Not on file    Physically abused: Not on file    Forced sexual activity: Not on file  Other Topics Concern  . Not on file  Social History Narrative  . Not on file    Outpatient Medications Prior to Visit  Medication Sig Dispense Refill  . acetaminophen (TYLENOL) 500 MG tablet Take 1 tablet (500 mg total) by mouth every 8 (eight) hours as needed. 90 tablet 0  . amLODipine (NORVASC) 2.5 MG tablet Take 1 tablet (2.5 mg total) by mouth daily. 30 tablet 5  . atorvastatin (LIPITOR) 40 MG tablet Take 1 tablet (40 mg total) by mouth daily at 6 PM. 30 tablet 5  . hydrochlorothiazide (HYDRODIURIL) 25 MG tablet Take 1 tablet (25 mg total) by mouth daily. 30 tablet 5  . omeprazole (PRILOSEC) 40 MG capsule Take 1 capsule (40 mg total) by mouth daily. 30 capsule 2  . polyethylene glycol powder (  GLYCOLAX/MIRALAX) powder Take 17 g by mouth daily. 3350 g 1  . topiramate (TOPAMAX) 25 MG tablet Take 1 tablet (25 mg total) by mouth 2 (two) times daily. 30 tablet 5   No facility-administered medications prior to visit.     No Known Allergies  ROS Review of Systems  Constitutional: Negative.   HENT: Negative.   Eyes: Positive for itching.  Respiratory: Positive for shortness of breath.   Cardiovascular: Negative.   Gastrointestinal: Positive for abdominal pain and constipation.  Endocrine: Negative.   Genitourinary: Positive for dysuria and frequency.  Musculoskeletal: Negative.   Skin: Negative.   Neurological: Negative.   Hematological: Negative.       Objective:    Physical Exam  Constitutional: She is oriented to person, place, and time. She appears well-developed.  HENT:  Head: Normocephalic.  Neck: Normal  range of motion.  Cardiovascular: Normal rate.  Pulmonary/Chest: Breath sounds normal.  Abdominal: Bowel sounds are normal. There is abdominal tenderness.  Musculoskeletal: Normal range of motion.  Neurological: She is oriented to person, place, and time.  Skin: Skin is warm.  Psychiatric: She has a normal mood and affect.     BP (!) 170/83 (BP Location: Right Arm, Patient Position: Sitting, Cuff Size: Normal)   Pulse 70   Temp 98.2 F (36.8 C) (Oral)   Ht 4\' 9"  (1.448 m)   Wt 109 lb 3.2 oz (49.5 kg)   SpO2 98%   BMI 23.63 kg/m  Wt Readings from Last 3 Encounters:  12/02/18 109 lb 3.2 oz (49.5 kg)  09/01/18 106 lb 9.6 oz (48.4 kg)  03/03/18 101 lb 3.2 oz (45.9 kg)     Health Maintenance Due  Topic Date Due  . MAMMOGRAM  06/08/2008    There are no preventive care reminders to display for this patient.  Lab Results  Component Value Date   TSH 0.656 09/16/2017   Lab Results  Component Value Date   WBC 5.7 09/01/2018   HGB 10.7 (L) 09/01/2018   HCT 34.4 09/01/2018   MCV 73 (L) 09/01/2018   PLT 216 09/01/2018   Lab Results  Component Value Date   NA 143 09/01/2018   K 4.4 09/01/2018   CO2 22 09/01/2018   GLUCOSE 92 09/01/2018   BUN 27 09/01/2018   CREATININE 1.36 (H) 09/01/2018   BILITOT 0.5 09/01/2018   ALKPHOS 109 09/01/2018   AST 23 09/01/2018   ALT 15 09/01/2018   PROT 8.0 09/01/2018   ALBUMIN 4.8 09/01/2018   CALCIUM 10.1 09/01/2018   ANIONGAP 11 07/28/2017   Lab Results  Component Value Date   CHOL 210 (H) 09/01/2018   Lab Results  Component Value Date   HDL 58 09/01/2018   Lab Results  Component Value Date   LDLCALC 125 (H) 09/01/2018   Lab Results  Component Value Date   TRIG 137 09/01/2018   Lab Results  Component Value Date   CHOLHDL 3.6 09/01/2018   Lab Results  Component Value Date   HGBA1C 5.1 12/02/2018      Assessment & Plan:   Problem List Items Addressed This Visit    Hypertension   Relevant Medications    atorvastatin (LIPITOR) 40 MG tablet   hydrochlorothiazide (HYDRODIURIL) 25 MG tablet   amLODipine (NORVASC) 5 MG tablet   Other Relevant Orders   CBC with Differential   Comprehensive metabolic panel   GERD (gastroesophageal reflux disease)   Relevant Medications   senna-docusate (SENOKOT-S) 8.6-50 MG tablet  Other Visit Diagnoses    Screening for diabetes mellitus    -  Primary   Relevant Orders   HgB A1c (Completed)   Other iron deficiency anemia       Relevant Orders   CBC with Differential   Medication refill       Relevant Medications   topiramate (TOPAMAX) 25 MG tablet   Dysuria       Relevant Orders   POCT URINALYSIS DIP (CLINITEK) (Completed)   Mixed hyperlipidemia       Relevant Medications   atorvastatin (LIPITOR) 40 MG tablet   hydrochlorothiazide (HYDRODIURIL) 25 MG tablet   amLODipine (NORVASC) 5 MG tablet   Other Relevant Orders   Lipid panel   Non-seasonal allergic rhinitis due to pollen        [facility]  Diagnoses and all orders for this visit:  Screening for diabetes mellitus -     HgB A1c 5.1  Hypertension, unspecified type Blood pressure is elevated today she has been without her medication of amlodipine and HCTZ increased amlodipine to 5  mg daily and discontinued the 2. 5 mg.  She has a history of a CVA blood pressure needs to be well controlled. -     atorvastatin (LIPI5TOR) 40 MG tablet; Take 1 tablet (40 mg total) by mouth daily at 6 PM. -     hydrochlorothiazide (HYDRODIURIL) 25 MG tablet; Take 1 tablet (25 mg total) by mouth daily. -     CBC with Differential -     Comprehensive metabolic panel -     amLODipine (NORVASC) 10 MG tablet; Take 1tablets  by mouth daily.  Gastroesophageal reflux disease without  Per patient's daughter omeprazole is not working her stomach is burning or in pain, she has noticed increased pain when she tries to make her tea with a lot of citric ingredients.  On examination she did have  bilateral lower quadrant abdominal pain.  This also may be with issues of constipation.  Other iron deficiency anemia September 01, 2018 hemoglobin 10.7 hematocrit 34.4, RDW 15.9.  Taking over-the-counter iron sulfate daily -     CBC with Differential  Medication refill Migraines without aura  -     topiramate (TOPAMAX) 25 MG tablet; Take 1 tablet (25 mg total) by mouth 2 (two) times daily.   Other orders -     senna-docusate (SENOKOT-S) 8.6-50 MG tablet; Take 1 tablet by mouth daily.   Screening for diabetes mellitus -     HgB A1c  Gastroesophageal reflux disease without esophagitis Advised to not lay down after eating decrease acidic drinks and foods changed omeprazole to Protonix to improve effectiveness  Other iron deficiency anemia -     CBC with Differential  Mixed hyperlipidemia September 01, 2018 cholesterol 210 HDL 125 previous CVA currently on  -     Lipid panel  Non-seasonal allergic rhinitis due to pollen   loratadine (CLARITIN) 10 MG tablet; Take 1 tablet (10 mg total) by mouth daily.  Other orders -     senna-docusate (SENOKOT-S) 8.6-50 MG tablet; Take 1 tablet by mouth daily. -     loratadine (CLARITIN) 10 MG tablet; Take 1 tablet (10 mg total) by mouth daily.    Meds ordered this encounter  Medications  . atorvastatin (LIPITOR) 40 MG tablet    Sig: Take 1 tablet (40 mg total) by mouth daily at 6 PM.    Dispense:  30 tablet    Refill:  3  .  hydrochlorothiazide (HYDRODIURIL) 25 MG tablet    Sig: Take 1 tablet (25 mg total) by mouth daily.    Dispense:  30 tablet    Refill:  3  . senna-docusate (SENOKOT-S) 8.6-50 MG tablet    Sig: Take 1 tablet by mouth daily.    Dispense:  30 tablet    Refill:  3  . amLODipine (NORVASC) 5 MG tablet    Sig: Take 0.5 tablets (2.5 mg total) by mouth daily.    Dispense:  30 tablet    Refill:  3  . topiramate (TOPAMAX) 25 MG tablet    Sig: Take 1 tablet (25 mg total) by mouth 2 (two) times daily.    Dispense:  30 tablet     Refill:  3  . loratadine (CLARITIN) 10 MG tablet    Sig: Take 1 tablet (10 mg total) by mouth daily.    Dispense:  30 tablet    Refill:  11    Follow-up: Return in about 4 weeks (around 12/30/2018) for BP re check out of medication .    Grayce SessionsMichelle P Edwards, NP

## 2018-12-03 LAB — LIPID PANEL
Chol/HDL Ratio: 2.2 ratio (ref 0.0–4.4)
Cholesterol, Total: 117 mg/dL (ref 100–199)
HDL: 54 mg/dL (ref >39)
LDL Calculated: 41 mg/dL (ref 0–99)
Triglycerides: 110 mg/dL (ref 0–149)
VLDL Cholesterol Cal: 22 mg/dL (ref 5–40)

## 2018-12-03 LAB — CBC WITH DIFFERENTIAL/PLATELET
Basophils Absolute: 0.1 10*3/uL (ref 0.0–0.2)
Basos: 1 %
EOS (ABSOLUTE): 0.8 10*3/uL — ABNORMAL HIGH (ref 0.0–0.4)
Eos: 17 %
Hematocrit: 34.1 % (ref 34.0–46.6)
Hemoglobin: 10.3 g/dL — ABNORMAL LOW (ref 11.1–15.9)
Immature Grans (Abs): 0 10*3/uL (ref 0.0–0.1)
Immature Granulocytes: 0 %
Lymphocytes Absolute: 1.8 10*3/uL (ref 0.7–3.1)
Lymphs: 36 %
MCH: 22.7 pg — ABNORMAL LOW (ref 26.6–33.0)
MCHC: 30.2 g/dL — ABNORMAL LOW (ref 31.5–35.7)
MCV: 75 fL — ABNORMAL LOW (ref 79–97)
Monocytes Absolute: 0.3 10*3/uL (ref 0.1–0.9)
Monocytes: 5 %
Neutrophils Absolute: 2.1 10*3/uL (ref 1.4–7.0)
Neutrophils: 41 %
Platelets: 234 10*3/uL (ref 150–450)
RBC: 4.53 x10E6/uL (ref 3.77–5.28)
RDW: 16.2 % — ABNORMAL HIGH (ref 11.7–15.4)
WBC: 5 10*3/uL (ref 3.4–10.8)

## 2018-12-03 LAB — COMPREHENSIVE METABOLIC PANEL
ALT: 9 IU/L (ref 0–32)
AST: 18 IU/L (ref 0–40)
Albumin/Globulin Ratio: 1.6 (ref 1.2–2.2)
Albumin: 4.7 g/dL (ref 3.8–4.9)
Alkaline Phosphatase: 95 IU/L (ref 39–117)
BUN/Creatinine Ratio: 19 (ref 12–28)
BUN: 21 mg/dL (ref 8–27)
Bilirubin Total: 0.6 mg/dL (ref 0.0–1.2)
CO2: 16 mmol/L — ABNORMAL LOW (ref 20–29)
Calcium: 9.7 mg/dL (ref 8.7–10.3)
Chloride: 112 mmol/L — ABNORMAL HIGH (ref 96–106)
Creatinine, Ser: 1.11 mg/dL — ABNORMAL HIGH (ref 0.57–1.00)
GFR calc Af Amer: 62 mL/min/{1.73_m2} (ref >59)
GFR calc non Af Amer: 54 mL/min/{1.73_m2} — ABNORMAL LOW (ref >59)
Globulin, Total: 2.9 g/dL (ref 1.5–4.5)
Glucose: 83 mg/dL (ref 65–99)
Potassium: 3.7 mmol/L (ref 3.5–5.2)
Sodium: 144 mmol/L (ref 134–144)
Total Protein: 7.6 g/dL (ref 6.0–8.5)

## 2018-12-04 ENCOUNTER — Other Ambulatory Visit (INDEPENDENT_AMBULATORY_CARE_PROVIDER_SITE_OTHER): Payer: Self-pay | Admitting: Primary Care

## 2018-12-04 DIAGNOSIS — I10 Essential (primary) hypertension: Secondary | ICD-10-CM

## 2018-12-04 MED ORDER — ATORVASTATIN CALCIUM 20 MG PO TABS: 40.0000 mg | ORAL_TABLET | Freq: Every day | ORAL | 3 refills | Status: DC

## 2018-12-06 ENCOUNTER — Telehealth: Payer: Self-pay

## 2018-12-06 NOTE — Telephone Encounter (Signed)
Patients daughter is aware of results. No prescription for iron has been sent to pharmacy. Please send Rx. Nat Christen, CMA

## 2018-12-06 NOTE — Telephone Encounter (Signed)
-----   Message from Kerin Perna, NP sent at 12/04/2018 11:59 AM EDT ----- Chol improved, on iron over the counter will add a prescription for anemia , kidney function improved. Reduced atrovastatin to 20mg  have patient to take 1/2 pill if scored if not she can take 1 pill every other day

## 2018-12-20 MED FILL — ?TOPIRAMATE 25MG TABLET: 25 | 15 days supply | Qty: 30 | Fill #1

## 2018-12-20 MED FILL — ?ATORVASTATIN 40MG TABLET: 40 | 30 days supply | Qty: 30 | Fill #1

## 2018-12-20 MED FILL — ?HYDROCHLOROTHIAZIDE 25MG T: 25 | 30 days supply | Qty: 30 | Fill #1

## 2018-12-20 MED FILL — ?AMLODIPINE BESYLATE 5MG TA: 5 | 30 days supply | Qty: 30 | Fill #1

## 2018-12-30 ENCOUNTER — Other Ambulatory Visit: Payer: Self-pay

## 2018-12-30 ENCOUNTER — Encounter (INDEPENDENT_AMBULATORY_CARE_PROVIDER_SITE_OTHER): Payer: Self-pay | Admitting: Primary Care

## 2018-12-30 ENCOUNTER — Ambulatory Visit (INDEPENDENT_AMBULATORY_CARE_PROVIDER_SITE_OTHER): Payer: Self-pay | Admitting: Primary Care

## 2018-12-30 VITALS — BP 120/78 | HR 76 | Temp 97.6°F | Ht <= 58 in | Wt 106.2 lb

## 2018-12-30 DIAGNOSIS — R51 Headache: Secondary | ICD-10-CM

## 2018-12-30 DIAGNOSIS — I10 Essential (primary) hypertension: Secondary | ICD-10-CM

## 2018-12-30 DIAGNOSIS — D508 Other iron deficiency anemias: Secondary | ICD-10-CM

## 2018-12-30 DIAGNOSIS — R519 Headache, unspecified: Secondary | ICD-10-CM

## 2018-12-30 NOTE — Progress Notes (Signed)
Pt complains of ringing in her ear on the left side when she has a headache Pt complains of feeling cold often

## 2018-12-30 NOTE — Patient Instructions (Signed)
Anemia  Anemia is a condition in which you do not have enough red blood cells or hemoglobin. Hemoglobin is a substance in red blood cells that carries oxygen. When you do not have enough red blood cells or hemoglobin (are anemic), your body cannot get enough oxygen and your organs may not work properly. As a result, you may feel very tired or have other problems. What are the causes? Common causes of anemia include:  Excessive bleeding. Anemia can be caused by excessive bleeding inside or outside the body, including bleeding from the intestine or from periods in women.  Poor nutrition.  Long-lasting (chronic) kidney, thyroid, and liver disease.  Bone marrow disorders.  Cancer and treatments for cancer.  HIV (human immunodeficiency virus) and AIDS (acquired immunodeficiency syndrome).  Treatments for HIV and AIDS.  Spleen problems.  Blood disorders.  Infections, medicines, and autoimmune disorders that destroy red blood cells. What are the signs or symptoms? Symptoms of this condition include:  Minor weakness.  Dizziness.  Headache.  Feeling heartbeats that are irregular or faster than normal (palpitations).  Shortness of breath, especially with exercise.  Paleness.  Cold sensitivity.  Indigestion.  Nausea.  Difficulty sleeping.  Difficulty concentrating. Symptoms may occur suddenly or develop slowly. If your anemia is mild, you may not have symptoms. How is this diagnosed? This condition is diagnosed based on:  Blood tests.  Your medical history.  A physical exam.  Bone marrow biopsy. Your health care provider may also check your stool (feces) for blood and may do additional testing to look for the cause of your bleeding. You may also have other tests, including:  Imaging tests, such as a CT scan or MRI.  Endoscopy.  Colonoscopy. How is this treated? Treatment for this condition depends on the cause. If you continue to lose a lot of blood, you may  need to be treated at a hospital. Treatment may include:  Taking supplements of iron, vitamin S31, or folic acid.  Taking a hormone medicine (erythropoietin) that can help to stimulate red blood cell growth.  Having a blood transfusion. This may be needed if you lose a lot of blood.  Making changes to your diet.  Having surgery to remove your spleen. Follow these instructions at home:  Take over-the-counter and prescription medicines only as told by your health care provider.  Take supplements only as told by your health care provider.  Follow any diet instructions that you were given.  Keep all follow-up visits as told by your health care provider. This is important. Contact a health care provider if:  You develop new bleeding anywhere in the body. Get help right away if:  You are very weak.  You are short of breath.  You have pain in your abdomen or chest.  You are dizzy or feel faint.  You have trouble concentrating.  You have bloody or black, tarry stools.  You vomit repeatedly or you vomit up blood. Summary  Anemia is a condition in which you do not have enough red blood cells or enough of a substance in your red blood cells that carries oxygen (hemoglobin).  Symptoms may occur suddenly or develop slowly.  If your anemia is mild, you may not have symptoms.  This condition is diagnosed with blood tests as well as a medical history and physical exam. Other tests may be needed.  Treatment for this condition depends on the cause of the anemia. This information is not intended to replace advice given to you by  your health care provider. Make sure you discuss any questions you have with your health care provider. Document Released: 07/02/2004 Document Revised: 05/07/2017 Document Reviewed: 06/26/2016 Elsevier Patient Education  2020 [employer].

## 2018-12-30 NOTE — Progress Notes (Signed)
Acute Office Visit  Subjective:    Patient ID: Diane Berry, female    DOB: [birth year], 58 y.o.   MRN: [id number]  Chief Complaint  Patient presents with  . Blood Pressure Check    HPI Patient is in today for an acute visit ringing in her ears, headache and feeling cold again. Folling up for hypertension. Denies shortness of breath, chest pain or lower extremity edema  Past Medical History:  Diagnosis Date  . Stroke ([place]) 07/24/2017   intraventricular hemorrhage    Past Surgical History:  Procedure Laterality Date  . IR ANGIO INTRA EXTRACRAN SEL COM CAROTID INNOMINATE BILAT MOD SED  07/27/2017  . IR ANGIO VERTEBRAL SEL VERTEBRAL BILAT MOD SED  07/27/2017    Family History  Problem Relation Age of Onset  . Hypertension Sister     Social History   Socioeconomic History  . Marital status: Widowed    Spouse name: Not on file  . Number of children: Not on file  . Years of education: Not on file  . Highest education level: Not on file  Occupational History  . Not on file  Social Needs  . Financial resource strain: Not on file  . Food insecurity    Worry: Not on file    Inability: Not on file  . Transportation needs    Medical: Not on file    Non-medical: Not on file  Tobacco Use  . Smoking status: Never Smoker  . Smokeless tobacco: Never Used  Substance and Sexual Activity  . Alcohol use: No    Frequency: Never  . Drug use: No  . Sexual activity: Not Currently  Lifestyle  . Physical activity    Days per week: Not on file    Minutes per session: Not on file  . Stress: Not on file  Relationships  . Social Herbalist on phone: Not on file    Gets together: Not on file    Attends religious service: Not on file    Active member of club or organization: Not on file    Attends meetings of clubs or organizations: Not on file    Relationship status: Not on file  . Intimate partner violence    Fear of current or ex partner: Not on file    Emotionally  abused: Not on file    Physically abused: Not on file    Forced sexual activity: Not on file  Other Topics Concern  . Not on file  Social History Narrative  . Not on file    Outpatient Medications Prior to Visit  Medication Sig Dispense Refill  . acetaminophen (TYLENOL) 500 MG tablet Take 1 tablet (500 mg total) by mouth every 8 (eight) hours as needed. 90 tablet 0  . amLODipine (NORVASC) 5 MG tablet Take 0.5 tablets (2.5 mg total) by mouth daily. 30 tablet 3  . atorvastatin (LIPITOR) 20 MG tablet Take 2 tablets (40 mg total) by mouth daily at 6 PM. 30 tablet 3  . hydrochlorothiazide (HYDRODIURIL) 25 MG tablet Take 1 tablet (25 mg total) by mouth daily. 30 tablet 3  . loratadine (CLARITIN) 10 MG tablet Take 1 tablet (10 mg total) by mouth daily. 30 tablet 11  . senna-docusate (SENOKOT-S) 8.6-50 MG tablet Take 1 tablet by mouth daily. 30 tablet 3  . topiramate (TOPAMAX) 25 MG tablet Take 1 tablet (25 mg total) by mouth 2 (two) times daily. 30 tablet 3   No facility-administered medications prior to  visit.     No Known Allergies  Review of Systems  Constitutional: Positive for chills.  HENT: Positive for tinnitus.   Neurological: Positive for headaches.       Objective:    Physical Exam  Constitutional: She is oriented to person, place, and time. She appears well-developed.  HENT:  Head: Normocephalic.  Neck: Neck supple.  Cardiovascular: Normal rate and regular rhythm.  Pulmonary/Chest: Effort normal and breath sounds normal.  Abdominal: Soft. Bowel sounds are normal.  Musculoskeletal: Normal range of motion.  Neurological: She is oriented to person, place, and time.  Skin: Skin is warm.  Psychiatric: She has a normal mood and affect.    Ht 4\' 9"  (1.448 m)   Wt 106 lb 3.2 oz (48.2 kg)   BMI 22.98 kg/m  Wt Readings from Last 3 Encounters:  12/30/18 106 lb 3.2 oz (48.2 kg)  12/02/18 109 lb 3.2 oz (49.5 kg)  09/01/18 106 lb 9.6 oz (48.4 kg)    Health Maintenance  Due  Topic Date Due  . MAMMOGRAM  06/08/2008    There are no preventive care reminders to display for this patient.   Lab Results  Component Value Date   TSH 0.656 09/16/2017   Lab Results  Component Value Date   WBC 5.0 12/02/2018   HGB 10.3 (L) 12/02/2018   HCT 34.1 12/02/2018   MCV 75 (L) 12/02/2018   PLT 234 12/02/2018   Lab Results  Component Value Date   NA 144 12/02/2018   K 3.7 12/02/2018   CO2 16 (L) 12/02/2018   GLUCOSE 83 12/02/2018   BUN 21 12/02/2018   CREATININE 1.11 (H) 12/02/2018   BILITOT 0.6 12/02/2018   ALKPHOS 95 12/02/2018   AST 18 12/02/2018   ALT 9 12/02/2018   PROT 7.6 12/02/2018   ALBUMIN 4.7 12/02/2018   CALCIUM 9.7 12/02/2018   ANIONGAP 11 07/28/2017   Lab Results  Component Value Date   CHOL 117 12/02/2018   Lab Results  Component Value Date   HDL 54 12/02/2018   Lab Results  Component Value Date   LDLCALC 41 12/02/2018   Lab Results  Component Value Date   TRIG 110 12/02/2018   Lab Results  Component Value Date   CHOLHDL 2.2 12/02/2018   Lab Results  Component Value Date   HGBA1C 5.1 12/02/2018       Assessment & Plan:   Problem List Items Addressed This Visit    None     Tomasa was seen today for blood pressure check.  Diagnoses and all orders for this visit:  Hypertension, unspecified type Improvement. Counseled on blood pressure goal of less than 130/80, low-sodium, DASH diet, medication compliance, 150 minutes of moderate intensity exercise per week. Discussed medication compliance, adverse effects.  Headache around the eyes Signs can be individualized with throbbing pain on any area of the head. This  may be triggered or caused by: Caffeine, aged cheese, and chocolate. Continue to alternate tylenol and ibuprofen.  Other iron deficiency anemia Hemoglobin is low low MCh, MCHC and RDW continue taking iron supplement .  Iron rich foods such as shellfish,liver, organ meats(liver, gizzard), and red meats can  increase cholesterol and should be consumed in moderation.However; legumes(beans), spinach, pumpkin seeds, [place], broccoli, tofu, green leafy vegetables and dark chocolate can be consumed without concern to cholesterol.    No orders of the defined types were placed in this encounter.    Grayce SessionsMichelle P Megan Presti, NP

## 2019-01-13 ENCOUNTER — Telehealth (INDEPENDENT_AMBULATORY_CARE_PROVIDER_SITE_OTHER): Payer: Self-pay | Admitting: Primary Care

## 2019-01-13 NOTE — Telephone Encounter (Signed)
Patient daughter called stating her mother  was weak and dizzy she caught her before she fell. LOC changed advised her to take her mother to the ED for evaluation.

## 2019-01-16 MED FILL — ?ATORVASTATIN 40MG TABLET: 40 | 30 days supply | Qty: 30 | Fill #2

## 2019-01-16 MED FILL — ?HYDROCHLOROTHIAZIDE 25MG T: 25 | 30 days supply | Qty: 30 | Fill #2

## 2019-01-16 MED FILL — TOPIRAMATE 25 MG TABS: 25 | 15 days supply | Qty: 30 | Fill #2

## 2019-01-16 MED FILL — ?AMLODIPINE BESYLATE 5MG TA: 5 | 30 days supply | Qty: 15 | Fill #2

## 2019-01-31 MED FILL — TOPIRAMATE 25 MG TABS: 25 | 15 days supply | Qty: 30 | Fill #3

## 2019-01-31 MED FILL — ATORVASTATIN CALCIUM 40 MG: 40 | 30 days supply | Qty: 30 | Fill #3

## 2019-01-31 MED FILL — HYDROCHLOROTHIAZIDE 25 MG T: 25 | 30 days supply | Qty: 30 | Fill #3

## 2019-01-31 MED FILL — AMLODIPINE BESYLATE 5 MG TA: 5 | 30 days supply | Qty: 15 | Fill #3

## 2019-02-27 MED FILL — AMLODIPINE BESYLATE 5 MG TA: 5 | 30 days supply | Qty: 15 | Fill #4

## 2019-02-27 MED FILL — ATORVASTATIN CALCIUM 40 MG: 40 | 30 days supply | Qty: 30 | Fill #2

## 2019-02-27 MED FILL — TOPIRAMATE 25 MG TAB: 25 | 30 days supply | Qty: 60 | Fill #1

## 2019-02-27 MED FILL — HYDROCHLOROTHIAZIDE 25 MG T: 25 | 30 days supply | Qty: 30 | Fill #2

## 2019-03-27 MED FILL — ATORVASTATIN CALCIUM 40 MG: 40 | 30 days supply | Qty: 30 | Fill #3

## 2019-03-27 MED FILL — AMLODIPINE BESYLATE 5 MG TA: 5 | 30 days supply | Qty: 15 | Fill #5

## 2019-03-27 MED FILL — TOPIRAMATE 25 MG TAB: 25 | 15 days supply | Qty: 30 | Fill #2

## 2019-03-27 MED FILL — HYDROCHLOROTHIAZIDE 25 MG T: 25 | 30 days supply | Qty: 30 | Fill #3

## 2019-04-03 ENCOUNTER — Ambulatory Visit (INDEPENDENT_AMBULATORY_CARE_PROVIDER_SITE_OTHER): Payer: Self-pay | Admitting: Primary Care

## 2019-04-17 ENCOUNTER — Other Ambulatory Visit: Payer: Self-pay

## 2019-04-17 ENCOUNTER — Ambulatory Visit (INDEPENDENT_AMBULATORY_CARE_PROVIDER_SITE_OTHER): Payer: No Typology Code available for payment source | Admitting: Primary Care

## 2019-04-17 VITALS — BP 132/82 | HR 67 | Temp 97.7°F | Ht <= 58 in | Wt 105.2 lb

## 2019-04-17 DIAGNOSIS — G43109 Migraine with aura, not intractable, without status migrainosus: Secondary | ICD-10-CM | POA: Diagnosis not present

## 2019-04-17 DIAGNOSIS — Z1231 Encounter for screening mammogram for malignant neoplasm of breast: Secondary | ICD-10-CM

## 2019-04-17 DIAGNOSIS — E785 Hyperlipidemia, unspecified: Secondary | ICD-10-CM

## 2019-04-17 DIAGNOSIS — I10 Essential (primary) hypertension: Secondary | ICD-10-CM | POA: Diagnosis not present

## 2019-04-17 DIAGNOSIS — K5900 Constipation, unspecified: Secondary | ICD-10-CM

## 2019-04-17 DIAGNOSIS — Z760 Encounter for issue of repeat prescription: Secondary | ICD-10-CM

## 2019-04-17 MED ORDER — TOPIRAMATE 25 MG PO TABS: 25.0000 mg | ORAL_TABLET | Freq: Two times a day (BID) | ORAL | 3 refills | Status: DC

## 2019-04-17 MED ORDER — LORATADINE 10 MG PO TABS: 10.0000 mg | ORAL_TABLET | Freq: Every day | ORAL | 11 refills | Status: DC

## 2019-04-17 MED ORDER — AMLODIPINE BESYLATE 5 MG PO TABS: 2.5000 mg | ORAL_TABLET | Freq: Every day | ORAL | 3 refills | Status: DC

## 2019-04-17 MED ORDER — ATORVASTATIN CALCIUM 20 MG PO TABS: 40.0000 mg | ORAL_TABLET | Freq: Every day | ORAL | 3 refills | Status: DC

## 2019-04-17 MED ORDER — HYDROCHLOROTHIAZIDE 25 MG PO TABS: 25.0000 mg | ORAL_TABLET | Freq: Every day | ORAL | 3 refills | Status: DC

## 2019-04-17 NOTE — Patient Instructions (Signed)
Managing Your Hypertension Hypertension is commonly called high blood pressure. This is when the force of your blood pressing against the walls of your arteries is too strong. Arteries are blood vessels that carry blood from your heart throughout your body. Hypertension forces the heart to work harder to pump blood, and may cause the arteries to become narrow or stiff. Having untreated or uncontrolled hypertension can cause heart attack, stroke, kidney disease, and other problems. What are blood pressure readings? A blood pressure reading consists of a higher number over a lower number. Ideally, your blood pressure should be below 120/80. The first ("top") number is called the systolic pressure. It is a measure of the pressure in your arteries as your heart beats. The second ("bottom") number is called the diastolic pressure. It is a measure of the pressure in your arteries as the heart relaxes. What does my blood pressure reading mean? Blood pressure is classified into four stages. Based on your blood pressure reading, your health care provider may use the following stages to determine what type of treatment you need, if any. Systolic pressure and diastolic pressure are measured in a unit called mm Hg. Normal  Systolic pressure: below 120.  Diastolic pressure: below 80. Elevated  Systolic pressure: 120-129.  Diastolic pressure: below 80. Hypertension stage 1  Systolic pressure: 130-139.  Diastolic pressure: 80-89. Hypertension stage 2  Systolic pressure: 140 or above.  Diastolic pressure: 90 or above. What health risks are associated with hypertension? Managing your hypertension is an important responsibility. Uncontrolled hypertension can lead to:  A heart attack.  A stroke.  A weakened blood vessel (aneurysm).  Heart failure.  Kidney damage.  Eye damage.  Metabolic syndrome.  Memory and concentration problems. What changes can I make to manage my  hypertension? Hypertension can be managed by making lifestyle changes and possibly by taking medicines. Your health care provider will help you make a plan to bring your blood pressure within a normal range. Eating and drinking   Eat a diet that is high in fiber and potassium, and low in salt (sodium), added sugar, and fat. An example eating plan is called the DASH (Dietary Approaches to Stop Hypertension) diet. To eat this way: ? Eat plenty of fresh fruits and vegetables. Try to fill half of your plate at each meal with fruits and vegetables. ? Eat whole grains, such as whole wheat pasta, brown rice, or whole grain bread. Fill about one quarter of your plate with whole grains. ? Eat low-fat diary products. ? Avoid fatty cuts of meat, processed or cured meats, and poultry with skin. Fill about one quarter of your plate with lean proteins such as fish, chicken without skin, beans, eggs, and tofu. ? Avoid premade and processed foods. These tend to be higher in sodium, added sugar, and fat.  Reduce your daily sodium intake. Most people with hypertension should eat less than 1,500 mg of sodium a day.  Limit alcohol intake to no more than 1 drink a day for nonpregnant women and 2 drinks a day for men. One drink equals 12 oz of beer, 5 oz of wine, or 1 oz of hard liquor. Lifestyle  Work with your health care provider to maintain a healthy body weight, or to lose weight. Ask what an ideal weight is for you.  Get at least 30 minutes of exercise that causes your heart to beat faster (aerobic exercise) most days of the week. Activities may include walking, swimming, or biking.  Include exercise   to strengthen your muscles (resistance exercise), such as weight lifting, as part of your weekly exercise routine. Try to do these types of exercises for 30 minutes at least 3 days a week.  Do not use any products that contain nicotine or tobacco, such as cigarettes and e-cigarettes. If you need help quitting,  ask your health care provider.  Control any long-term (chronic) conditions you have, such as high cholesterol or diabetes. Monitoring  Monitor your blood pressure at home as told by your health care provider. Your personal target blood pressure may vary depending on your medical conditions, your age, and other factors.  Have your blood pressure checked regularly, as often as told by your health care provider. Working with your health care provider  Review all the medicines you take with your health care provider because there may be side effects or interactions.  Talk with your health care provider about your diet, exercise habits, and other lifestyle factors that may be contributing to hypertension.  Visit your health care provider regularly. Your health care provider can help you create and adjust your plan for managing hypertension. Will I need medicine to control my blood pressure? Your health care provider may prescribe medicine if lifestyle changes are not enough to get your blood pressure under control, and if:  Your systolic blood pressure is 130 or higher.  Your diastolic blood pressure is 80 or higher. Take medicines only as told by your health care provider. Follow the directions carefully. Blood pressure medicines must be taken as prescribed. The medicine does not work as well when you skip doses. Skipping doses also puts you at risk for problems. Contact a health care provider if:  You think you are having a reaction to medicines you have taken.  You have repeated (recurrent) headaches.  You feel dizzy.  You have swelling in your ankles.  You have trouble with your vision. Get help right away if:  You develop a severe headache or confusion.  You have unusual weakness or numbness, or you feel faint.  You have severe pain in your chest or abdomen.  You vomit repeatedly.  You have trouble breathing. Summary  Hypertension is when the force of blood pumping  through your arteries is too strong. If this condition is not controlled, it may put you at risk for serious complications.  Your personal target blood pressure may vary depending on your medical conditions, your age, and other factors. For most people, a normal blood pressure is less than 120/80.  Hypertension is managed by lifestyle changes, medicines, or both. Lifestyle changes include weight loss, eating a healthy, low-sodium diet, exercising more, and limiting alcohol. This information is not intended to replace advice given to you by your health care provider. Make sure you discuss any questions you have with your health care provider. Document Released: 02/17/2012 Document Revised: 09/16/2018 Document Reviewed: 04/22/2016 Elsevier Patient Education  2020 [employer]  

## 2019-04-17 NOTE — Progress Notes (Signed)
Established Patient Office Visit  Subjective:  Patient ID: Diane Berry, female    DOB: 1960-09-25  Age: 58 y.o. MRN: [id number]  CC:  Chief Complaint  Patient presents with  . Hypertension    HPI Leeanna Rah Daryl Eastern presents for management of co morbidities and medication refills . Only concerned voiced was constipation.  Past Medical History:  Diagnosis Date  . Stroke (HCC) 07/24/2017   intraventricular hemorrhage    Past Surgical History:  Procedure Laterality Date  . IR ANGIO INTRA EXTRACRAN SEL COM CAROTID INNOMINATE BILAT MOD SED  07/27/2017  . IR ANGIO VERTEBRAL SEL VERTEBRAL BILAT MOD SED  07/27/2017    Family History  Problem Relation Age of Onset  . Hypertension Sister     Social History   Socioeconomic History  . Marital status: Widowed    Spouse name: Not on file  . Number of children: Not on file  . Years of education: Not on file  . Highest education level: Not on file  Occupational History  . Not on file  Social Needs  . Financial resource strain: Not on file  . Food insecurity    Worry: Not on file    Inability: Not on file  . Transportation needs    Medical: Not on file    Non-medical: Not on file  Tobacco Use  . Smoking status: Never Smoker  . Smokeless tobacco: Never Used  Substance and Sexual Activity  . Alcohol use: No    Frequency: Never  . Drug use: No  . Sexual activity: Not Currently  Lifestyle  . Physical activity    Days per week: Not on file    Minutes per session: Not on file  . Stress: Not on file  Relationships  . Social Musician on phone: Not on file    Gets together: Not on file    Attends religious service: Not on file    Active member of club or organization: Not on file    Attends meetings of clubs or organizations: Not on file    Relationship status: Not on file  . Intimate partner violence    Fear of current or ex partner: Not on file    Emotionally abused: Not on file    Physically abused: Not on  file    Forced sexual activity: Not on file  Other Topics Concern  . Not on file  Social History Narrative  . Not on file    Outpatient Medications Prior to Visit  Medication Sig Dispense Refill  . amLODipine (NORVASC) 5 MG tablet Take 0.5 tablets (2.5 mg total) by mouth daily. 30 tablet 3  . atorvastatin (LIPITOR) 20 MG tablet Take 2 tablets (40 mg total) by mouth daily at 6 PM. 30 tablet 3  . hydrochlorothiazide (HYDRODIURIL) 25 MG tablet Take 1 tablet (25 mg total) by mouth daily. 30 tablet 3  . topiramate (TOPAMAX) 25 MG tablet Take 1 tablet (25 mg total) by mouth 2 (two) times daily. 30 tablet 3  . senna-docusate (SENOKOT-S) 8.6-50 MG tablet Take 1 tablet by mouth daily. (Patient not taking: Reported on 12/30/2018) 30 tablet 3  . acetaminophen (TYLENOL) 500 MG tablet Take 1 tablet (500 mg total) by mouth every 8 (eight) hours as needed. (Patient not taking: Reported on 12/30/2018) 90 tablet 0  . loratadine (CLARITIN) 10 MG tablet Take 1 tablet (10 mg total) by mouth daily. (Patient not taking: Reported on 12/30/2018) 30 tablet 11  No facility-administered medications prior to visit.     No Known Allergies  ROS Review of Systems  Gastrointestinal: Positive for constipation.  Neurological: Positive for headaches.  All other systems reviewed and are negative.     Objective:    Physical Exam  Constitutional: She is oriented to person, place, and time. She appears well-developed and well-nourished.  HENT:  Head: Normocephalic.  Eyes: Pupils are equal, round, and reactive to light. EOM are normal.  Neck: Neck supple.  Cardiovascular: Normal rate and regular rhythm.  Pulmonary/Chest: Effort normal and breath sounds normal.  Abdominal: Soft. Bowel sounds are normal.  Musculoskeletal: Normal range of motion.  Neurological: She is oriented to person, place, and time.  Skin: Skin is warm and dry.  Psychiatric: She has a normal mood and affect.    BP 132/82 (BP Location: Right  Arm, Patient Position: Sitting, Cuff Size: Normal)   Pulse 67   Temp 97.7 F (36.5 C) (Oral)   Ht 4\' 9"  (1.448 m)   Wt 105 lb 3.2 oz (47.7 kg)   SpO2 100%   BMI 22.77 kg/m  Wt Readings from Last 3 Encounters:  04/17/19 105 lb 3.2 oz (47.7 kg)  12/30/18 106 lb 3.2 oz (48.2 kg)  12/02/18 109 lb 3.2 oz (49.5 kg)     Health Maintenance Due  Topic Date Due  . MAMMOGRAM  06/08/2008    There are no preventive care reminders to display for this patient.  Lab Results  Component Value Date   TSH 0.656 09/16/2017   Lab Results  Component Value Date   WBC 5.0 12/02/2018   HGB 10.3 (L) 12/02/2018   HCT 34.1 12/02/2018   MCV 75 (L) 12/02/2018   PLT 234 12/02/2018   Lab Results  Component Value Date   NA 142 04/17/2019   K 3.3 (L) 04/17/2019   CO2 21 04/17/2019   GLUCOSE 91 04/17/2019   BUN 21 04/17/2019   CREATININE 1.32 (H) 04/17/2019   BILITOT 0.5 04/17/2019   ALKPHOS 102 04/17/2019   AST 24 04/17/2019   ALT 9 04/17/2019   PROT 7.8 04/17/2019   ALBUMIN 4.6 04/17/2019   CALCIUM 9.6 04/17/2019   ANIONGAP 11 07/28/2017   Lab Results  Component Value Date   CHOL 106 04/17/2019   Lab Results  Component Value Date   HDL 48 04/17/2019   Lab Results  Component Value Date   LDLCALC 37 04/17/2019   Lab Results  Component Value Date   TRIG 114 04/17/2019   Lab Results  Component Value Date   CHOLHDL 2.2 04/17/2019   Lab Results  Component Value Date   HGBA1C 5.1 12/02/2018      Assessment & Plan:  Kaegan was seen today for hypertension.  Diagnoses and all orders for this visit:  Essential hypertension Controlled 58 years old Bp today 132/82 on hydrochlorothiazide 25 mg daily, amlodipine 2.5 mg daily -     Comprehensive metabolic panel  Elevated lipids On atorvastatin 40 mg at bedtime will obtain a lipid panel  Constipation, unspecified constipation type Increase fruits and vegetables in diet encouraged to drink 64 ounces of water daily and may try  over-the-counter fiber supplements such as Metamucil  Migraine with aura and without status migrainosus, not intractable topiramate (TOPAMAX) 25 MG tablet   Sig: Take 1 tablet (25 mg total) by mouth 2 (two) times daily.   Dispense:  30 tablet   Refill:  3   Encounter for screening mammogram for malignant neoplasm of  breast -     Cancel: MM Digital Diagnostic Bilat; Future -     MM DIGITAL SCREENING BILATERAL; Future  Other orders -     loratadine (CLARITIN) 10 MG tablet; Take 1 tablet (10 mg total) by mouth daily.    Meds ordered this encounter  Medications  . hydrochlorothiazide (HYDRODIURIL) 25 MG tablet    Sig: Take 1 tablet (25 mg total) by mouth daily.    Dispense:  30 tablet    Refill:  3  . amLODipine (NORVASC) 5 MG tablet    Sig: Take 0.5 tablets (2.5 mg total) by mouth daily.    Dispense:  30 tablet    Refill:  3  . atorvastatin (LIPITOR) 20 MG tablet    Sig: Take 2 tablets (40 mg total) by mouth daily at 6 PM.    Dispense:  30 tablet    Refill:  3  . loratadine (CLARITIN) 10 MG tablet    Sig: Take 1 tablet (10 mg total) by mouth daily.    Dispense:  30 tablet    Refill:  11    Follow-up: Return in 3 months (on 07/18/2019).    Grayce SessionsMichelle P , NP

## 2019-04-18 ENCOUNTER — Other Ambulatory Visit (INDEPENDENT_AMBULATORY_CARE_PROVIDER_SITE_OTHER): Payer: Self-pay | Admitting: Primary Care

## 2019-04-18 DIAGNOSIS — E876 Hypokalemia: Secondary | ICD-10-CM

## 2019-04-18 LAB — COMPREHENSIVE METABOLIC PANEL
ALT: 9 IU/L (ref 0–32)
AST: 24 IU/L (ref 0–40)
Albumin/Globulin Ratio: 1.4 (ref 1.2–2.2)
Albumin: 4.6 g/dL (ref 3.8–4.9)
Alkaline Phosphatase: 102 IU/L (ref 39–117)
BUN/Creatinine Ratio: 16 (ref 12–28)
BUN: 21 mg/dL (ref 8–27)
Bilirubin Total: 0.5 mg/dL (ref 0.0–1.2)
CO2: 21 mmol/L (ref 20–29)
Calcium: 9.6 mg/dL (ref 8.7–10.3)
Chloride: 105 mmol/L (ref 96–106)
Creatinine, Ser: 1.32 mg/dL — ABNORMAL HIGH (ref 0.57–1.00)
GFR calc Af Amer: 51 mL/min/{1.73_m2} — ABNORMAL LOW (ref >59)
GFR calc non Af Amer: 44 mL/min/{1.73_m2} — ABNORMAL LOW (ref >59)
Globulin, Total: 3.2 g/dL (ref 1.5–4.5)
Glucose: 91 mg/dL (ref 65–99)
Potassium: 3.3 mmol/L — ABNORMAL LOW (ref 3.5–5.2)
Sodium: 142 mmol/L (ref 134–144)
Total Protein: 7.8 g/dL (ref 6.0–8.5)

## 2019-04-18 LAB — LIPID PANEL
Chol/HDL Ratio: 2.2 ratio (ref 0.0–4.4)
Cholesterol, Total: 106 mg/dL (ref 100–199)
HDL: 48 mg/dL (ref >39)
LDL Chol Calc (NIH): 37 mg/dL (ref 0–99)
Triglycerides: 114 mg/dL (ref 0–149)
VLDL Cholesterol Cal: 21 mg/dL (ref 5–40)

## 2019-04-18 MED ORDER — POTASSIUM CHLORIDE ER 10 MEQ PO TBCR: 10.0000 meq | EXTENDED_RELEASE_TABLET | Freq: Every day | ORAL | 3 refills | Status: DC

## 2019-04-18 MED FILL — POTASSIUM CHLORIDE ER 10 ME: 10 | 30 days supply | Qty: 30 | Fill #0

## 2019-04-20 ENCOUNTER — Telehealth (INDEPENDENT_AMBULATORY_CARE_PROVIDER_SITE_OTHER): Payer: Self-pay

## 2019-04-20 NOTE — Telephone Encounter (Signed)
-----   Message from Kerin Perna, NP sent at 04/18/2019  3:12 PM EST ----- Decline in kidney function will monitor increase water and decrease soda. Potassium slightly down sent in supplement-other labs normal

## 2019-04-20 NOTE — Telephone Encounter (Signed)
Patients daughter returned call to [facility]. She is aware that patients kidney function has some decline. Advised to drink plenty water and decrease soda. Daughter states mother does not drink soda. She was also notified that patients potassium is low and that supplement has been sent to pharmacy. All other labs normal. She expressed understanding and will inform patient. Nat Christen, CMA

## 2019-04-24 MED FILL — HYDROCHLOROTHIAZIDE 25 MG T: 25 | 30 days supply | Qty: 30 | Fill #0

## 2019-04-24 MED FILL — AMLODIPINE BESYLATE 5 MG TA: 5 | 30 days supply | Qty: 15 | Fill #0

## 2019-04-24 MED FILL — TOPIRAMATE 25 MG TABS: 25 | 30 days supply | Qty: 60 | Fill #0

## 2019-04-24 MED FILL — ATORVASTATIN CALCIUM 20 MG: 20 | 30 days supply | Qty: 60 | Fill #0

## 2019-05-25 MED FILL — TOPIRAMATE 25 MG TABS: 25 | 30 days supply | Qty: 60 | Fill #1

## 2019-05-25 MED FILL — POTASSIUM CHLORIDE ER 10 ME: 10 | 30 days supply | Qty: 30 | Fill #1

## 2019-05-25 MED FILL — HYDROCHLOROTHIAZIDE 25 MG T: 25 | 30 days supply | Qty: 30 | Fill #1

## 2019-05-25 MED FILL — ATORVASTATIN CALCIUM 20 MG: 20 | 30 days supply | Qty: 60 | Fill #1

## 2019-05-25 MED FILL — AMLODIPINE BESYLATE 5 MG TA: 5 | 30 days supply | Qty: 15 | Fill #1

## 2019-07-17 ENCOUNTER — Other Ambulatory Visit: Payer: Self-pay

## 2019-07-17 ENCOUNTER — Encounter (INDEPENDENT_AMBULATORY_CARE_PROVIDER_SITE_OTHER): Payer: Self-pay | Admitting: Primary Care

## 2019-07-17 ENCOUNTER — Ambulatory Visit (INDEPENDENT_AMBULATORY_CARE_PROVIDER_SITE_OTHER): Payer: No Typology Code available for payment source | Admitting: Primary Care

## 2019-07-17 VITALS — BP 167/90 | HR 71 | Temp 95.0°F | Ht <= 58 in | Wt 109.4 lb

## 2019-07-17 DIAGNOSIS — Z9109 Other allergy status, other than to drugs and biological substances: Secondary | ICD-10-CM

## 2019-07-17 DIAGNOSIS — E785 Hyperlipidemia, unspecified: Secondary | ICD-10-CM

## 2019-07-17 DIAGNOSIS — E876 Hypokalemia: Secondary | ICD-10-CM

## 2019-07-17 DIAGNOSIS — Z760 Encounter for issue of repeat prescription: Secondary | ICD-10-CM

## 2019-07-17 DIAGNOSIS — I10 Essential (primary) hypertension: Secondary | ICD-10-CM

## 2019-07-17 MED ORDER — HYDROCHLOROTHIAZIDE 25 MG PO TABS: 25.0000 mg | ORAL_TABLET | Freq: Every day | ORAL | 1 refills | Status: DC

## 2019-07-17 MED ORDER — AMLODIPINE BESYLATE 5 MG PO TABS: 2.5000 mg | ORAL_TABLET | Freq: Every day | ORAL | 1 refills | Status: DC

## 2019-07-17 MED ORDER — BLOOD PRESSURE MONITOR DEVI: 1.0000 | Freq: Three times a day (TID) | 0 refills | Status: DC

## 2019-07-17 MED ORDER — ATORVASTATIN CALCIUM 40 MG PO TABS: 40.0000 mg | ORAL_TABLET | Freq: Every day | ORAL | 1 refills | Status: DC

## 2019-07-17 MED ORDER — LORATADINE 10 MG PO TABS: 10.0000 mg | ORAL_TABLET | Freq: Every day | ORAL | 3 refills | Status: DC

## 2019-07-17 MED FILL — AMLODIPINE BESYLATE 5 MG TA: 5 | 30 days supply | Qty: 15 | Fill #0

## 2019-07-17 MED FILL — POTASSIUM CHLORIDE ER 10 ME: 10 | 30 days supply | Qty: 30 | Fill #2

## 2019-07-17 MED FILL — ATORVASTATIN CALCIUM 40 MG: 40 | 30 days supply | Qty: 30 | Fill #0

## 2019-07-17 MED FILL — HYDROCHLOROTHIAZIDE 25 MG T: 25 | 30 days supply | Qty: 30 | Fill #0

## 2019-07-17 NOTE — Patient Instructions (Addendum)
Did not refill Potassium until labs are reviewed  Hypertension, Adult Hypertension is another name for high blood pressure. High blood pressure forces your heart to work harder to pump blood. This can cause problems over time. There are two numbers in a blood pressure reading. There is a top number (systolic) over a bottom number (diastolic). It is best to have a blood pressure that is below 120/80. Healthy choices can help lower your blood pressure, or you may need medicine to help lower it. What are the causes? The cause of this condition is not known. Some conditions may be related to high blood pressure. What increases the risk?  Smoking.  Having type 2 diabetes mellitus, high cholesterol, or both.  Not getting enough exercise or physical activity.  Being overweight.  Having too much fat, sugar, calories, or salt (sodium) in your diet.  Drinking too much alcohol.  Having long-term (chronic) kidney disease.  Having a family history of high blood pressure.  Age. Risk increases with age.  Race. You may be at higher risk if you are African American.  Gender. Men are at higher risk than women before age 91. After age 19, women are at higher risk than men.  Having obstructive sleep apnea.  Stress. What are the signs or symptoms?  High blood pressure may not cause symptoms. Very high blood pressure (hypertensive crisis) may cause: ? Headache. ? Feelings of worry or nervousness (anxiety). ? Shortness of breath. ? Nosebleed. ? A feeling of being sick to your stomach (nausea). ? Throwing up (vomiting). ? Changes in how you see. ? Very bad chest pain. ? Seizures. How is this treated?  This condition is treated by making healthy lifestyle changes, such as: ? Eating healthy foods. ? Exercising more. ? Drinking less alcohol.  Your health care provider may prescribe medicine if lifestyle changes are not enough to get your blood pressure under control, and if: ? Your top  number is above 130. ? Your bottom number is above 80.  Your personal target blood pressure may vary. Follow these instructions at home: Eating and drinking   If told, follow the DASH eating plan. To follow this plan: ? Fill one half of your plate at each meal with fruits and vegetables. ? Fill one fourth of your plate at each meal with whole grains. Whole grains include whole-wheat pasta, brown rice, and whole-grain bread. ? Eat or drink low-fat dairy products, such as skim milk or low-fat yogurt. ? Fill one fourth of your plate at each meal with low-fat (lean) proteins. Low-fat proteins include fish, chicken without skin, eggs, beans, and tofu. ? Avoid fatty meat, cured and processed meat, or chicken with skin. ? Avoid pre-made or processed food.  Eat less than 1,500 mg of salt each day.  Do not drink alcohol if: ? Your doctor tells you not to drink. ? You are pregnant, may be pregnant, or are planning to become pregnant.  If you drink alcohol: ? Limit how much you use to:  0-1 drink a day for women.  0-2 drinks a day for men. ? Be aware of how much alcohol is in your drink. In the [place] one drink equals one 12 oz bottle of beer (355 mL), one 5 oz glass of wine (148 mL), or one 1 oz glass of hard liquor (44 mL). Lifestyle   Work with your doctor to stay at a healthy weight or to lose weight. Ask your doctor what the best weight is for  you.  Get at least 30 minutes of exercise most days of the week. This may include walking, swimming, or biking.  Get at least 30 minutes of exercise that strengthens your muscles (resistance exercise) at least 3 days a week. This may include lifting weights or doing Pilates.  Do not use any products that contain nicotine or tobacco, such as cigarettes, e-cigarettes, and chewing tobacco. If you need help quitting, ask your doctor.  Check your blood pressure at home as told by your doctor.  Keep all follow-up visits as told by your doctor.  This is important. Medicines  Take over-the-counter and prescription medicines only as told by your doctor. Follow directions carefully.  Do not skip doses of blood pressure medicine. The medicine does not work as well if you skip doses. Skipping doses also puts you at risk for problems.  Ask your doctor about side effects or reactions to medicines that you should watch for. Contact a doctor if you:  Think you are having a reaction to the medicine you are taking.  Have headaches that keep coming back (recurring).  Feel dizzy.  Have swelling in your ankles.  Have trouble with your vision. Get help right away if you:  Get a very bad headache.  Start to feel mixed up (confused).  Feel weak or numb.  Feel faint.  Have very bad pain in your: ? Chest. ? Belly (abdomen).  Throw up more than once.  Have trouble breathing. Summary  Hypertension is another name for high blood pressure.  High blood pressure forces your heart to work harder to pump blood.  For most people, a normal blood pressure is less than 120/80.  Making healthy choices can help lower blood pressure. If your blood pressure does not get lower with healthy choices, you may need to take medicine. This information is not intended to replace advice given to you by your health care provider. Make sure you discuss any questions you have with your health care provider. Document Revised: 02/02/2018 Document Reviewed: 02/02/2018 Elsevier Patient Education  [employer]. Potassium Content of Foods  The body needs potassium to control blood pressure and to keep the muscles and nervous system healthy. Here are some healthy foods below that are high in potassium. Also you can get the white label salt of "NO SALT" salt substitute, 1/4 teaspoon of this is equivalent to 29meq potassium.   FOODS AND DRINKS HIGH IN POTASSIUM FOODS MODERATE IN POTASSIUM   Fruits  Avocado (cubed),  c / 50 g.  Banana (sliced), 75  g.  Cantaloupe (cubed), 80 g.  Honeydew, 1 wedge / 85 g.  Kiwi (sliced), 90 g.  Nectarine, 1 small / 129 g.  Orange, 1 medium / 131 g. Vegetables  Artichoke,  of a medium / 64 g.  Asparagus (boiled), 90 g..  Broccoli (boiled), 78 g.  Brussels sprout (boiled), 78 g.  Butternut squash (baked), 103 g.  Chickpea (cooked), 82 g.  Green peas (cooked), 80 g.  Kidney beans (cooked), 5 tbsp / 55 g.  Lima beans (cooked),  c / 43 g.  Navy beans (cooked),  c / 61 g.  Spinach (cooked),  c / 45 g.  Sweet potato (baked),  c / 50 g.  Tomato (chopped or sliced), 90 g.  Vegetable juice.  White mushrooms (cooked), 78 g.  Yam (cooked or baked),  c / 34 g.  Zucchini squash (boiled), 90 g. Other Foods and Drinks  Almonds (whole),  c / 36  g.  Fish, 3 oz / 85 g.  Nonfat fruit variety yogurt, 123 g.  Pistachio nuts, 1 oz / 28 g.  Pumpkin seeds, 1 oz / 28 g.  Red meat (broiled, cooked, grilled), 3 oz / 85 g.  Scallops (steamed), 3 oz / 85 g.  Spaghetti sauce,  c / 66 g.  Sunflower seeds (dry roasted), 1 oz / 28 g.  Veggie burger, 1 patty / 70 g. Fruits  Grapefruit,  of the fruit / 123 g  Plums (sliced), 83 g.  Tangerine, 1 large / 120 g. Vegetables  Carrots (boiled), 78 g.  Carrots (sliced), 61 g.  Rhubarb (cooked with sugar), 120 g.  Rutabaga (cooked), 120 g.  Yellow snap beans (cooked), 63 g. Other Foods and Drinks   Chicken breast (roasted and chopped),  c / 70 g.  Pita bread, 1 large / 64 g.  Shrimp (steamed), 4 oz / 113 g.  Swiss cheese (diced), 70 g.

## 2019-07-17 NOTE — Progress Notes (Signed)
Established Patient Office Visit  Subjective:  Patient ID: Diane Berry, female    DOB: 1960/07/13  Age: 59 y.o. MRN: [id number]  CC:  Chief Complaint  Patient presents with  . Follow-up    hypertension    HPI [facility] interpretor for this visit [facility] Maxie Better presents for management of hypertension and hyperlipidemia. Denies shortness of breath, headaches, chest pain or lower extremity edema.  She voices no problems. Requesting medication refill she is out.  Past Medical History:  Diagnosis Date  . Stroke ([place]) 07/24/2017   intraventricular hemorrhage    Past Surgical History:  Procedure Laterality Date  . IR ANGIO INTRA EXTRACRAN SEL COM CAROTID INNOMINATE BILAT MOD SED  07/27/2017  . IR ANGIO VERTEBRAL SEL VERTEBRAL BILAT MOD SED  07/27/2017    Family History  Problem Relation Age of Onset  . Hypertension Sister     Social History   Socioeconomic History  . Marital status: Widowed    Spouse name: Not on file  . Number of children: Not on file  . Years of education: Not on file  . Highest education level: Not on file  Occupational History  . Not on file  Tobacco Use  . Smoking status: Never Smoker  . Smokeless tobacco: Never Used  Substance and Sexual Activity  . Alcohol use: No  . Drug use: No  . Sexual activity: Not Currently  Other Topics Concern  . Not on file  Social History Narrative  . Not on file   Social Determinants of Health   Financial Resource Strain:   . Difficulty of Paying Living Expenses: Not on file  Food Insecurity:   . Worried About Charity fundraiser in the Last Year: Not on file  . Ran Out of Food in the Last Year: Not on file  Transportation Needs:   . Lack of Transportation (Medical): Not on file  . Lack of Transportation (Non-Medical): Not on file  Physical Activity:   . Days of Exercise per Week: Not on file  . Minutes of Exercise per Session: Not on file  Stress:   . Feeling of Stress : Not on file   Social Connections:   . Frequency of Communication with Friends and Family: Not on file  . Frequency of Social Gatherings with Friends and Family: Not on file  . Attends Religious Services: Not on file  . Active Member of Clubs or Organizations: Not on file  . Attends Archivist Meetings: Not on file  . Marital Status: Not on file  Intimate Partner Violence:   . Fear of Current or Ex-Partner: Not on file  . Emotionally Abused: Not on file  . Physically Abused: Not on file  . Sexually Abused: Not on file    Outpatient Medications Prior to Visit  Medication Sig Dispense Refill  . potassium chloride (KLOR-CON) 10 MEQ tablet Take 1 tablet (10 mEq total) by mouth daily. 30 tablet 3  . amLODipine (NORVASC) 5 MG tablet Take 0.5 tablets (2.5 mg total) by mouth daily. 30 tablet 3  . atorvastatin (LIPITOR) 20 MG tablet Take 2 tablets (40 mg total) by mouth daily at 6 PM. 30 tablet 3  . hydrochlorothiazide (HYDRODIURIL) 25 MG tablet Take 1 tablet (25 mg total) by mouth daily. 30 tablet 3  . loratadine (CLARITIN) 10 MG tablet Take 1 tablet (10 mg total) by mouth daily. 30 tablet 11  . topiramate (TOPAMAX) 25 MG tablet Take 1 tablet (25  mg total) by mouth 2 (two) times daily. 30 tablet 3  . senna-docusate (SENOKOT-S) 8.6-50 MG tablet Take 1 tablet by mouth daily. (Patient not taking: Reported on 12/30/2018) 30 tablet 3   No facility-administered medications prior to visit.    No Known Allergies  ROS Review of Systems  HENT:       Watery eyes  All other systems reviewed and are negative.     Objective:    Physical Exam  Constitutional: She is oriented to person, place, and time. She appears well-developed.  Thin frail frame   HENT:  Head: Normocephalic.  Right Ear: External ear normal.  Left Ear: External ear normal.  Eyes: Pupils are equal, round, and reactive to light. EOM are normal.  Left eye gray haze over cornea - no problems with vision  Cardiovascular: Normal rate  and regular rhythm.  Pulmonary/Chest: Effort normal and breath sounds normal.  Abdominal: Soft. Bowel sounds are normal.  Musculoskeletal:        General: Normal range of motion.  Neurological: She is oriented to person, place, and time. She has normal reflexes.  Skin: Skin is warm and dry.  Psychiatric: She has a normal mood and affect. Her behavior is normal.    BP (!) 167/90 (BP Location: Right Arm, Patient Position: Sitting, Cuff Size: Normal)   Pulse 71   Temp (!) 95 F (35 C) (Temporal)   Ht 4' 9"  (1.448 m)   Wt 109 lb 6.4 oz (49.6 kg)   SpO2 99%   BMI 23.67 kg/m  Wt Readings from Last 3 Encounters:  07/17/19 109 lb 6.4 oz (49.6 kg)  04/17/19 105 lb 3.2 oz (47.7 kg)  12/30/18 106 lb 3.2 oz (48.2 kg)     Health Maintenance Due  Topic Date Due  . MAMMOGRAM  06/08/2008    There are no preventive care reminders to display for this patient.  Lab Results  Component Value Date   TSH 0.656 09/16/2017   Lab Results  Component Value Date   WBC 5.0 12/02/2018   HGB 10.3 (L) 12/02/2018   HCT 34.1 12/02/2018   MCV 75 (L) 12/02/2018   PLT 234 12/02/2018   Lab Results  Component Value Date   NA 142 04/17/2019   K 3.3 (L) 04/17/2019   CO2 21 04/17/2019   GLUCOSE 91 04/17/2019   BUN 21 04/17/2019   CREATININE 1.32 (H) 04/17/2019   BILITOT 0.5 04/17/2019   ALKPHOS 102 04/17/2019   AST 24 04/17/2019   ALT 9 04/17/2019   PROT 7.8 04/17/2019   ALBUMIN 4.6 04/17/2019   CALCIUM 9.6 04/17/2019   ANIONGAP 11 07/28/2017   Lab Results  Component Value Date   CHOL 106 04/17/2019   Lab Results  Component Value Date   HDL 48 04/17/2019   Lab Results  Component Value Date   LDLCALC 37 04/17/2019   Lab Results  Component Value Date   TRIG 114 04/17/2019   Lab Results  Component Value Date   CHOLHDL 2.2 04/17/2019   Lab Results  Component Value Date   HGBA1C 5.1 12/02/2018      Assessment & Plan:  Diane Berry was seen today for follow-up.  Diagnoses and all  orders for this visit:  Elevated lipids -     Cancel: Lipid Panel -     Lipid Panel  Hypertension, unspecified type  Blood pressure goal of less than/= 130/80, low-sodium, DASH diet, medication compliance, 150 minutes of moderate intensity exercise per week. Discussed medication  compliance, adverse effects. -     hydrochlorothiazide (HYDRODIURIL) 25 MG tablet; Take 1 tablet (25 mg total) by mouth daily. -     amLODipine (NORVASC) 5 MG tablet; Take 0.5 tablets (2.5 mg total) by mouth daily. -     atorvastatin (LIPITOR) 40 MG tablet; Take 1 tablet (40 mg total) by mouth daily at 6 PM. -     CBC with Differential -     CMP14+EGFR  Environmental allergies - Continue Claritin 61m daily this should help with watery eyes each day, as needed. - Drink at least 64 ounces of water each day. - If you have a humidifier use it nightly. - Remove as many irritants/allergies as you are able to, no pets in the bedroom, change air filters in air vents.  Hypokalemia  CMP when reevaluated will determine if potassium supplement is needed .  Potassium Content of Foods  The body needs potassium to control blood pressure and to keep the muscles and nervous system healthy. Here are some healthy foods below that are high in potassium. Also you can get the white label salt of "NO SALT" salt substitute, 1/4 teaspoon of this is equivalent to 23m potassium.   Meds ordered this encounter  Medications  . hydrochlorothiazide (HYDRODIURIL) 25 MG tablet    Sig: Take 1 tablet (25 mg total) by mouth daily.    Dispense:  90 tablet    Refill:  1  . amLODipine (NORVASC) 5 MG tablet    Sig: Take 0.5 tablets (2.5 mg total) by mouth daily.    Dispense:  90 tablet    Refill:  1  . loratadine (CLARITIN) 10 MG tablet    Sig: Take 1 tablet (10 mg total) by mouth daily.    Dispense:  90 tablet    Refill:  3  . atorvastatin (LIPITOR) 40 MG tablet    Sig: Take 1 tablet (40 mg total) by mouth daily at 6 PM.    Dispense:   90 tablet    Refill:  1  . Blood Pressure Monitor DEVI    Sig: 1 Bag by Does not apply route 3 (three) times daily.    Dispense:  1 each    Refill:  0    Follow-up: Return in about 5 weeks for Tele Blood pressure follow up.    MiKerin PernaNP

## 2019-07-18 LAB — LIPID PANEL
Chol/HDL Ratio: 2.7 ratio (ref 0.0–4.4)
Cholesterol, Total: 165 mg/dL (ref 100–199)
HDL: 61 mg/dL (ref >39)
LDL Chol Calc (NIH): 77 mg/dL (ref 0–99)
Triglycerides: 156 mg/dL — ABNORMAL HIGH (ref 0–149)
VLDL Cholesterol Cal: 27 mg/dL (ref 5–40)

## 2019-07-18 LAB — CBC WITH DIFFERENTIAL/PLATELET
Basophils Absolute: 0.1 10*3/uL (ref 0.0–0.2)
Basos: 1 %
EOS (ABSOLUTE): 0.7 10*3/uL — ABNORMAL HIGH (ref 0.0–0.4)
Eos: 11 %
Hematocrit: 34.8 % (ref 34.0–46.6)
Hemoglobin: 10.9 g/dL — ABNORMAL LOW (ref 11.1–15.9)
Immature Grans (Abs): 0 10*3/uL (ref 0.0–0.1)
Immature Granulocytes: 0 %
Lymphocytes Absolute: 2 10*3/uL (ref 0.7–3.1)
Lymphs: 29 %
MCH: 23 pg — ABNORMAL LOW (ref 26.6–33.0)
MCHC: 31.3 g/dL — ABNORMAL LOW (ref 31.5–35.7)
MCV: 73 fL — ABNORMAL LOW (ref 79–97)
Monocytes Absolute: 0.4 10*3/uL (ref 0.1–0.9)
Monocytes: 6 %
Neutrophils Absolute: 3.6 10*3/uL (ref 1.4–7.0)
Neutrophils: 53 %
Platelets: 234 10*3/uL (ref 150–450)
RBC: 4.74 x10E6/uL (ref 3.77–5.28)
RDW: 16.6 % — ABNORMAL HIGH (ref 11.7–15.4)
WBC: 6.8 10*3/uL (ref 3.4–10.8)

## 2019-07-18 LAB — CMP14+EGFR
ALT: 20 IU/L (ref 0–32)
AST: 35 IU/L (ref 0–40)
Albumin/Globulin Ratio: 1.5 (ref 1.2–2.2)
Albumin: 4.9 g/dL — ABNORMAL HIGH (ref 3.8–4.8)
Alkaline Phosphatase: 108 IU/L (ref 39–117)
BUN/Creatinine Ratio: 22 (ref 12–28)
BUN: 21 mg/dL (ref 8–27)
Bilirubin Total: 0.6 mg/dL (ref 0.0–1.2)
CO2: 17 mmol/L — ABNORMAL LOW (ref 20–29)
Calcium: 10.1 mg/dL (ref 8.7–10.3)
Chloride: 111 mmol/L — ABNORMAL HIGH (ref 96–106)
Creatinine, Ser: 0.97 mg/dL (ref 0.57–1.00)
GFR calc Af Amer: 73 mL/min/{1.73_m2} (ref >59)
GFR calc non Af Amer: 63 mL/min/{1.73_m2} (ref >59)
Globulin, Total: 3.2 g/dL (ref 1.5–4.5)
Glucose: 95 mg/dL (ref 65–99)
Potassium: 4.5 mmol/L (ref 3.5–5.2)
Sodium: 144 mmol/L (ref 134–144)
Total Protein: 8.1 g/dL (ref 6.0–8.5)

## 2019-07-19 ENCOUNTER — Other Ambulatory Visit (INDEPENDENT_AMBULATORY_CARE_PROVIDER_SITE_OTHER): Payer: Self-pay | Admitting: Primary Care

## 2019-07-19 DIAGNOSIS — I10 Essential (primary) hypertension: Secondary | ICD-10-CM

## 2019-07-19 MED ORDER — TAB-A-VITE/IRON PO TABS: 1.0000 | ORAL_TABLET | Freq: Every day | ORAL | 3 refills | Status: DC

## 2019-07-19 MED ORDER — ATORVASTATIN CALCIUM 20 MG PO TABS: 20.0000 mg | ORAL_TABLET | Freq: Every day | ORAL | 1 refills | Status: DC

## 2019-07-19 MED FILL — ATORVASTATIN CALCIUM 20 MG: 20 | 30 days supply | Qty: 30 | Fill #0

## 2019-08-04 ENCOUNTER — Encounter (INDEPENDENT_AMBULATORY_CARE_PROVIDER_SITE_OTHER): Payer: Self-pay

## 2019-08-10 ENCOUNTER — Other Ambulatory Visit (INDEPENDENT_AMBULATORY_CARE_PROVIDER_SITE_OTHER): Payer: Self-pay | Admitting: Primary Care

## 2019-08-10 DIAGNOSIS — Z760 Encounter for issue of repeat prescription: Secondary | ICD-10-CM

## 2019-08-11 MED FILL — POTASSIUM CHLORIDE ER 10 ME: 10 | 30 days supply | Qty: 30 | Fill #3

## 2019-08-11 MED FILL — ATORVASTATIN CALCIUM 40 MG: 40 | 30 days supply | Qty: 30 | Fill #1

## 2019-08-11 MED FILL — HYDROCHLOROTHIAZIDE 25 MG T: 25 | 30 days supply | Qty: 30 | Fill #1

## 2019-08-11 MED FILL — AMLODIPINE BESYLATE 5 MG TA: 5 | 30 days supply | Qty: 15 | Fill #1

## 2019-08-21 ENCOUNTER — Ambulatory Visit (INDEPENDENT_AMBULATORY_CARE_PROVIDER_SITE_OTHER): Payer: No Typology Code available for payment source | Admitting: Primary Care

## 2019-08-21 ENCOUNTER — Encounter (INDEPENDENT_AMBULATORY_CARE_PROVIDER_SITE_OTHER): Payer: Self-pay | Admitting: Primary Care

## 2019-08-21 ENCOUNTER — Other Ambulatory Visit: Payer: Self-pay

## 2019-08-21 ENCOUNTER — Ambulatory Visit: Payer: No Typology Code available for payment source | Attending: Primary Care

## 2019-08-21 DIAGNOSIS — R519 Headache, unspecified: Secondary | ICD-10-CM

## 2019-08-21 DIAGNOSIS — Z9109 Other allergy status, other than to drugs and biological substances: Secondary | ICD-10-CM | POA: Diagnosis not present

## 2019-08-21 DIAGNOSIS — Z0130 Encounter for examination of blood pressure without abnormal findings: Secondary | ICD-10-CM

## 2019-08-21 NOTE — Progress Notes (Signed)
Virtual Visit via Telephone Note  I connected with Diane Berry on 08/21/19 at  9:50 AM EDT by telephone and verified that I am speaking with the correct person using two identifiers.   I discussed the limitations, risks, security and privacy concerns of performing an evaluation and management service by telephone and the availability of in person appointments. I also discussed with the patient that there may be a patient responsible charge related to this service. The patient expressed understanding and agreed to proceed.   History of Present Illness: Diane Berry is having a telemetry for blood pressure follow-up.  Systolic blood pressure readings have been from 673-419 and diastolic readings have been 74-80.Denies shortness of breath,  chest pain or lower extremity edema. She does have headaches but her daughter feels it is related to when her stomach is burning.    Past Medical History:  Diagnosis Date  . Stroke ([place]) 07/24/2017   intraventricular hemorrhage   Current Outpatient Medications on File Prior to Visit  Medication Sig Dispense Refill  . amLODipine (NORVASC) 5 MG tablet Take 0.5 tablets (2.5 mg total) by mouth daily. 90 tablet 1  . atorvastatin (LIPITOR) 20 MG tablet Take 1 tablet (20 mg total) by mouth daily at 6 PM. 90 tablet 1  . Blood Pressure Monitor DEVI 1 Bag by Does not apply route 3 (three) times daily. 1 each 0  . hydrochlorothiazide (HYDRODIURIL) 25 MG tablet Take 1 tablet (25 mg total) by mouth daily. 90 tablet 1  . loratadine (CLARITIN) 10 MG tablet Take 1 tablet (10 mg total) by mouth daily. 90 tablet 3  . Multiple Vitamins-Iron (MULTIVITAMINS WITH IRON) TABS tablet Take 1 tablet by mouth daily. 90 tablet 3  . senna-docusate (SENOKOT-S) 8.6-50 MG tablet Take 1 tablet by mouth daily. (Patient not taking: Reported on 08/21/2019) 30 tablet 3   No current facility-administered medications on file prior to visit.   Observations/Objective: Review of Systems   Neurological: Positive for headaches.       Left side   Endo/Heme/Allergies: Positive for environmental allergies.  All other systems reviewed and are negative.  Assessment and Plan: Diane Berry was seen today for blood pressure check.  Diagnoses and all orders for this visit:  Blood pressure check Continue all antihypertensives as prescribed.  Remember to bring in your blood pressure log with you for your follow up appointment.  DASH/Mediterranean Diets are healthier choices for HTN.    Generalized headaches She may take tylenol 500 mg of ibuprofen 400 mg for headaches if not relieved. If worsening changes or changes in vision/speech, imbalance, weakness go to the ER  Environmental allergies  Use Flonase nasal spray for at least duration of your allergy season.  - For appropriate administration of the nasal spray, clear the nose, use opposite hand for opposite nare, sniff gently, exhale through your mouth. - For maximal effect take these two nasal sprays at least 30 minutes apart. - Continue Allegra, Claritin or Zyrtec each day, as needed. - Drink at least 64 ounces of water each day. - If you have a humidifier use it nightly. - Remove as many irritants/allergies as you are able to, no pets in the bedroom, change air filters in air vents.    Follow Up Instructions:    I discussed the assessment and treatment plan with the patient. The patient was provided an opportunity to ask questions and all were answered. The patient agreed with the plan and demonstrated an understanding of the instructions.   The  patient was advised to call back or seek an in-person evaluation symptoms worsen or if the condition fails to improve as anticipated.  I provided 12 minutes of non-face-to-face time during this encounter.   Grayce Sessions, NP

## 2019-08-21 NOTE — Progress Notes (Signed)
Bp at 10:02 am 125/74 Pt has taken medication

## 2019-09-18 ENCOUNTER — Other Ambulatory Visit (INDEPENDENT_AMBULATORY_CARE_PROVIDER_SITE_OTHER): Payer: Self-pay | Admitting: Primary Care

## 2019-09-18 DIAGNOSIS — E876 Hypokalemia: Secondary | ICD-10-CM

## 2019-09-18 MED FILL — ATORVASTATIN CALCIUM 40 MG: 40 | 30 days supply | Qty: 30 | Fill #2

## 2019-09-18 MED FILL — AMLODIPINE BESYLATE 5 MG TA: 5 | 30 days supply | Qty: 15 | Fill #2

## 2019-09-18 MED FILL — HYDROCHLOROTHIAZIDE 25 MG T: 25 | 30 days supply | Qty: 30 | Fill #2

## 2019-09-18 NOTE — Telephone Encounter (Signed)
Sent to PCP ?

## 2019-09-19 MED FILL — POTASSIUM CHLORIDE ER 10 ME: 10 | 30 days supply | Qty: 30 | Fill #0

## 2020-01-22 ENCOUNTER — Ambulatory Visit (INDEPENDENT_AMBULATORY_CARE_PROVIDER_SITE_OTHER): Payer: No Typology Code available for payment source | Admitting: Primary Care

## 2020-01-22 ENCOUNTER — Other Ambulatory Visit: Payer: Self-pay | Admitting: Primary Care

## 2020-01-22 ENCOUNTER — Encounter (INDEPENDENT_AMBULATORY_CARE_PROVIDER_SITE_OTHER): Payer: Self-pay | Admitting: Primary Care

## 2020-01-22 ENCOUNTER — Other Ambulatory Visit: Payer: Self-pay

## 2020-01-22 VITALS — BP 181/92 | HR 68 | Temp 98.1°F | Ht <= 58 in | Wt 113.4 lb

## 2020-01-22 DIAGNOSIS — N189 Chronic kidney disease, unspecified: Secondary | ICD-10-CM | POA: Diagnosis not present

## 2020-01-22 DIAGNOSIS — Z1231 Encounter for screening mammogram for malignant neoplasm of breast: Secondary | ICD-10-CM | POA: Diagnosis not present

## 2020-01-22 DIAGNOSIS — R1013 Epigastric pain: Secondary | ICD-10-CM

## 2020-01-22 DIAGNOSIS — I10 Essential (primary) hypertension: Secondary | ICD-10-CM | POA: Diagnosis not present

## 2020-01-22 DIAGNOSIS — G8929 Other chronic pain: Secondary | ICD-10-CM

## 2020-01-22 MED ORDER — HYDROCHLOROTHIAZIDE 25 MG PO TABS: 25.0000 mg | ORAL_TABLET | Freq: Every day | ORAL | 1 refills | Status: DC

## 2020-01-22 MED ORDER — AMLODIPINE BESYLATE 10 MG PO TABS: 10.0000 mg | ORAL_TABLET | Freq: Every day | ORAL | 1 refills | Status: DC

## 2020-01-22 MED FILL — HYDROCHLOROTHIAZIDE 25 MG T: 25 | 30 days supply | Qty: 30 | Fill #0

## 2020-01-22 MED FILL — AMLODIPINE BESYLATE 10 MG T: 10 | 30 days supply | Qty: 30 | Fill #0

## 2020-01-22 NOTE — Progress Notes (Signed)
Established Patient Office Visit  Subjective:  Patient ID: Diane Berry, female    DOB: May 14, 1961  Age: 59 y.o. MRN: [id number]  CC:  Chief Complaint  Patient presents with  . Blood Pressure Check  . Medication Refill    HPI Diane Berry is a 59 year old female that speaks [place] Mike Gip speaks)  presents for blood pressure follow up interpretor Benjaman Kindler present.  She has been out of Bp medication for 1 week and has not taken any. After this appointment her daughter is going to pick it up   Past Medical History:  Diagnosis Date  . Stroke ([place]) 07/24/2017   intraventricular hemorrhage    Past Surgical History:  Procedure Laterality Date  . IR ANGIO INTRA EXTRACRAN SEL COM CAROTID INNOMINATE BILAT MOD SED  07/27/2017  . IR ANGIO VERTEBRAL SEL VERTEBRAL BILAT MOD SED  07/27/2017    Family History  Problem Relation Age of Onset  . Hypertension Sister     Social History   Socioeconomic History  . Marital status: Widowed    Spouse name: Not on file  . Number of children: Not on file  . Years of education: Not on file  . Highest education level: Not on file  Occupational History  . Not on file  Tobacco Use  . Smoking status: Never Smoker  . Smokeless tobacco: Never Used  Substance and Sexual Activity  . Alcohol use: No  . Drug use: No  . Sexual activity: Not Currently  Other Topics Concern  . Not on file  Social History Narrative  . Not on file   Social Determinants of Health   Financial Resource Strain:   . Difficulty of Paying Living Expenses:   Food Insecurity:   . Worried About Charity fundraiser in the Last Year:   . Arboriculturist in the Last Year:   Transportation Needs:   . Film/video editor (Medical):   Marland Kitchen Lack of Transportation (Non-Medical):   Physical Activity:   . Days of Exercise per Week:   . Minutes of Exercise per Session:   Stress:   . Feeling of Stress :   Social Connections:   . Frequency of Communication with Friends  and Family:   . Frequency of Social Gatherings with Friends and Family:   . Attends Religious Services:   . Active Member of Clubs or Organizations:   . Attends Archivist Meetings:   Marland Kitchen Marital Status:   Intimate Partner Violence:   . Fear of Current or Ex-Partner:   . Emotionally Abused:   Marland Kitchen Physically Abused:   . Sexually Abused:     Outpatient Medications Prior to Visit  Medication Sig Dispense Refill  . atorvastatin (LIPITOR) 20 MG tablet Take 1 tablet (20 mg total) by mouth daily at 6 PM. 90 tablet 1  . Blood Pressure Monitor DEVI 1 Bag by Does not apply route 3 (three) times daily. 1 each 0  . loratadine (CLARITIN) 10 MG tablet Take 1 tablet (10 mg total) by mouth daily. 90 tablet 3  . Multiple Vitamins-Iron (MULTIVITAMINS WITH IRON) TABS tablet Take 1 tablet by mouth daily. 90 tablet 3  . potassium chloride (KLOR-CON) 10 MEQ tablet TAKE 1 TABLET (10 MEQ TOTAL) BY MOUTH DAILY. 30 tablet 3  . senna-docusate (SENOKOT-S) 8.6-50 MG tablet Take 1 tablet by mouth daily. 30 tablet 3  . amLODipine (NORVASC) 5 MG tablet Take 0.5 tablets (2.5 mg total) by mouth daily. 90 tablet  1  . hydrochlorothiazide (HYDRODIURIL) 25 MG tablet Take 1 tablet (25 mg total) by mouth daily. 90 tablet 1   No facility-administered medications prior to visit.    No Known Allergies  ROS Review of Systems  All other systems reviewed and are negative.     Objective:    Physical Exam Vitals reviewed.  HENT:     Head: Normocephalic.  Cardiovascular:     Rate and Rhythm: Normal rate and regular rhythm.     Pulses: Normal pulses.     Heart sounds: Normal heart sounds.  Pulmonary:     Effort: Pulmonary effort is normal.     Breath sounds: Normal breath sounds.  Abdominal:     General: Bowel sounds are normal.  Musculoskeletal:        General: Normal range of motion.     Cervical back: Normal range of motion.  Skin:    General: Skin is warm and dry.  Neurological:     Mental Status:  She is alert and oriented to person, place, and time.  Psychiatric:        Mood and Affect: Mood normal.        Behavior: Behavior normal.     BP (!) 181/92 (BP Location: Right Arm, Patient Position: Sitting, Cuff Size: Normal)   Pulse 68   Temp 98.1 F (36.7 C) (Oral)   Ht _0  (1.448 m)   Wt 113 lb 6.4 oz (51.4 kg)   SpO2 99%   BMI 24.54 kg/m  Wt Readings from Last 3 Encounters:  01/22/20 113 lb 6.4 oz (51.4 kg)  07/17/19 109 lb 6.4 oz (49.6 kg)  04/17/19 105 lb 3.2 oz (47.7 kg)     Health Maintenance Due  Topic Date Due  . MAMMOGRAM  Never done  . INFLUENZA VACCINE  01/07/2020    There are no preventive care reminders to display for this patient.  Lab Results  Component Value Date   TSH 0.656 09/16/2017   Lab Results  Component Value Date   WBC 6.8 07/17/2019   HGB 10.9 (L) 07/17/2019   HCT 34.8 07/17/2019   MCV 73 (L) 07/17/2019   PLT 234 07/17/2019   Lab Results  Component Value Date   NA 144 07/17/2019   K 4.5 07/17/2019   CO2 17 (L) 07/17/2019   GLUCOSE 95 07/17/2019   BUN 21 07/17/2019   CREATININE 0.97 07/17/2019   BILITOT 0.6 07/17/2019   ALKPHOS 108 07/17/2019   AST 35 07/17/2019   ALT 20 07/17/2019   PROT 8.1 07/17/2019   ALBUMIN 4.9 (H) 07/17/2019   CALCIUM 10.1 07/17/2019   ANIONGAP 11 07/28/2017   Lab Results  Component Value Date   CHOL 165 07/17/2019   Lab Results  Component Value Date   HDL 61 07/17/2019   Lab Results  Component Value Date   LDLCALC 77 07/17/2019   Lab Results  Component Value Date   TRIG 156 (H) 07/17/2019   Lab Results  Component Value Date   CHOLHDL 2.7 07/17/2019   Lab Results  Component Value Date   HGBA1C 5.1 12/02/2018      Assessment & Plan:  Magdalynn was seen today for blood pressure check and medication refill.  Diagnoses and all orders for this visit:  Encounter for screening mammogram for malignant neoplasm of breast Referred for mammogram screening   Hypertension, unspecified  type Out of medication return on medication. . We discussed the importance of compliance with medical therapy and DASH  diet recommended, consequences of uncontrolled hypertension discussed.  - continue current BP medications -     hydrochlorothiazide (HYDRODIURIL) 25 MG tablet; Take 1 tablet (25 mg total) by mouth daily. -     amLODipine (NORVASC) 10 MG tablet; Take 1 tablet (10 mg total) by mouth daily.  Chronic kidney disease, unspecified CKD stage -     CMP14+EGFR  Abdominal pain, chronic, epigastric -     Ambulatory referral to Gastroenterology   Follow-up: Return in about 22 days (around 02/13/2020), or in person Bp follow up.    Kerin Perna, NP

## 2020-01-22 NOTE — Patient Instructions (Signed)

## 2020-02-13 ENCOUNTER — Ambulatory Visit (INDEPENDENT_AMBULATORY_CARE_PROVIDER_SITE_OTHER): Payer: No Typology Code available for payment source | Admitting: Primary Care

## 2020-02-21 MED FILL — HYDROCHLOROTHIAZIDE 25 MG T: 25 | 30 days supply | Qty: 30 | Fill #1

## 2020-02-21 MED FILL — AMLODIPINE BESYLATE 10 MG T: 10 | 30 days supply | Qty: 30 | Fill #1

## 2020-03-25 MED FILL — HYDROCHLOROTHIAZIDE 25 MG T: 25 | 30 days supply | Qty: 30 | Fill #2

## 2020-03-25 MED FILL — AMLODIPINE BESYLATE 10 MG T: 10 | 30 days supply | Qty: 30 | Fill #2

## 2020-04-23 MED FILL — AMLODIPINE BESYLATE 10 MG T: 10 | 30 days supply | Qty: 30 | Fill #3

## 2020-04-23 MED FILL — HYDROCHLOROTHIAZIDE 25 MG T: 25 | 30 days supply | Qty: 30 | Fill #3

## 2020-06-04 MED FILL — HYDROCHLOROTHIAZIDE 25 MG T: 25 | 30 days supply | Qty: 30 | Fill #4

## 2020-06-04 MED FILL — AMLODIPINE BESYLATE 10 MG T: 10 | 30 days supply | Qty: 30 | Fill #4

## 2020-07-09 MED FILL — AMLODIPINE BESYLATE 10 MG T: 10 | 30 days supply | Qty: 30 | Fill #5

## 2020-07-09 MED FILL — HYDROCHLOROTHIAZIDE 25 MG T: 25 | 30 days supply | Qty: 30 | Fill #5

## 2020-09-24 ENCOUNTER — Other Ambulatory Visit (INDEPENDENT_AMBULATORY_CARE_PROVIDER_SITE_OTHER): Payer: Self-pay | Admitting: Primary Care

## 2020-09-24 ENCOUNTER — Other Ambulatory Visit: Payer: Self-pay

## 2020-09-24 NOTE — Telephone Encounter (Signed)
Requested medication (s) are due for refill today: no  Requested medication (s) are on the active medication list: yes   Last refill: 07/09/2020  Future visit scheduled:  no  Notes to clinic: overdue for follow up and labs    Requested Prescriptions  Pending Prescriptions Disp Refills   hydrochlorothiazide (HYDRODIURIL) 25 MG tablet 90 tablet 1    Sig: TAKE 1 TABLET (25 MG TOTAL) BY MOUTH DAILY.      Cardiovascular: Diuretics - Thiazide Failed - 09/24/2020 12:35 PM      Failed - Ca in normal range and within 360 days    Calcium  Date Value Ref Range Status  07/17/2019 10.1 8.7 - 10.3 mg/dL Final   Calcium, Ion  Date Value Ref Range Status  09/16/2017 5.2 4.5 - 5.6 mg/dL Final          Failed - Cr in normal range and within 360 days    Creatinine, Ser  Date Value Ref Range Status  07/17/2019 0.97 0.57 - 1.00 mg/dL Final          Failed - K in normal range and within 360 days    Potassium  Date Value Ref Range Status  07/17/2019 4.5 3.5 - 5.2 mmol/L Final          Failed - Na in normal range and within 360 days    Sodium  Date Value Ref Range Status  07/17/2019 144 134 - 144 mmol/L Final          Failed - Last BP in normal range    BP Readings from Last 1 Encounters:  01/22/20 (!) 181/92          Failed - Valid encounter within last 6 months    Recent Outpatient Visits           8 months ago Encounter for screening mammogram for malignant neoplasm of breast   [facility] Grayce Sessions, NP   1 year ago Blood pressure check   [facility] Grayce Sessions, NP   1 year ago Elevated lipids   [facility] Grayce Sessions, NP   1 year ago Essential hypertension   [facility] Grayce Sessions, NP   1 year ago Hypertension, unspecified type   [facility] Gwinda Passe P, NP                  amLODipine (NORVASC) 10 MG  tablet 90 tablet 1    Sig: TAKE 1 TABLET (10 MG TOTAL) BY MOUTH DAILY.      Cardiovascular:  Calcium Channel Blockers Failed - 09/24/2020 12:35 PM      Failed - Last BP in normal range    BP Readings from Last 1 Encounters:  01/22/20 (!) 181/92          Failed - Valid encounter within last 6 months    Recent Outpatient Visits           8 months ago Encounter for screening mammogram for malignant neoplasm of breast   [facility] Grayce Sessions, NP   1 year ago Blood pressure check   [facility] Grayce Sessions, NP   1 year ago Elevated lipids   [facility] Grayce Sessions, NP   1 year ago Essential hypertension   [facility] Grayce Sessions, NP  1 year ago Hypertension, unspecified type   [facility] Grayce Sessions, NP

## 2020-09-25 ENCOUNTER — Other Ambulatory Visit: Payer: Self-pay

## 2020-09-25 MED ORDER — HYDROCHLOROTHIAZIDE 25 MG PO TABS
25.0000 mg | ORAL_TABLET | Freq: Every day | ORAL | 0 refills | Status: DC
  Filled 2020-09-25: qty 30, 30d supply, fill #0

## 2020-09-25 MED ORDER — AMLODIPINE BESYLATE 10 MG PO TABS
10.0000 mg | ORAL_TABLET | Freq: Every day | ORAL | 0 refills | Status: DC
  Filled 2020-09-25: qty 30, 30d supply, fill #0

## 2020-09-26 ENCOUNTER — Other Ambulatory Visit: Payer: Self-pay

## 2020-10-01 ENCOUNTER — Other Ambulatory Visit: Payer: Self-pay

## 2021-10-27 ENCOUNTER — Ambulatory Visit (HOSPITAL_COMMUNITY)
Admission: EM | Admit: 2021-10-27 | Discharge: 2021-10-27 | Disposition: A | Discharge status: Home / Self Care | Payer: No Typology Code available for payment source | Attending: Internal Medicine | Admitting: Internal Medicine

## 2021-10-27 ENCOUNTER — Encounter (HOSPITAL_COMMUNITY): Payer: Self-pay

## 2021-10-27 ENCOUNTER — Other Ambulatory Visit: Payer: Self-pay

## 2021-10-27 DIAGNOSIS — I10 Essential (primary) hypertension: Secondary | ICD-10-CM

## 2021-10-27 DIAGNOSIS — R519 Headache, unspecified: Secondary | ICD-10-CM

## 2021-10-27 DIAGNOSIS — K219 Gastro-esophageal reflux disease without esophagitis: Secondary | ICD-10-CM

## 2021-10-27 LAB — POCT URINALYSIS DIPSTICK, ED / UC
Bilirubin Urine: NEGATIVE
Glucose, UA: NEGATIVE mg/dL
Ketones, ur: NEGATIVE mg/dL
Leukocytes,Ua: NEGATIVE
Nitrite: NEGATIVE
Protein, ur: 100 mg/dL — AB
Specific Gravity, Urine: 1.015 (ref 1.005–1.030)
Urobilinogen, UA: 0.2 mg/dL (ref 0.0–1.0)
pH: 5 (ref 5.0–8.0)

## 2021-10-27 LAB — CBG MONITORING, ED: Glucose-Capillary: 104 mg/dL — ABNORMAL HIGH (ref 70–99)

## 2021-10-27 MED ORDER — FAMOTIDINE 20 MG PO TABS
20.0000 mg | ORAL_TABLET | Freq: Two times a day (BID) | ORAL | 0 refills | Status: DC
  Filled 2021-10-27: qty 30, 15d supply, fill #0

## 2021-10-27 MED ORDER — ACETAMINOPHEN 325 MG PO TABS
975.0000 mg | ORAL_TABLET | Freq: Once | ORAL | Status: AC
  Administered 2021-10-27: 975 mg via ORAL

## 2021-10-27 MED ORDER — ACETAMINOPHEN 325 MG PO TABS
ORAL_TABLET | ORAL | Status: AC
  Filled 2021-10-27: qty 3

## 2021-10-27 NOTE — ED Notes (Signed)
Patient is being discharged from the Urgent Care and sent to the Emergency Department via POV . Per Marcelino Duster NP, patient is in need of higher level of care due to elevated BP and vision changes. Patient is aware and verbalizes understanding of plan of care.  Vitals:   10/27/21 1058  BP: (!) 201/107  Pulse: 80  Resp: 16  Temp: 97.8 F (36.6 C)  SpO2: 99%

## 2021-10-27 NOTE — Discharge Instructions (Addendum)
Your urine was negative for infection today.  We gave you Tylenol in the clinic today for your headache.  Your blood pressure continues to be very elevated and I am very concerned that this is caused damage to your body.  I would like for you to go to the emergency room immediately to be checked out to make sure that you do not have any significant kidney damage or brain damage due to your elevated blood pressure, headache, and double vision.   Recommend you to go [facility] Emergency Department immediately for further evaluation.   I have prescribed famotidine for you to take twice daily 30 minutes before meals for gastroesophageal reflux disease (acid reflux).  Avoid spicy, fatty, and citrus-based foods as these can all be triggers for acid reflux.  Decrease the amount of salt in your diet.

## 2021-10-27 NOTE — ED Provider Notes (Signed)
[facility]    CSN: [id number] Arrival date & time: 10/27/21  6712      History   Chief Complaint No chief complaint on file.   HPI Sonal Shellenbarger is a 61 y.o. female.   Patient presents to urgent care for evaluation of right eye pain for 2 months and headache, dizziness, and double vision that started yesterday. States that she has had blurry vision and a "growth" to her right eyeball for the last 2 months that has gotten worse. Currently complaining of a headache that is a 10 on a scale of 0-10 that is generalized and worse at the crown of her head. Patient feels "drunk" when she walks and had to go lay down. Dizziness gets worse with head movement. Patient was seen and treated for hypertension 1 year ago, but patient's daughter stopped giving her blood pressure medication because her mother has been reporting acid reflux and upset stomach. Daughter checks patient's BP at home and states it has been "normal" at 120s-130s/80s-90s for the past year until a couple of days ago.  Patient also cooks at home with a lot of salt and eats red meat frequently. No nausea, vomiting, or diarrhea. No fever, rash, shortness of breath, chest pain, or back pain. Patient has a history of GERD and has previously taken medication for this, but states that nothing helped. Reports frequent belching and epigastric abdominal pain. Also reports frequent urination, but denies urgency and dysuria. Denies recent antibiotic use and recent changes to medications. No falls/trauma recently.  Headache yesterday responded well to Tylenol 500 mg at home.  Denies any other aggravating or relieving factors at this time.     Past Medical History:  Diagnosis Date   Stroke (HCC) 07/24/2017   intraventricular hemorrhage    Patient Active Problem List   Diagnosis Date Noted   Hypertension 12/02/2018   GERD (gastroesophageal reflux disease) 12/02/2018   SIRS (systemic inflammatory response syndrome) (HCC) 07/24/2017    Elevated blood pressure reading 07/24/2017   ICH (intracerebral hemorrhage) (HCC) 07/24/2017    Past Surgical History:  Procedure Laterality Date   IR ANGIO INTRA EXTRACRAN SEL COM CAROTID INNOMINATE BILAT MOD SED  07/27/2017   IR ANGIO VERTEBRAL SEL VERTEBRAL BILAT MOD SED  07/27/2017    OB History   No obstetric history on file.      Home Medications    Prior to Admission medications   Medication Sig Start Date End Date Taking? Authorizing Provider  famotidine (PEPCID) 20 MG tablet Take 1 tablet (20 mg total) by mouth 2 (two) times daily. 10/27/21  Yes Carlisle Beers, FNP  atorvastatin (LIPITOR) 20 MG tablet Take 1 tablet (20 mg total) by mouth daily at 6 PM. 07/19/19   Grayce Sessions, NP  Blood Pressure Monitor DEVI 1 Bag by Does not apply route 3 (three) times daily. 07/17/19   Grayce Sessions, NP  loratadine (CLARITIN) 10 MG tablet Take 1 tablet (10 mg total) by mouth daily. 07/17/19   Grayce Sessions, NP  Multiple Vitamins-Iron (MULTIVITAMINS WITH IRON) TABS tablet Take 1 tablet by mouth daily. 07/19/19   Grayce Sessions, NP    Family History Family History  Problem Relation Age of Onset   Hypertension Sister     Social History Social History   Tobacco Use   Smoking status: Never   Smokeless tobacco: Never  Substance Use Topics   Alcohol use: No   Drug use: No     Allergies  Patient has no known allergies.   Review of Systems Review of Systems Per HPI  Physical Exam Triage Vital Signs ED Triage Vitals [10/27/21 1058]  Enc Vitals Group     BP (!) 201/107     Pulse Rate 80     Resp 16     Temp 97.8 F (36.6 C)     Temp Source Oral     SpO2 99 %     Weight      Height      Head Circumference      Peak Flow      Pain Score      Pain Loc      Pain Edu?      Excl. in GC?    No data found.  Updated Vital Signs BP (!) 201/107 (BP Location: Left Arm)   Pulse 80   Temp 97.8 F (36.6 C) (Oral)   Resp 16   SpO2 99%    Visual Acuity Right Eye Distance:   Left Eye Distance:   Bilateral Distance:    Right Eye Near:   Left Eye Near:    Bilateral Near:     Physical Exam Vitals and nursing note reviewed.  Constitutional:      General: She is not in acute distress.    Appearance: Normal appearance. She is well-developed and normal weight. She is not ill-appearing.  HENT:     Head: Normocephalic and atraumatic.     Jaw: There is normal jaw occlusion.     Right Ear: Hearing, tympanic membrane, ear canal and external ear normal.     Left Ear: Hearing, tympanic membrane, ear canal and external ear normal.     Nose: Nose normal. No congestion or rhinorrhea.     Right Turbinates: Not swollen or pale.     Left Turbinates: Not swollen or pale.     Right Sinus: No maxillary sinus tenderness or frontal sinus tenderness.     Left Sinus: No maxillary sinus tenderness or frontal sinus tenderness.     Mouth/Throat:     Lips: Pink.     Mouth: Mucous membranes are moist.     Pharynx: Oropharynx is clear. Uvula midline. No posterior oropharyngeal erythema.  Eyes:     General: Lids are normal. Vision grossly intact. Gaze aligned appropriately. No visual field deficit.       Right eye: No discharge or hordeolum.        Left eye: No discharge or hordeolum.     Extraocular Movements: Extraocular movements intact.     Conjunctiva/sclera: Conjunctivae normal.     Pupils: Pupils are equal, round, and reactive to light.     Comments: Pterygium noted to patient's right eye. Cataract noted to patient's left eye to approximately 10-11 o'clock position.   Cardiovascular:     Rate and Rhythm: Normal rate and regular rhythm.     Heart sounds: Normal heart sounds, S1 normal and S2 normal. No murmur heard.   No friction rub. No gallop.  Pulmonary:     Effort: Pulmonary effort is normal. No respiratory distress.     Breath sounds: Normal breath sounds. No decreased breath sounds, wheezing, rhonchi or rales.     Comments:  Lung sounds clear and equal throughout lung fields bilaterally.  No adventitious sounds heard. Chest:     Chest wall: No tenderness.  Abdominal:     General: Abdomen is flat. Bowel sounds are normal.     Palpations: Abdomen is soft.  Tenderness: There is abdominal tenderness in the epigastric area. There is no right CVA tenderness, left CVA tenderness or guarding. Negative signs include Murphy's sign and McBurney's sign.  Musculoskeletal:        General: No swelling.     Cervical back: Normal range of motion and neck supple.     Right lower leg: No edema.     Left lower leg: No edema.  Lymphadenopathy:     Cervical: No cervical adenopathy.  Skin:    General: Skin is warm and dry.     Capillary Refill: Capillary refill takes less than 2 seconds.     Findings: No rash.  Neurological:     General: No focal deficit present.     Mental Status: She is alert and oriented to person, place, and time.     Cranial Nerves: Cranial nerves 2-12 are intact. No cranial nerve deficit, dysarthria or facial asymmetry.     Sensory: Sensation is intact.     Motor: Motor function is intact. No weakness or pronator drift.     Coordination: Coordination is intact. Coordination normal. Finger-Nose-Finger Test normal.     Gait: Gait is intact. Gait normal.     Comments: Patient ambulates with a steady gait.  No acute neurologic abnormality identified with neuro exam.  Psychiatric:        Mood and Affect: Mood normal.        Behavior: Behavior normal.        Thought Content: Thought content normal.        Judgment: Judgment normal.     UC Treatments / Results  Labs (all labs ordered are listed, but only abnormal results are displayed) Labs Reviewed  POCT URINALYSIS DIPSTICK, ED / UC - Abnormal; Notable for the following components:      Result Value   Hgb urine dipstick TRACE (*)    Protein, ur 100 (*)    All other components within normal limits  CBG MONITORING, ED - Abnormal; Notable for the  following components:   Glucose-Capillary 104 (*)    All other components within normal limits    EKG   Radiology No results found.  Procedures Procedures (including critical care time)  Medications Ordered in UC Medications  acetaminophen (TYLENOL) tablet 975 mg (975 mg Oral Given 10/27/21 1218)    Initial Impression / Assessment and Plan / UC Course  I have reviewed the triage vital signs and the nursing notes.  Pertinent labs & imaging results that were available during my care of the patient were reviewed by me and considered in my medical decision making (see chart for details).  Patient is a 61 year old female with significant medical history of intracranial hemorrhage in 2019 and hypertension presenting to urgent care with elevated blood pressure, headache, and double vision.  Pterygium noted to physical exam of right eye with hazy lens likely due to cataract of left eye in the 10 o'clock position.  She is currently reporting a significant headache and double vision.  Blood pressure 20 minutes after arrival to urgent care after initial reading was 199/113.  No focal neurological deficit identified on physical exam.  Urinalysis performed today due to subjective urinary frequency and dizziness did not show urinary tract infection, but did show proteinuria and small amount of hematuria.  Nonfasting CBG slightly elevated today at 104.   Plan to treat gastroesophageal reflux disease today with famotidine to be taken twice daily.  Provided education regarding acid reflux triggers and foods to avoid.  She is to set up for at least 2 hours prior to laying down to go to bed after eating dinner to allow for appropriate digestion of food to avoid acid reflux.  She is to follow-up with her primary care provider regarding this.  Recommend patient go to the emergency department for further evaluation of high blood pressure, headache, and blurry vision to rule out emergent intracranial  abnormalities and end organ damage due to past history.   Patient and daughter verbalized agreement and understanding of plan.  Plan to send patient by private vehicle as she is stable at this time and does not require [facility] transport to the emergency department.   Daughter provided interpretation for entirety of encounter.  Patient agrees with this and declines offer for medical interpreter.  Final Clinical Impressions(s) / UC Diagnoses   Final diagnoses:  Acute nonintractable headache, unspecified headache type  Essential hypertension  Gastroesophageal reflux disease without esophagitis     Discharge Instructions      Your urine was negative for infection today.  We gave you Tylenol in the clinic today for your headache.  Your blood pressure continues to be very elevated and I am very concerned that this is caused damage to your body.  I would like for you to go to the emergency room immediately to be checked out to make sure that you do not have any significant kidney damage or brain damage due to your elevated blood pressure, headache, and double vision.   Recommend you to go [facility] Emergency Department immediately for further evaluation.   I have prescribed famotidine for you to take twice daily 30 minutes before meals for gastroesophageal reflux disease (acid reflux).  Avoid spicy, fatty, and citrus-based foods as these can all be triggers for acid reflux.  Decrease the amount of salt in your diet.      ED Prescriptions     Medication Sig Dispense Auth. Provider   famotidine (PEPCID) 20 MG tablet Take 1 tablet (20 mg total) by mouth 2 (two) times daily. 30 tablet Carlisle Beers, FNP      PDMP not reviewed this encounter.   Carlisle Beers, [place] 10/27/21 1327

## 2021-10-27 NOTE — ED Triage Notes (Signed)
C/o left red eye x 2 months. Pt blood pressure is elevated in the office. Pt interpreter stated she was on blood pressure medication but stop taking it due to stomach upset.

## 2021-11-04 ENCOUNTER — Other Ambulatory Visit: Payer: Self-pay

## 2022-02-02 ENCOUNTER — Other Ambulatory Visit: Payer: Self-pay

## 2022-02-02 ENCOUNTER — Encounter (HOSPITAL_COMMUNITY): Payer: Self-pay | Admitting: Emergency Medicine

## 2022-02-02 ENCOUNTER — Inpatient Hospital Stay (HOSPITAL_COMMUNITY)
Admission: EM | Admit: 2022-02-02 | Discharge: 2022-02-05 | DRG: 065 | Disposition: A | Discharge status: Home / Self Care | Payer: 59 | Attending: Internal Medicine | Admitting: Internal Medicine

## 2022-02-02 ENCOUNTER — Emergency Department (HOSPITAL_COMMUNITY): Payer: 59

## 2022-02-02 DIAGNOSIS — Z91199 Patient's noncompliance with other medical treatment and regimen due to unspecified reason: Secondary | ICD-10-CM

## 2022-02-02 DIAGNOSIS — I639 Cerebral infarction, unspecified: Secondary | ICD-10-CM | POA: Diagnosis not present

## 2022-02-02 DIAGNOSIS — Z8249 Family history of ischemic heart disease and other diseases of the circulatory system: Secondary | ICD-10-CM

## 2022-02-02 DIAGNOSIS — Z79899 Other long term (current) drug therapy: Secondary | ICD-10-CM

## 2022-02-02 DIAGNOSIS — D509 Iron deficiency anemia, unspecified: Secondary | ICD-10-CM | POA: Diagnosis present

## 2022-02-02 DIAGNOSIS — R29810 Facial weakness: Secondary | ICD-10-CM | POA: Diagnosis present

## 2022-02-02 DIAGNOSIS — G8191 Hemiplegia, unspecified affecting right dominant side: Secondary | ICD-10-CM | POA: Diagnosis present

## 2022-02-02 DIAGNOSIS — M549 Dorsalgia, unspecified: Secondary | ICD-10-CM | POA: Diagnosis present

## 2022-02-02 DIAGNOSIS — I6522 Occlusion and stenosis of left carotid artery: Secondary | ICD-10-CM | POA: Diagnosis present

## 2022-02-02 DIAGNOSIS — R200 Anesthesia of skin: Secondary | ICD-10-CM | POA: Diagnosis present

## 2022-02-02 DIAGNOSIS — E876 Hypokalemia: Secondary | ICD-10-CM | POA: Diagnosis present

## 2022-02-02 DIAGNOSIS — R079 Chest pain, unspecified: Secondary | ICD-10-CM | POA: Diagnosis present

## 2022-02-02 DIAGNOSIS — F0150 Vascular dementia without behavioral disturbance: Secondary | ICD-10-CM | POA: Diagnosis present

## 2022-02-02 DIAGNOSIS — I160 Hypertensive urgency: Secondary | ICD-10-CM | POA: Diagnosis present

## 2022-02-02 DIAGNOSIS — R54 Age-related physical debility: Secondary | ICD-10-CM | POA: Diagnosis present

## 2022-02-02 DIAGNOSIS — R269 Unspecified abnormalities of gait and mobility: Secondary | ICD-10-CM | POA: Diagnosis present

## 2022-02-02 DIAGNOSIS — I6389 Other cerebral infarction: Secondary | ICD-10-CM | POA: Diagnosis not present

## 2022-02-02 DIAGNOSIS — H11001 Unspecified pterygium of right eye: Secondary | ICD-10-CM | POA: Diagnosis present

## 2022-02-02 DIAGNOSIS — R29701 NIHSS score 1: Secondary | ICD-10-CM | POA: Diagnosis present

## 2022-02-02 DIAGNOSIS — R253 Fasciculation: Secondary | ICD-10-CM | POA: Diagnosis present

## 2022-02-02 DIAGNOSIS — I10 Essential (primary) hypertension: Secondary | ICD-10-CM | POA: Diagnosis present

## 2022-02-02 DIAGNOSIS — Z8673 Personal history of transient ischemic attack (TIA), and cerebral infarction without residual deficits: Secondary | ICD-10-CM

## 2022-02-02 DIAGNOSIS — E785 Hyperlipidemia, unspecified: Secondary | ICD-10-CM | POA: Diagnosis present

## 2022-02-02 DIAGNOSIS — Z603 Acculturation difficulty: Secondary | ICD-10-CM | POA: Diagnosis present

## 2022-02-02 DIAGNOSIS — Z20822 Contact with and (suspected) exposure to covid-19: Secondary | ICD-10-CM | POA: Diagnosis present

## 2022-02-02 DIAGNOSIS — R569 Unspecified convulsions: Secondary | ICD-10-CM | POA: Diagnosis present

## 2022-02-02 DIAGNOSIS — R109 Unspecified abdominal pain: Secondary | ICD-10-CM | POA: Diagnosis present

## 2022-02-02 LAB — I-STAT CHEM 8, ED
BUN: 24 mg/dL — ABNORMAL HIGH (ref 8–23)
Calcium, Ion: 1.17 mmol/L (ref 1.15–1.40)
Chloride: 110 mmol/L (ref 98–111)
Creatinine, Ser: 1.1 mg/dL — ABNORMAL HIGH (ref 0.44–1.00)
Glucose, Bld: 97 mg/dL (ref 70–99)
HCT: 35 % — ABNORMAL LOW (ref 36.0–46.0)
Hemoglobin: 11.9 g/dL — ABNORMAL LOW (ref 12.0–15.0)
Potassium: 3.6 mmol/L (ref 3.5–5.1)
Sodium: 141 mmol/L (ref 135–145)
TCO2: 21 mmol/L — ABNORMAL LOW (ref 22–32)

## 2022-02-02 LAB — CBG MONITORING, ED: Glucose-Capillary: 102 mg/dL — ABNORMAL HIGH (ref 70–99)

## 2022-02-02 NOTE — ED Triage Notes (Signed)
High blood pressure for 3 days R side facial droop and R side sensation change. LKW 2045.  Not on blood thinners.  SBP 210 at home.

## 2022-02-02 NOTE — ED Provider Triage Note (Signed)
Emergency Medicine Provider Triage Evaluation Note  Iraida Cragin , a 61 y.o. female  was evaluated in triage.  Pt complains of numbness weakness on the right side, per daughter at bedside she endorses that patient had an episode of this yesterday, last proxy 10 minutes, she had numbness and weakness on her right side then this morning when she woke up around 840 she again had numbness and tingling on her right side she went to sleep and felt a little bit better, symptoms then got worse at 8:30 PM and they brought her here for further evaluation has a history of a stroke with right-sided weakness and numbness.  Review of Systems  Positive: Weakness, numbness Negative: Vision, head injury  Physical Exam  BP (!) 228/110   Pulse 84   Temp 97.7 F (36.5 C) (Oral)   Resp 15   SpO2 100%  Gen:   Awake, no distress   Resp:  Normal effort  MSK:   Moves extremities without difficulty  Other:  Cranial nerves II through XII grossly intact no difficulty with word finding following two-step commands no unilateral weakness present.  Intact finger-to-nose, patient is able to ambulate has a slight gait disturbance on the right side.   Medical Decision Making  Medically screening exam initiated at 11:06 PM.  Appropriate orders placed.  Tanita Scafidi was informed that the remainder of the evaluation will be completed by another provider, this initial triage assessment does not replace that evaluation, and the importance of remaining in the ED until their evaluation is complete.  Last known well was prior to 8:40 AM today, spoke with Dr. Denton Lank who recommend stroke work-up at this time does not meet criteria for code stroke.   Carroll Sage, PA-C 02/02/22 2309

## 2022-02-02 NOTE — ED Notes (Signed)
While speaking to PA, pt and family update that LKW was actually this morning not 8pm.

## 2022-02-03 ENCOUNTER — Inpatient Hospital Stay (HOSPITAL_COMMUNITY): Payer: 59

## 2022-02-03 ENCOUNTER — Emergency Department (HOSPITAL_COMMUNITY): Payer: 59

## 2022-02-03 DIAGNOSIS — R109 Unspecified abdominal pain: Secondary | ICD-10-CM

## 2022-02-03 DIAGNOSIS — E785 Hyperlipidemia, unspecified: Secondary | ICD-10-CM | POA: Diagnosis present

## 2022-02-03 DIAGNOSIS — R269 Unspecified abnormalities of gait and mobility: Secondary | ICD-10-CM | POA: Diagnosis present

## 2022-02-03 DIAGNOSIS — I10 Essential (primary) hypertension: Secondary | ICD-10-CM | POA: Diagnosis present

## 2022-02-03 DIAGNOSIS — M549 Dorsalgia, unspecified: Secondary | ICD-10-CM | POA: Diagnosis present

## 2022-02-03 DIAGNOSIS — R29701 NIHSS score 1: Secondary | ICD-10-CM | POA: Diagnosis present

## 2022-02-03 DIAGNOSIS — E876 Hypokalemia: Secondary | ICD-10-CM

## 2022-02-03 DIAGNOSIS — D509 Iron deficiency anemia, unspecified: Secondary | ICD-10-CM | POA: Diagnosis present

## 2022-02-03 DIAGNOSIS — Z8249 Family history of ischemic heart disease and other diseases of the circulatory system: Secondary | ICD-10-CM | POA: Diagnosis not present

## 2022-02-03 DIAGNOSIS — I6522 Occlusion and stenosis of left carotid artery: Secondary | ICD-10-CM | POA: Diagnosis present

## 2022-02-03 DIAGNOSIS — Z603 Acculturation difficulty: Secondary | ICD-10-CM | POA: Diagnosis present

## 2022-02-03 DIAGNOSIS — I160 Hypertensive urgency: Secondary | ICD-10-CM | POA: Diagnosis present

## 2022-02-03 DIAGNOSIS — R569 Unspecified convulsions: Secondary | ICD-10-CM

## 2022-02-03 DIAGNOSIS — R29810 Facial weakness: Secondary | ICD-10-CM | POA: Diagnosis present

## 2022-02-03 DIAGNOSIS — R079 Chest pain, unspecified: Secondary | ICD-10-CM | POA: Diagnosis not present

## 2022-02-03 DIAGNOSIS — H11001 Unspecified pterygium of right eye: Secondary | ICD-10-CM | POA: Diagnosis present

## 2022-02-03 DIAGNOSIS — I639 Cerebral infarction, unspecified: Secondary | ICD-10-CM | POA: Diagnosis present

## 2022-02-03 DIAGNOSIS — E7800 Pure hypercholesterolemia, unspecified: Secondary | ICD-10-CM | POA: Diagnosis not present

## 2022-02-03 DIAGNOSIS — G8191 Hemiplegia, unspecified affecting right dominant side: Secondary | ICD-10-CM | POA: Diagnosis present

## 2022-02-03 DIAGNOSIS — Z91199 Patient's noncompliance with other medical treatment and regimen due to unspecified reason: Secondary | ICD-10-CM | POA: Diagnosis not present

## 2022-02-03 DIAGNOSIS — R54 Age-related physical debility: Secondary | ICD-10-CM | POA: Diagnosis present

## 2022-02-03 DIAGNOSIS — I6389 Other cerebral infarction: Secondary | ICD-10-CM

## 2022-02-03 DIAGNOSIS — F0150 Vascular dementia without behavioral disturbance: Secondary | ICD-10-CM | POA: Diagnosis present

## 2022-02-03 DIAGNOSIS — R200 Anesthesia of skin: Secondary | ICD-10-CM | POA: Diagnosis present

## 2022-02-03 DIAGNOSIS — Z20822 Contact with and (suspected) exposure to covid-19: Secondary | ICD-10-CM | POA: Diagnosis present

## 2022-02-03 DIAGNOSIS — Z8673 Personal history of transient ischemic attack (TIA), and cerebral infarction without residual deficits: Secondary | ICD-10-CM | POA: Diagnosis not present

## 2022-02-03 DIAGNOSIS — Z79899 Other long term (current) drug therapy: Secondary | ICD-10-CM | POA: Diagnosis not present

## 2022-02-03 LAB — RAPID URINE DRUG SCREEN, HOSP PERFORMED
Amphetamines: NOT DETECTED
Barbiturates: NOT DETECTED
Benzodiazepines: NOT DETECTED
Cocaine: NOT DETECTED
Opiates: NOT DETECTED
Tetrahydrocannabinol: NOT DETECTED

## 2022-02-03 LAB — ECHOCARDIOGRAM COMPLETE
Area-P 1/2: 4.06 cm2
P 1/2 time: 405 msec
S' Lateral: 3 cm

## 2022-02-03 LAB — COMPREHENSIVE METABOLIC PANEL
ALT: 14 U/L (ref 0–44)
AST: 32 U/L (ref 15–41)
Albumin: 4.2 g/dL (ref 3.5–5.0)
Alkaline Phosphatase: 94 U/L (ref 38–126)
Anion gap: 8 (ref 5–15)
BUN: 20 mg/dL (ref 8–23)
CO2: 24 mmol/L (ref 22–32)
Calcium: 9.8 mg/dL (ref 8.9–10.3)
Chloride: 109 mmol/L (ref 98–111)
Creatinine, Ser: 0.99 mg/dL (ref 0.44–1.00)
GFR, Estimated: >60 mL/min (ref >60)
Glucose, Bld: 93 mg/dL (ref 70–99)
Potassium: 3.4 mmol/L — ABNORMAL LOW (ref 3.5–5.1)
Sodium: 141 mmol/L (ref 135–145)
Total Bilirubin: 0.8 mg/dL (ref 0.3–1.2)
Total Protein: 7.6 g/dL (ref 6.5–8.1)

## 2022-02-03 LAB — URINALYSIS, ROUTINE W REFLEX MICROSCOPIC
Bacteria, UA: NONE SEEN
Bilirubin Urine: NEGATIVE
Glucose, UA: NEGATIVE mg/dL
Hgb urine dipstick: NEGATIVE
Ketones, ur: NEGATIVE mg/dL
Leukocytes,Ua: NEGATIVE
Nitrite: NEGATIVE
Protein, ur: 30 mg/dL — AB
Specific Gravity, Urine: 1.008 (ref 1.005–1.030)
pH: 5 (ref 5.0–8.0)

## 2022-02-03 LAB — DIFFERENTIAL
Abs Immature Granulocytes: 0.02 10*3/uL (ref 0.00–0.07)
Basophils Absolute: 0 10*3/uL (ref 0.0–0.1)
Basophils Relative: 1 %
Eosinophils Absolute: 0.3 10*3/uL (ref 0.0–0.5)
Eosinophils Relative: 4 %
Immature Granulocytes: 0 %
Lymphocytes Relative: 35 %
Lymphs Abs: 2.2 10*3/uL (ref 0.7–4.0)
Monocytes Absolute: 0.4 10*3/uL (ref 0.1–1.0)
Monocytes Relative: 6 %
Neutro Abs: 3.3 10*3/uL (ref 1.7–7.7)
Neutrophils Relative %: 54 %

## 2022-02-03 LAB — PROTIME-INR
INR: 1 (ref 0.8–1.2)
Prothrombin Time: 13.4 seconds (ref 11.4–15.2)

## 2022-02-03 LAB — CBC
HCT: 32.5 % — ABNORMAL LOW (ref 36.0–46.0)
Hemoglobin: 10 g/dL — ABNORMAL LOW (ref 12.0–15.0)
MCH: 22.7 pg — ABNORMAL LOW (ref 26.0–34.0)
MCHC: 30.8 g/dL (ref 30.0–36.0)
MCV: 73.7 fL — ABNORMAL LOW (ref 80.0–100.0)
Platelets: 185 10*3/uL (ref 150–400)
RBC: 4.41 MIL/uL (ref 3.87–5.11)
RDW: 15.6 % — ABNORMAL HIGH (ref 11.5–15.5)
WBC: 6.2 10*3/uL (ref 4.0–10.5)
nRBC: 0 % (ref 0.0–0.2)

## 2022-02-03 LAB — APTT: aPTT: 28 seconds (ref 24–36)

## 2022-02-03 LAB — LIPID PANEL
Cholesterol: 211 mg/dL — ABNORMAL HIGH (ref 0–200)
HDL: 62 mg/dL (ref >40)
LDL Cholesterol: 131 mg/dL — ABNORMAL HIGH (ref 0–99)
Total CHOL/HDL Ratio: 3.4 RATIO
Triglycerides: 90 mg/dL (ref <150)
VLDL: 18 mg/dL (ref 0–40)

## 2022-02-03 LAB — TROPONIN I (HIGH SENSITIVITY)
Troponin I (High Sensitivity): 12 ng/L (ref <18)
Troponin I (High Sensitivity): 14 ng/L (ref <18)

## 2022-02-03 LAB — HEMOGLOBIN A1C
Hgb A1c MFr Bld: 4.9 % (ref 4.8–5.6)
Mean Plasma Glucose: 93.93 mg/dL

## 2022-02-03 LAB — RESP PANEL BY RT-PCR (FLU A&B, COVID) ARPGX2
Influenza A by PCR: NEGATIVE
Influenza B by PCR: NEGATIVE
SARS Coronavirus 2 by RT PCR: NEGATIVE

## 2022-02-03 LAB — ETHANOL: Alcohol, Ethyl (B): <10 mg/dL (ref <10)

## 2022-02-03 MED ORDER — ENOXAPARIN SODIUM 40 MG/0.4ML IJ SOSY
40.0000 mg | PREFILLED_SYRINGE | INTRAMUSCULAR | Status: DC
  Administered 2022-02-03 – 2022-02-05 (×3): 40 mg via SUBCUTANEOUS
  Filled 2022-02-03 (×3): qty 0.4

## 2022-02-03 MED ORDER — LABETALOL HCL 5 MG/ML IV SOLN: 20.0000 mg | INTRAVENOUS | Status: DC | PRN

## 2022-02-03 MED ORDER — ASPIRIN 81 MG PO TBEC
81.0000 mg | DELAYED_RELEASE_TABLET | Freq: Every day | ORAL | Status: DC
  Administered 2022-02-03 – 2022-02-05 (×3): 81 mg via ORAL
  Filled 2022-02-03 (×3): qty 1

## 2022-02-03 MED ORDER — CLOPIDOGREL BISULFATE 75 MG PO TABS
75.0000 mg | ORAL_TABLET | Freq: Every day | ORAL | Status: DC
  Administered 2022-02-03 – 2022-02-05 (×3): 75 mg via ORAL
  Filled 2022-02-03 (×3): qty 1

## 2022-02-03 MED ORDER — MORPHINE SULFATE (PF) 2 MG/ML IV SOLN
2.0000 mg | INTRAVENOUS | Status: DC | PRN
  Administered 2022-02-03: 2 mg via INTRAVENOUS
  Filled 2022-02-03: qty 1

## 2022-02-03 MED ORDER — HYDRALAZINE HCL 20 MG/ML IJ SOLN
10.0000 mg | INTRAMUSCULAR | Status: DC | PRN
  Administered 2022-02-03: 10 mg via INTRAVENOUS
  Filled 2022-02-03: qty 1

## 2022-02-03 MED ORDER — HYDROCODONE-ACETAMINOPHEN 5-325 MG PO TABS
1.0000 | ORAL_TABLET | Freq: Four times a day (QID) | ORAL | Status: DC | PRN
  Administered 2022-02-04: 1 via ORAL
  Filled 2022-02-03: qty 1

## 2022-02-03 MED ORDER — ATORVASTATIN CALCIUM 80 MG PO TABS
80.0000 mg | ORAL_TABLET | Freq: Every day | ORAL | Status: DC
  Administered 2022-02-03 – 2022-02-05 (×3): 80 mg via ORAL
  Filled 2022-02-03: qty 2
  Filled 2022-02-03 (×4): qty 1

## 2022-02-03 MED ORDER — DICLOFENAC SODIUM 1 % EX GEL
2.0000 g | Freq: Four times a day (QID) | CUTANEOUS | Status: DC
  Administered 2022-02-03 – 2022-02-05 (×6): 2 g via TOPICAL
  Filled 2022-02-03: qty 100

## 2022-02-03 MED ORDER — POTASSIUM CHLORIDE CRYS ER 20 MEQ PO TBCR
40.0000 meq | EXTENDED_RELEASE_TABLET | ORAL | Status: AC
Start: 2022-02-03 — End: 2022-02-03
  Administered 2022-02-03: 40 meq via ORAL
  Filled 2022-02-03: qty 2

## 2022-02-03 MED ORDER — ACETAMINOPHEN 325 MG PO TABS
650.0000 mg | ORAL_TABLET | Freq: Four times a day (QID) | ORAL | Status: DC | PRN
  Administered 2022-02-03: 650 mg via ORAL
  Filled 2022-02-03: qty 2

## 2022-02-03 MED ORDER — STROKE: EARLY STAGES OF RECOVERY BOOK
Freq: Once | Status: AC
Start: 2022-02-03 — End: 2022-02-03
  Filled 2022-02-03: qty 1

## 2022-02-03 MED ORDER — SODIUM CHLORIDE 0.9 % IV SOLN: INTRAVENOUS | Status: AC

## 2022-02-03 MED ORDER — TAMSULOSIN HCL 0.4 MG PO CAPS
0.4000 mg | ORAL_CAPSULE | Freq: Every day | ORAL | Status: DC
  Administered 2022-02-03 – 2022-02-04 (×2): 0.4 mg via ORAL
  Filled 2022-02-03 (×2): qty 1

## 2022-02-03 MED ORDER — ACETAMINOPHEN 650 MG RE SUPP: 650.0000 mg | Freq: Four times a day (QID) | RECTAL | Status: DC | PRN

## 2022-02-03 NOTE — H&P (Signed)
History and Physical    PatientTanylah Berry ZOX:[id number] DOB: 03-16-1961 DOA: 02/02/2022 DOS: the patient was seen and examined on 02/03/2022 PCP: Grayce Sessions, NP  Patient coming from: Home  Chief Complaint:  Chief Complaint  Patient presents with   Numbness   HPI: Diane Berry is a 61 y.o. [place] speaking female with medical history significant of prior hypertension, hyperlipidemia, monocular blindness, prior intraventricular hemorrhage in 2019 who presented with right-sided facial droop along with numbness of her right arm and leg.  History is obtained from the patient's daughter who is present at bedside due to difficulties with obtaining an interpreter.  Every thing seemed to started 3 days ago at around 12 PM.  She developed weakness in the right face, arm, leg with twitching, and difficulty speaking.  Symptoms lasted approximately 5 minutes prior to self resolving.  Each day patient had similar symptoms lasting 5 to 10 minutes prior to self resolving.  Patient noted blood pressures were elevated in the 200s.  Also related to her symptoms she had been having left-sided headaches and dizziness.  Daughter brings up the fact that over the last 2 months or more patient has been intermittently complaining of left-sided chest pain, abdominal pain, and back pain.  Daughter relates it to the foods that she is eating.  Patient has never had a colonoscopy.  On admission to the emergency department patient was seen as a code stroke.  CT scan of the head did not note any acute abnormality was chronic small vessel ischemic changes.  Noted noted to be tachypneic with blood pressures elevated up to 228/110.Labs noted hemoglobin 10, potassium 3.4, BUN 20, creatinine 0.99, and LDL 131.  MRI of the brain noted 8 mm acute ischemic nonhemorrhagic left thalamic infarct with events chronic microvascular ischemic disease with multiple infarcts, and multiple chronic micro hemorrhages clustered about the  deep gray nuclei and brainstem.  MRI noted moderate to severe intracranial atherosclerotic changes.   Review of Systems: As mentioned in the history of present illness. All other systems reviewed and are negative. Past Medical History:  Diagnosis Date   Stroke (HCC) 07/24/2017   intraventricular hemorrhage   Past Surgical History:  Procedure Laterality Date   IR ANGIO INTRA EXTRACRAN SEL COM CAROTID INNOMINATE BILAT MOD SED  07/27/2017   IR ANGIO VERTEBRAL SEL VERTEBRAL BILAT MOD SED  07/27/2017   Social History:  reports that she has never smoked. She has never used smokeless tobacco. She reports that she does not drink alcohol and does not use drugs.  No Known Allergies  Family History  Problem Relation Age of Onset   Hypertension Sister     Prior to Admission medications   Medication Sig Start Date End Date Taking? Authorizing Provider  atorvastatin (LIPITOR) 20 MG tablet Take 1 tablet (20 mg total) by mouth daily at 6 PM. 07/19/19   Grayce Sessions, NP  Blood Pressure Monitor DEVI 1 Bag by Does not apply route 3 (three) times daily. 07/17/19   Grayce Sessions, NP  famotidine (PEPCID) 20 MG tablet Take 1 tablet (20 mg total) by mouth 2 (two) times daily. 10/27/21   Carlisle Beers, FNP  loratadine (CLARITIN) 10 MG tablet Take 1 tablet (10 mg total) by mouth daily. 07/17/19   Grayce Sessions, NP  Multiple Vitamins-Iron (MULTIVITAMINS WITH IRON) TABS tablet Take 1 tablet by mouth daily. 07/19/19   Grayce Sessions, NP    Physical Exam: Vitals:   02/03/22 0615 02/03/22 0630  02/03/22 0645 02/03/22 0700  BP: (!) 200/97 (!) 212/93 (!) 192/103 (!) 220/97  Pulse: 68 76 72 80  Resp: (!) 22 20 (!) 22 (!) 24  Temp:   98.5 F (36.9 C)   TempSrc:      SpO2: 98% 99% 98% 99%   Exam  Constitutional: Elderly female who appears to be in acute distress at this time Eyes: Subtle drooping of the right eyelid.  Impaired vision out of right eye. ENMT: Mucous membranes are  moist.  Poor dentition. Neck: normal, supple  Respiratory: clear to auscultation bilaterally, no wheezing, no crackles. Normal respiratory effort. No accessory muscle use.  Cardiovascular: Regular rate and rhythm,  No extremity edema.   Abdomen: no tenderness, no masses palpated.  Bowel sounds positive.  Musculoskeletal: no clubbing / cyanosis. No joint deformity upper and lower extremities. Good ROM, no contractures. Normal muscle tone.  Skin: no rashes, lesions, ulcers. No induration Neurologic: CN 2-12 grossly intact.  Strength 4+/5 in all 4 extremities.  Abnormal sensation to light touch of arm and leg. Psychiatric: Normal judgment and insight. Alert and oriented x 3. Normal mood.   Data Reviewed:  EKG revealed normal sinus rhythm at 77 bpm.  Reviewed labs, imaging, and pertinent records as noted above in HPI Assessment and Plan: CVA Acute.  Patient presented with 3-day history of intermittent episodes of right face, upper, and lower extremity numbness/weakness.  MRI noted 8 mm acute ischemic nonhemorrhagic left thalamic infarct.  She was noted to be a candidate and a large vessel occlusion was appreciated. - Admit to telemetry bed - Stroke order set initiated - Neuro checks - Check echocardiogram - PT/OT/Speech to eval and treat - Check Hemoglobin A1c (4.9) and lipid panel in a.m. - ASA and Plavix for 3 weeks then aspirin alone - Appreciate neurology consultative services, will follow-up  -Social work consult   Hypertensive urgency Blood pressures elevated up to 228/110 on admission. -Allowing for permissive hypertension.  Hydralazine IV as needed for systolic blood pressures greater than 220 or diastolics greater than 110  Seizure-like activity Patient noted to have intermittent episodes concerning for possible seizure-like activity. -Seizure precaution -Monitor for seizure-like activity -Check EEG  Hypokalemia Acute.  Initial potassium 3.4. -Give potassium chloride  p.o. -Continue to monitor and replace as needed  Microcytic hypochromic anemia Hemoglobin 10 which appears around patient's baseline with low MCV and MCH. -Check iron studies in a.m. -Recommend need to follow-up in outpatient setting for colonoscopy.  Chest, abdominal, back pain Patient had reported complaints of chest, abdomen, and back pain. She had 2 negative high-sensitivity troponins.  EKG without significant signs of ischemia. -Check portable chest x-ray -Check CT scan of the abdomen pelvis(CT noted nonobstructing left renal stone versus benign focal calcification, but not clear if this is contributing to her pain) -Hydrocodone/morphine IV as needed for moderate to severe pain  Cognitive decline Imaging noted concern for possible vascular dementia..  Likely related to uncontrolled hypertension. -Delirium precautions  Hyperlipidemia LDL noted to be elevated at 131.  Prior home medication regimen includes atorvastatin 20 mg daily, but patient had not been on any medication for treatment in over 6 months. -Goal LDL less than 70 -Increase atorvastatin to 80 mg daily  DVT prophylaxis: Lovenox Advance Care Planning:   Code Status: Full Code   Consults: Neurology  Family Communication: Daughter was updated at bedside  Severity of Illness: The appropriate patient status for this patient is INPATIENT. Inpatient status is judged to be reasonable and  necessary in order to provide the required intensity of service to ensure the patient's safety. The patient's presenting symptoms, physical exam findings, and initial radiographic and laboratory data in the context of their chronic comorbidities is felt to place them at high risk for further clinical deterioration. Furthermore, it is not anticipated that the patient will be medically stable for discharge from the hospital within 2 midnights of admission.   * I certify that at the point of admission it is my clinical judgment that the patient  will require inpatient hospital care spanning beyond 2 midnights from the point of admission due to high intensity of service, high risk for further deterioration and high frequency of surveillance required.*  Author: Clydie Braun, MD 02/03/2022 8:33 AM  For on call review [email or web address].

## 2022-02-03 NOTE — Consult Note (Signed)
Neurology Consultation    Reason for Consult: ischemic left thalamic infarction  CC: tingling right hand x 2 days   HISTORY OF PRESENT ILLNESS   HPI  History is obtained from:patient with translation from her daughter, at bedside, as patient speak dialect for which no interpreter available.   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 Patient's daughter noted persistent right facial, arm, and leg muscle twitching and paucity of speech x 5 minutes with spontaneous resolution. Patient's daughter massaged areas of muscle activity without change in this symptom. The patient returned to baseline shortly after this time. Her daughter thought symptoms were due to heat exhaustion and let her rest. The following day, the patient again had an episode as described for which her daughter "cut all her hair off and put in the shower". This also lasted 5 minutes; although the patient was able to stand upright with minimal aide during this time. The patient had a 3rd episode, this time while ambulating and with prominent right leg "shaking". During this episode, the patient recalls feeling weak with inability to control R leg> R arm and face./ She was standing and did not fall, of note. This episode lasted 10 minutes with spontaneous resolution. Patient's daughter then brought patient to [facility] for care after taking her BP at this time, it  having measured systolic.  Patient chief complaint at time of event was "weakness" and inability to control her right side, per daughter. Currently, the patient's chief complaint is that of tingling in digits 2-4 of RUE without provoking or remitting factors. She has been ambulating with PT in the hallways.   Imaging on this admission reveals left thalamic diffusion restriction suggested of acute ischemia for which neurology is consulted  Other + ROS symptoms: -Chest pain that radiates to her back which was stated to have begun this past May, although upon chart review, this was documented in summer 2019 for which she was seeing her PCP, who felt patient suffered from both GERD and chronic constipation. Most recently, she went to urgent care for this, at which time her BP was also >275mmHg and was sent to ED. Miralax, pepto bisdmol, omeprazole, and tums minimally effective at home. Chest is constant, pleuritic with inhalation, and not reproducible to palpation on author's examination. -continued visual field cut right eye, monocular, with noted pterygium, unchanged.  The patient denies headache, dizziness, visual changes, problems with swallowing or speech, focal muscle weakness, numbness or tingling of her extremities, abnormal movements, or other focal neurological deficits.  ROS: Full ROS was performed and is negative except as noted in the HPI.   Premorbid modified Rankin scale (mRS):  3-Moderate disability-requires help but walks WITHOUT assistance. In need of help with cognitive tasks, ADLs.  PAST MEDICAL HISTORY    Past Medical History:  Past Medical History:  Diagnosis Date   Stroke (HCC) 07/24/2017   intraventricular hemorrhage    No family history on file. Family History  Problem Relation Age  of Onset   Hypertension Sister     Allergies:  No Known Allergies  Social History:   reports that she has never smoked. She has never used smokeless tobacco. She reports that  she does not drink alcohol and does not use drugs.    Medications (Not in a hospital admission)   EXAMINATION    Current vital signs:    02/03/2022    7:00 AM 02/03/2022    6:45 AM 02/03/2022    6:30 AM  Vitals with BMI  Systolic 220 192 998  Diastolic 97 103 93  Pulse 80 72 76    Examination:  GENERAL: Awake, alert in NAD HEENT: - Normocephalic and atraumatic, dry mm, no lymphadenopathy, no Thyromegally LUNGS - Clear to auscultation bilaterally CV - S1S2 RRR, equal pulses bilaterally. ABDOMEN - Soft, nontender, nondistended with normoactive BS Ext: warm, well perfused, intact peripheral pulses, no pedal edema  NEURO:  Mental Status: AA&Ox2-3 Language: speech is seemingly in tact without dysarthria nor aphasia, but translation provided by patient's daughter in absence of apropos interpreter. Cranial Nerves:  II: PERRL. Visual fields full to confrontation OS and OU, impaired x 4 field OD.  III, IV, VI: EOM in tact. Eyelids elevate symmetrically.  V: Sensation intact V1-3 symmetrically  VII: no facial asymmetry   VIII: hearing intact to voice IX, X: Palate elevates symmetrically. Phonation is normal.  PJ:ASNKNLZJ shrug 5/5 and symmetrical  XII: tongue is midline without fasciculations. Motor:  RUE:  grip   5/5    biceps 5 /5    triceps 5 /5    deltoid 4-5  /5  LUE: grip 5 /5    biceps  5 /5    triceps 5 /5  deltoid 5/5  RLE: hip flex 5/5    knee  5 /5     plantar flexion  5 /5     dorsiflexion   5/5        LLE: hip flex   5 /5    knee 5 /5    plantar flexion    5/5     dorsiflexion    5/5 Tone: is normal and bulk is normal DTRs:deferred Sensation- impaired right arm/leg but not face to pin prick and light touch  Coordination: FTN intact bilaterally, no ataxia in BLE., no abnormal movements  Gait- ambulates without clear ataxia or gait dysequilibrium   NIHSS 1 for RUE/RLE sensory impairment , able to feel but less sensate   LABS   I have reviewed labs in epic and the  results pertinent to this consultation are:  Lab Results  Component Value Date   LDLCALC 131 (H) 02/03/2022   Lab Results  Component Value Date   ALT 14 02/03/2022   AST 32 02/03/2022   ALKPHOS 94 02/03/2022   BILITOT 0.8 02/03/2022   Lab Results  Component Value Date   HGBA1C 4.9 02/03/2022   Lab Results  Component Value Date   WBC 6.2 02/03/2022   HGB 10.0 (L) 02/03/2022   HCT 32.5 (L) 02/03/2022   MCV 73.7 (L) 02/03/2022   PLT 185 02/03/2022   Lab Results  Component Value Date   VITAMINB12 1,254 (H) 07/24/2017   Lab Results  Component Value Date   FOLATE 23.6 07/24/2017   Lab Results  Component Value Date   NA 141 02/03/2022   K 3.4 (L) 02/03/2022   CL 109 02/03/2022   CO2 24 02/03/2022   DIAGNOSTIC IMAGING/PROCEDURES   I have reviewed the images obtained:,  as below    CT-head: small vessel ischemic changes most prominent in R frontal regions R>L  MRA head: intracranial stenosis throughout ; left cavernous ICA and  multi-level left vertebral artery with soft-appearing plaque in addition to calcified plaque and diminutive right vertebral artery with V4 cutoff  MRI brain: acute diffusion restriction left thalamus, chronic appearing infarctions right MCA, BL basal ganglia, BL thalami, BL R> L pons, left cerebellum.   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  She presents with 2 day history of 3 separate instance of involuntary R face, arm , and leg movement +/- impairment in level of consciousness followed by right hemiparesis that  continues to improve and with still constant paresthesias RUE and R hemisensory impairment that spares face.  Found to have left thalamic infarction and systolic BP> [id number].   Patient to be seen and discussed with Dr. Leonel Ramsay.  -- Lennie Hummer, PA-C Neurology Department   I have seen the patient and reviewed the note.  Impression: Acute left thalamic infarction presenting with episodes that could be concerning for self-resolving complex partial seizures.  Alternatively, given the language barrier, I do wonder if this could be fluctuating motor discoordination as opposed to true clonic like activity.  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  Certainly, with her history of intracranial hemorrhage she is at risk for seizures and could consider starting an antiepileptic, but would favor getting an EEG first.  Alternatively, this could be considered provoked seizure in the setting of acute stroke.  Recommendations: Acute ischemic infarction -DAPT with ASA 81 + Plavix 75 mg PO QD x 21 days, followed by 81mg  ASA indefinitely -statin with goal LDL < 70 , currently 131  -Hgb A1c 4.9, WNL  -diet/exercise counseling    -higher level of care/daughter education on importance of blood pressure control   2. ?Seizure activity:  -Wait for patient to declare  herself rather than start AED at this time. Please  page neurology team with any suspicious seizure activity.   -If she has any further episodes concerning for seizure activity, would load with Keppra 1 g followed by 500 mg twice daily  -EEG  3. Cognitive Decline: -Although she may have developed vascular dementia, with microhemorrhages noted on SWI sequence of MRI and history of seemingly spontaneous intraventricular hemorrhage, we do need to keep Cerebral Amyloid Angiopathy on differential despite microhemorrhage locations being more commonly associated with those of uncontrolled, chronic hypertension   4. Chest/back pain  - per  primary team, see HPI. Ordered troponins to rule out ACS  Roland Rack, MD [facility] [phone]  If 7pm- 7am, please page neurology on call as listed in [place].

## 2022-02-03 NOTE — Progress Notes (Signed)
Occupational Therapy Evaluation Patient Details Name: Diane Berry MRN: [id number] DOB: [birth year] Today's Date: 02/03/2022   History of Present Illness Pt is a 61 y/o female admitted secondary to R weakness. Found to have L thalamic infarct. PMH includes HTN and intracranial hemmorhage.   Clinical Impression   [employer]/Video interpreter did not have an interpreter to use. Daughter called and helped interpret over phone. Apparently pt independent with ADL, IADL and mobility PTA. Currently requires min A with mobility and ADL tasks. Pt reports vision changes - will further assess. At this time recommend DC home with direct S with ADL and mobility @ RW level and follow up with OT at a neruo outpt center. Will follow.      Recommendations for follow up therapy are one component of a multi-disciplinary discharge planning process, led by the attending physician.  Recommendations may be updated based on patient status, additional functional criteria and insurance authorization.   Follow Up Recommendations  Outpatient OT    Assistance Recommended at Discharge Frequent or constant Supervision/Assistance  Patient can return home with the following A little help with walking and/or transfers;A little help with bathing/dressing/bathroom;Assistance with cooking/housework;Direct supervision/assist for medications management;Direct supervision/assist for financial management;Assist for transportation;Help with stairs or ramp for entrance    Functional Status Assessment  Patient has had a recent decline in their functional status and demonstrates the ability to make significant improvements in function in a reasonable and predictable amount of time.  Equipment Recommendations  BSC/3in1    Recommendations for Other Services       Precautions / Restrictions Precautions Precautions: Fall Restrictions Weight Bearing Restrictions: No      Mobility Bed Mobility Overal bed mobility: Needs  Assistance Bed Mobility: Supine to Sit, Sit to Supine     Supine to sit: Min assist Sit to supine: Min assist   General bed mobility comments: Min A For trunk assist to come to sitting. Min A for return to supine to help scoot hips back on EOB.    Transfers Overall transfer level: Needs assistance Equipment used: 2 person hand held assist Transfers: Sit to/from Stand Sit to Stand: Min guard           General transfer comment: Min guard A for safety      Balance Overall balance assessment: Needs assistance Sitting-balance support: No upper extremity supported, Feet supported Sitting balance-Leahy Scale: Fair     Standing balance support: Bilateral upper extremity supported Standing balance-Leahy Scale: Poor Standing balance comment: Reliant on UE support                           ADL either performed or assessed with clinical judgement   ADL Overall ADL's : Needs assistance/impaired Eating/Feeding: Set up   Grooming: Set up;Supervision/safety   Upper Body Bathing: Supervision/ safety;Set up   Lower Body Bathing: Minimal assistance   Upper Body Dressing : Set up;Supervision/safety;Sitting   Lower Body Dressing: Minimal assistance   Toilet Transfer: Minimal assistance;Ambulation   Toileting- Clothing Manipulation and Hygiene: Minimal assistance       Functional mobility during ADLs: Minimal assistance General ADL Comments: would likely do better with RW; needs assistnace during ADL tasks to reduce risk of falls     Vision Patient Visual Report: Blurring of vision Additional Comments: deficits noted with vision in R eye at baseline. Per daughter vision is "worse". Appears to be explaining what is double vision. Will further assess     Perception  Praxis      Pertinent Vitals/Pain Pain Assessment Pain Assessment: Faces Faces Pain Scale: No hurt     Hand Dominance     Extremity/Trunk Assessment Upper Extremity Assessment Upper  Extremity Assessment: RUE deficits/detail RUE Deficits / Details: attempting to use funcitonally; "clumsy"; using to operate phone however prefers to use LUE.? L hand dominant? will further assess   Lower Extremity Assessment Lower Extremity Assessment: Defer to PT evaluation RLE Deficits / Details: Noted coordination deficits when taking steps with RLE   Cervical / Trunk Assessment Cervical / Trunk Assessment: Normal;Other exceptions   Communication Communication Communication: Prefers language other than English (no interpreter with appropriate dialect. Called daughter on phone to help with interpretation at end of session)   Cognition Arousal/Alertness: Awake/alert Behavior During Therapy: Flat affect Overall Cognitive Status: Difficult to assess                                 General Comments: Able to follow gestures     General Comments       Exercises     Shoulder Instructions      Home Living Family/patient expects to be discharged to:: Private residence Living Arrangements: Children Available Help at Discharge: Family;Available 24 hours/day Type of Home: House Home Access: Stairs to enter [employer] of Steps: 2 Entrance Stairs-Rails:  (has a wall) Home Layout: One level     Bathroom Shower/Tub: Chief Strategy Officer: Standard     Home Equipment: Grab bars - tub/shower          Prior Functioning/Environment Prior Level of Function : Independent/Modified Independent                        OT Problem List: Decreased strength;Decreased range of motion;Decreased activity tolerance;Impaired balance (sitting and/or standing);Impaired vision/perception;Decreased coordination;Decreased safety awareness;Decreased knowledge of use of DME or AE      OT Treatment/Interventions: Self-care/ADL training;Therapeutic exercise;Neuromuscular education;DME and/or AE instruction;Therapeutic activities;Visual/perceptual  remediation/compensation;Patient/family education;Balance training    OT Goals(Current goals can be found in the care plan section) Acute Rehab OT Goals Patient Stated Goal: per daughter for her Mom to get better OT Goal Formulation: With family Time For Goal Achievement: 02/17/22 Potential to Achieve Goals: Good  OT Frequency: Min 2X/week    Co-evaluation PT/OT/SLP Co-Evaluation/Treatment: Yes Reason for Co-Treatment: Other (comment) (due to language barrier) PT goals addressed during session: Balance;Mobility/safety with mobility        AM-PAC OT "6 Clicks" Daily Activity     Outcome Measure Help from another person eating meals?: A Little Help from another person taking care of personal grooming?: A Little Help from another person toileting, which includes using toliet, bedpan, or urinal?: A Little Help from another person bathing (including washing, rinsing, drying)?: A Little Help from another person to put on and taking off regular upper body clothing?: A Little Help from another person to put on and taking off regular lower body clothing?: A Little 6 Click Score: 18   End of Session Equipment Utilized During Treatment: Gait belt Nurse Communication: Mobility status  Activity Tolerance: Patient tolerated treatment well Patient left: in bed;with call bell/phone within reach  OT Visit Diagnosis: Unsteadiness on feet (R26.81);Other abnormalities of gait and mobility (R26.89);Muscle weakness (generalized) (M62.81)                Time: 3532-9924 OT Time Calculation (min): 30 min Charges:  OT General Charges $OT Visit: 1 Visit OT Evaluation $OT Eval Moderate Complexity: 1 Mod  Debar Plate, OT/L   Acute OT Clinical Specialist Acute Rehabilitation Services Pager [phone] Office [phone]   [facility] 02/03/2022, 12:11 PM

## 2022-02-03 NOTE — Progress Notes (Signed)
Echocardiogram 2D Echocardiogram has been performed.  Warren Lacy Inez Stantz RDCS 02/03/2022, 2:57 PM  Daughter had to leave to pick up child, but is available to translate by phone if needed.

## 2022-02-03 NOTE — ED Provider Notes (Signed)
[facility] EMERGENCY DEPARTMENT Provider Note   CSN: [id number] Arrival date & time: 02/02/22  2029     History  Chief Complaint  Patient presents with   Numbness    Diane Berry is a 61 y.o. female.  The history is provided by the patient and a relative. A language interpreter was used.  She has history of hypertension, hyperlipidemia, intracranial hemorrhage with stroke and comes in with intermittent numbness in the right side of the body which started yesterday.  Numbness does involve the face, arm, leg.  There has also been some slight weakness.  She has also endorsed a headache for the last 2 days but has difficulty localizing the headache.  There has been no nausea or vomiting.  Symptoms initially improved but is not clear if they completely resolved.  Symptoms have definitely been constant since 8 PM.   Home Medications Prior to Admission medications   Medication Sig Start Date End Date Taking? Authorizing Provider  atorvastatin (LIPITOR) 20 MG tablet Take 1 tablet (20 mg total) by mouth daily at 6 PM. 07/19/19   Kerin Perna, NP  Blood Pressure Monitor DEVI 1 Bag by Does not apply route 3 (three) times daily. 07/17/19   Kerin Perna, NP  famotidine (PEPCID) 20 MG tablet Take 1 tablet (20 mg total) by mouth 2 (two) times daily. 10/27/21   Talbot Grumbling, FNP  loratadine (CLARITIN) 10 MG tablet Take 1 tablet (10 mg total) by mouth daily. 07/17/19   Kerin Perna, NP  Multiple Vitamins-Iron (MULTIVITAMINS WITH IRON) TABS tablet Take 1 tablet by mouth daily. 07/19/19   Kerin Perna, NP      Allergies    Patient has no known allergies.    Review of Systems   Review of Systems  All other systems reviewed and are negative.   Physical Exam Updated Vital Signs BP (!) 187/87   Pulse 73   Temp 97.7 F (36.5 C) (Oral)   Resp (!) 24   SpO2 100%  Physical Exam Vitals and nursing note reviewed.   61 year old female, resting  comfortably and in no acute distress. Vital signs are significant for elevated blood pressure. Oxygen saturation is 98%, which is normal. Head is normocephalic and atraumatic. PERRLA, EOMI. Oropharynx is clear. Neck is nontender and supple without adenopathy or JVD.  There are no carotid bruits. Back is nontender and there is no CVA tenderness. Lungs are clear without rales, wheezes, or rhonchi. Chest is nontender. Heart has regular rate and rhythm without murmur. Abdomen is soft, flat, nontender. Extremities have no cyanosis or edema, full range of motion is present. Skin is warm and dry without rash. Neurologic: Awake and alert.  Oriented x3.  Speech is normal.  There is decreased sensation on the right side of the face in all 3 divisions of cranial nerve V, but remainder cranial nerves are intact.  There is very slight right sided pronator drift.  There is minimal decreased strength in the right arm and right leg with strength 4+/5.  There is vague decreased sensation in the right arm and right leg compared with the left arm and leg.  Station and gait are not tested.  ED Results / Procedures / Treatments   Labs (all labs ordered are listed, but only abnormal results are displayed) Labs Reviewed  CBC - Abnormal; Notable for the following components:      Result Value   Hemoglobin 10.0 (*)    HCT  32.5 (*)    MCV 73.7 (*)    MCH 22.7 (*)    RDW 15.6 (*)    All other components within normal limits  COMPREHENSIVE METABOLIC PANEL - Abnormal; Notable for the following components:   Potassium 3.4 (*)    All other components within normal limits  LIPID PANEL - Abnormal; Notable for the following components:   Cholesterol 211 (*)    LDL Cholesterol 131 (*)    All other components within normal limits  CBG MONITORING, ED - Abnormal; Notable for the following components:   Glucose-Capillary 102 (*)    All other components within normal limits  I-STAT CHEM 8, ED - Abnormal; Notable for the  following components:   BUN 24 (*)    Creatinine, Ser 1.10 (*)    TCO2 21 (*)    Hemoglobin 11.9 (*)    HCT 35.0 (*)    All other components within normal limits  RESP PANEL BY RT-PCR (FLU A&B, COVID) ARPGX2  ETHANOL  PROTIME-INR  APTT  DIFFERENTIAL  HEMOGLOBIN A1C  RAPID URINE DRUG SCREEN, HOSP PERFORMED  URINALYSIS, ROUTINE W REFLEX MICROSCOPIC    EKG EKG Interpretation  Date/Time:  Monday February 02 2022 22:57:17 EDT Ventricular Rate:  77 PR Interval:  158 QRS Duration: 86 QT Interval:  388 QTC Calculation: 439 R Axis:   14 Text Interpretation: Normal sinus rhythm Normal ECG When compared with ECG of 23-Jul-2017 20:49, No significant change was found Confirmed by Dione Booze ([id number]) on 02/03/2022 2:53:34 AM  Radiology MR BRAIN WO CONTRAST  Result Date: 02/03/2022 CLINICAL DATA:  Initial evaluation for neuro deficit, stroke suspected. EXAM: MRI HEAD WITHOUT CONTRAST MRA HEAD WITHOUT CONTRAST TECHNIQUE: Multiplanar, multi-echo pulse sequences of the brain and surrounding structures were acquired without intravenous contrast. Angiographic images of the Circle of Willis were acquired using MRA technique without intravenous contrast. COMPARISON:  Prior CT from 02/02/2022 as well as prior MRI from 07/24/2017. FINDINGS: MRI HEAD FINDINGS Brain: Generalized age-related cerebral atrophy. Patchy and confluent T2/FLAIR hyperintensity involving the periventricular deep white matter both cerebral hemispheres as well as the pons, most consistent with chronic small vessel ischemic disease, fairly advanced 6 in nature. Multiple scattered remote lacunar infarcts present about the hemispheric cerebral white matter, bilateral basal ganglia, thalami, and pons. Small remote left cerebellar infarct noted. 8 mm focus of restricted diffusion involving the lateral left thalamus, consistent with an acute ischemic infarct (series 5, image 72). No associated hemorrhage or significant mass effect. No other  evidence for acute or subacute ischemia. Gray-white matter differentiation maintained with no other areas of chronic cortical infarction. No acute intracranial hemorrhage. Multiple chronic micro hemorrhages noted, most pronounced about the brainstem and deep gray nuclei, consistent with chronic poorly controlled hypertension. Susceptibility artifact partially involves the right lateral ventricle, consistent with previously seen intraventricular hemorrhage in 2019. No mass lesion, midline shift or mass effect. No hydrocephalus or extra-axial fluid collection. Pituitary gland and suprasellar region within normal limits. Vascular: Heterogeneous flow void within the right V4 segment, consistent with atherosclerotic change as seen on corresponding MRA. Major intracranial vascular flow voids are otherwise maintained. Skull and upper cervical spine: Craniocervical junction within normal limits. Bone marrow signal intensity within normal limits. Few well-circumscribed T1 hyperintense lesions noted at the left parieto-occipital calvarium, stable from prior, consistent with a benign etiology. No scalp soft tissue abnormality. Sinuses/Orbits: Prominent axial myopia noted at the left globe. Globes orbital soft tissues demonstrate no other acute finding. Scattered chronic mucosal thickening noted  about the ethmoidal air cells and maxillary sinuses. No mastoid effusion. Other: None. MRA HEAD FINDINGS Anterior circulation: Examination degraded by motion artifact. Visualized distal cervical segments of the internal carotid arteries are patent with antegrade flow. Petrous segments patent bilaterally. Atheromatous irregularity seen throughout the carotid siphons bilaterally. Associated moderate approximate 50-60% stenosis at the anterior genu of the cavernous left ICA (series 1, image 103). More mild to moderate narrowing about the contralateral right carotid siphon. A1 segments patent bilaterally. Normal anterior communicating  artery complex. Atheromatous irregularity seen throughout the ACAs bilaterally with associated moderate to severe multifocal A2 and A3 stenoses bilaterally. M1 segments remain patent without stenosis. Normal MCA bifurcations. No proximal MCA branch occlusion. Distal small vessel atheromatous irregularity throughout the MCA branches bilaterally. Posterior circulation: Both vertebral arteries patent as they course into the cranial vault. Left vertebral artery dominant. Atheromatous change within the dominant left V4 segment with associated moderate approximate 50% stenosis just distal to the takeoff of the left PICA (series 1, image 39). Left PICA remains patent. Extensive atheromatous irregularity throughout the intracranial right V4 segment with moderate to severe multifocal stenoses. Right PICA remains patent. Short-segment fenestration noted about the proximal basilar artery. Basilar itself is somewhat irregular but patent to its distal aspect without high-grade stenosis. Superior cerebellar arteries patent at their origins. Both PCAs primarily supplied via the basilar. Atheromatous change about the origins of both PCAs with associated moderate to severe stenoses about the P1/P2 segments bilaterally (series 1052, images 10, 12). Additional atheromatous change distally within both PCAs with probable multifocal moderate to severe distal P2/P3 stenoses. Distal right PCA is attenuated as compared to the left. Anatomic variants: As above.  No visible aneurysm. IMPRESSION: MRI HEAD IMPRESSION: 1. 8 mm acute ischemic nonhemorrhagic left thalamic infarct. 2. Underlying age-related cerebral atrophy with advanced chronic microvascular ischemic disease, with multiple remote lacunar infarcts involving the hemispheric cerebral white matter, bilateral basal ganglia, thalami, and pons. Small remote left cerebellar infarct. 3. Multiple chronic micro hemorrhages clustered about the deep gray nuclei and brainstem, most  characteristic of chronic poorly controlled hypertension. MRA HEAD IMPRESSION: 1. Negative intracranial MRA for acute large vessel occlusion. 2. Moderate to severe intracranial atherosclerotic change as above. Notable findings include moderate to severe stenoses about the cavernous left ICA, bilateral A2/A3 segments, right greater than left V4 segments, and bilateral PCAs. Electronically Signed   By: Jeannine Boga M.D.   On: 02/03/2022 04:30   MR ANGIO HEAD WO CONTRAST  Result Date: 02/03/2022 CLINICAL DATA:  Initial evaluation for neuro deficit, stroke suspected. EXAM: MRI HEAD WITHOUT CONTRAST MRA HEAD WITHOUT CONTRAST TECHNIQUE: Multiplanar, multi-echo pulse sequences of the brain and surrounding structures were acquired without intravenous contrast. Angiographic images of the Circle of Willis were acquired using MRA technique without intravenous contrast. COMPARISON:  Prior CT from 02/02/2022 as well as prior MRI from 07/24/2017. FINDINGS: MRI HEAD FINDINGS Brain: Generalized age-related cerebral atrophy. Patchy and confluent T2/FLAIR hyperintensity involving the periventricular deep white matter both cerebral hemispheres as well as the pons, most consistent with chronic small vessel ischemic disease, fairly advanced 6 in nature. Multiple scattered remote lacunar infarcts present about the hemispheric cerebral white matter, bilateral basal ganglia, thalami, and pons. Small remote left cerebellar infarct noted. 8 mm focus of restricted diffusion involving the lateral left thalamus, consistent with an acute ischemic infarct (series 5, image 72). No associated hemorrhage or significant mass effect. No other evidence for acute or subacute ischemia. Gray-white matter differentiation maintained with no other areas  of chronic cortical infarction. No acute intracranial hemorrhage. Multiple chronic micro hemorrhages noted, most pronounced about the brainstem and deep gray nuclei, consistent with chronic poorly  controlled hypertension. Susceptibility artifact partially involves the right lateral ventricle, consistent with previously seen intraventricular hemorrhage in 2019. No mass lesion, midline shift or mass effect. No hydrocephalus or extra-axial fluid collection. Pituitary gland and suprasellar region within normal limits. Vascular: Heterogeneous flow void within the right V4 segment, consistent with atherosclerotic change as seen on corresponding MRA. Major intracranial vascular flow voids are otherwise maintained. Skull and upper cervical spine: Craniocervical junction within normal limits. Bone marrow signal intensity within normal limits. Few well-circumscribed T1 hyperintense lesions noted at the left parieto-occipital calvarium, stable from prior, consistent with a benign etiology. No scalp soft tissue abnormality. Sinuses/Orbits: Prominent axial myopia noted at the left globe. Globes orbital soft tissues demonstrate no other acute finding. Scattered chronic mucosal thickening noted about the ethmoidal air cells and maxillary sinuses. No mastoid effusion. Other: None. MRA HEAD FINDINGS Anterior circulation: Examination degraded by motion artifact. Visualized distal cervical segments of the internal carotid arteries are patent with antegrade flow. Petrous segments patent bilaterally. Atheromatous irregularity seen throughout the carotid siphons bilaterally. Associated moderate approximate 50-60% stenosis at the anterior genu of the cavernous left ICA (series 1, image 103). More mild to moderate narrowing about the contralateral right carotid siphon. A1 segments patent bilaterally. Normal anterior communicating artery complex. Atheromatous irregularity seen throughout the ACAs bilaterally with associated moderate to severe multifocal A2 and A3 stenoses bilaterally. M1 segments remain patent without stenosis. Normal MCA bifurcations. No proximal MCA branch occlusion. Distal small vessel atheromatous irregularity  throughout the MCA branches bilaterally. Posterior circulation: Both vertebral arteries patent as they course into the cranial vault. Left vertebral artery dominant. Atheromatous change within the dominant left V4 segment with associated moderate approximate 50% stenosis just distal to the takeoff of the left PICA (series 1, image 39). Left PICA remains patent. Extensive atheromatous irregularity throughout the intracranial right V4 segment with moderate to severe multifocal stenoses. Right PICA remains patent. Short-segment fenestration noted about the proximal basilar artery. Basilar itself is somewhat irregular but patent to its distal aspect without high-grade stenosis. Superior cerebellar arteries patent at their origins. Both PCAs primarily supplied via the basilar. Atheromatous change about the origins of both PCAs with associated moderate to severe stenoses about the P1/P2 segments bilaterally (series 1052, images 10, 12). Additional atheromatous change distally within both PCAs with probable multifocal moderate to severe distal P2/P3 stenoses. Distal right PCA is attenuated as compared to the left. Anatomic variants: As above.  No visible aneurysm. IMPRESSION: MRI HEAD IMPRESSION: 1. 8 mm acute ischemic nonhemorrhagic left thalamic infarct. 2. Underlying age-related cerebral atrophy with advanced chronic microvascular ischemic disease, with multiple remote lacunar infarcts involving the hemispheric cerebral white matter, bilateral basal ganglia, thalami, and pons. Small remote left cerebellar infarct. 3. Multiple chronic micro hemorrhages clustered about the deep gray nuclei and brainstem, most characteristic of chronic poorly controlled hypertension. MRA HEAD IMPRESSION: 1. Negative intracranial MRA for acute large vessel occlusion. 2. Moderate to severe intracranial atherosclerotic change as above. Notable findings include moderate to severe stenoses about the cavernous left ICA, bilateral A2/A3 segments,  right greater than left V4 segments, and bilateral PCAs. Electronically Signed   By: Rise Mu M.D.   On: 02/03/2022 04:30   CT Head Wo Contrast  Result Date: 02/02/2022 CLINICAL DATA:  TIA EXAM: CT HEAD WITHOUT CONTRAST TECHNIQUE: Contiguous axial images were obtained from  the base of the skull through the vertex without intravenous contrast. RADIATION DOSE REDUCTION: This exam was performed according to the departmental dose-optimization program which includes automated exposure control, adjustment of the mA and/or kV according to patient size and/or use of iterative reconstruction technique. COMPARISON:  CT head dated 07/24/2017 FINDINGS: Brain: No evidence of acute infarction, hemorrhage, hydrocephalus, extra-axial collection or mass lesion/mass effect. Subcortical white matter and periventricular small vessel ischemic changes. Vascular: Intracranial atherosclerosis. Skull: Normal. Negative for fracture or focal lesion. Sinuses/Orbits: The visualized paranasal sinuses are essentially clear. The mastoid air cells are unopacified. Other: None. IMPRESSION: No evidence of acute intracranial abnormality. Small vessel ischemic changes. Electronically Signed   By: Julian Hy M.D.   On: 02/02/2022 23:31    Procedures Procedures  Cardiac monitor shows normal sinus rhythm, per my interpretation.  Medications Ordered in ED Medications  labetalol (NORMODYNE) injection 20 mg (has no administration in time range)    ED Course/ Medical Decision Making/ A&P                           Medical Decision Making Amount and/or Complexity of Data Reviewed Labs: ordered. Radiology: ordered.  Risk Prescription drug management. Decision regarding hospitalization.   Right-sided numbness and weakness concerning for acute stroke.  Old records are reviewed, and she had been hospitalized on 07/23/2017 at which time she was noted to have intraventricular hemorrhage with no neurodeficits.  Code stroke  is not activated because of uncertainty of time of last known normal and relatively mild symptoms.  CT of the head showed no acute intracranial abnormality.  I have independently viewed the images and agree with radiologist interpretation.  Laboratory work-up had been ordered, but the only test actually done was Chem-8 which was unremarkable, I have reordered laboratory work-up of CBC, comprehensive metabolic panel, INR, ethanol level, drug screen.  I have ordered MRI of the brain and MRA of the head.  In light of her severe hypertension and headache, she will need to be admitted for blood pressure control and management of hypertensive urgency lab.  I ordered a dose of labetalol for her blood pressure.  Blood pressures had modest response to labetalol.  MRI does show an acute left thalamic stroke which accounts for her symptoms.  MRA shows no acute occlusions but chronic atherosclerotic changes.  Case is discussed with Dr. Velia Meyer of [facility], who agrees to admit the patient.  CRITICAL CARE Performed by: Delora Fuel Total critical care time: 50 minutes Critical care time was exclusive of separately billable procedures and treating other patients. Critical care was necessary to treat or prevent imminent or life-threatening deterioration. Critical care was time spent personally by me on the following activities: development of treatment plan with patient and/or surrogate as well as nursing, discussions with consultants, evaluation of patient's response to treatment, examination of patient, obtaining history from patient or surrogate, ordering and performing treatments and interventions, ordering and review of laboratory studies, ordering and review of radiographic studies, pulse oximetry and re-evaluation of patient's condition.  Final Clinical Impression(s) / ED Diagnoses Final diagnoses:  Cerebrovascular accident (CVA), unspecified mechanism ([place])  Hypertensive urgency  Microcytic anemia   Hypokalemia    Rx / DC Orders ED Discharge Orders     None         Delora Fuel, MD [id number]

## 2022-02-03 NOTE — Progress Notes (Signed)
   02/03/22 1736  Assess: MEWS Score  Temp 98.6 F (37 C)  BP (!) 222/92  MAP (mmHg) 129  Pulse Rate 76  ECG Heart Rate 80  Resp (!) 24  Level of Consciousness Alert  SpO2 99 %  Assess: MEWS Score  MEWS Temp 0  MEWS Systolic 2  MEWS Pulse 0  MEWS RR 1  MEWS LOC 0  MEWS Score 3  MEWS Score Color Yellow  Assess: if the MEWS score is Yellow or Red  Were vital signs taken at a resting state? Yes  Focused Assessment No change from prior assessment  Does the patient meet 2 or more of the SIRS criteria? No  MEWS guidelines implemented *See Row Information* Yes  Treat  Pain Scale 0-10  Pain Score 2  Pain Location Abdomen  Pain Orientation Right  Pain Intervention(s) Emotional support  Take Vital Signs  Increase Vital Sign Frequency  Yellow: Q 2hr X 2 then Q 4hr X 2, if remains yellow, continue Q 4hrs  Escalate  MEWS: Escalate Yellow: discuss with charge nurse/RN and consider discussing with provider and RRT  Notify: Charge Nurse/RN  Name of Charge Nurse/RN Notified Tracey  Date Charge Nurse/RN Notified 02/03/22  Time Charge Nurse/RN Notified 1736  Notify: Provider  Provider Name/Title Dr. Katrinka Blazing  Date Provider Notified 02/03/22  Time Provider Notified 1736  Method of Notification Page  Notification Reason Other (Comment) (yellow mews)  Provider response No new orders (pain meds ordered)  Date of Provider Response 02/03/22  Time of Provider Response 1736  Assess: SIRS CRITERIA  SIRS Temperature  0  SIRS Pulse 0  SIRS Respirations  1  SIRS WBC 1  SIRS Score Sum  2

## 2022-02-03 NOTE — Progress Notes (Signed)
EEG complete - results pending 

## 2022-02-03 NOTE — Evaluation (Signed)
Physical Therapy Evaluation Patient Details Name: Diane Berry MRN: [id number] DOB: Oct 15, 1960 Today's Date: 02/03/2022  History of Present Illness  Pt is a 62 y/o female admitted secondary to R weakness. Found to have L thalamic infarct. PMH includes HTN and intracranial hemmorhage.  Clinical Impression  Pt admitted secondary to problem above with deficits below. Pt requiring min guard to min A for steadying throughout mobility tasks. Difficult to assess formal deficits as family not present and no formal interpreter available with appropriate dialect. Pt with decreased coordination in RLE throughout. Recommend use of RW at home to increase safety. Per discussion with daughter at end of session, she is able to assist pt as needed. Recommending outpatient PT at d/c to address current deficits. Will continue to follow acutely.      Recommendations for follow up therapy are one component of a multi-disciplinary discharge planning process, led by the attending physician.  Recommendations may be updated based on patient status, additional functional criteria and insurance authorization.  Follow Up Recommendations Outpatient PT      Assistance Recommended at Discharge Frequent or constant Supervision/Assistance  Patient can return home with the following  A little help with walking and/or transfers;A little help with bathing/dressing/bathroom;Assistance with cooking/housework;Help with stairs or ramp for entrance;Assist for transportation    Equipment Recommendations Rolling walker (2 wheels);BSC/3in1  Recommendations for Other Services       Functional Status Assessment Patient has had a recent decline in their functional status and demonstrates the ability to make significant improvements in function in a reasonable and predictable amount of time.     Precautions / Restrictions Precautions Precautions: Fall Restrictions Weight Bearing Restrictions: No      Mobility  Bed  Mobility Overal bed mobility: Needs Assistance Bed Mobility: Supine to Sit, Sit to Supine     Supine to sit: Min assist Sit to supine: Min assist   General bed mobility comments: Min A For trunk assist to come to sitting. Min A for return to supine to help scoot hips back on EOB.    Transfers Overall transfer level: Needs assistance Equipment used: 2 person hand held assist Transfers: Sit to/from Stand Sit to Stand: Min guard           General transfer comment: Min guard A for safety    Ambulation/Gait Ambulation/Gait assistance: Min guard, Min assist Gait Distance (Feet): 150 Feet Assistive device: 1 person hand held assist Gait Pattern/deviations: Step-through pattern, Decreased stride length Gait velocity: Decreased     General Gait Details: Tended to drift to the R and noted decreased coordination when taking steps with RLE. Tended to lean to the R as well.  Stairs            Wheelchair Mobility    Modified Rankin (Stroke Patients Only) Modified Rankin (Stroke Patients Only) Pre-Morbid Rankin Score: No significant disability Modified Rankin: Moderately severe disability     Balance Overall balance assessment: Needs assistance Sitting-balance support: No upper extremity supported, Feet supported Sitting balance-Leahy Scale: Fair     Standing balance support: Bilateral upper extremity supported Standing balance-Leahy Scale: Poor Standing balance comment: Reliant on UE support                             Pertinent Vitals/Pain Pain Assessment Pain Assessment: Faces Faces Pain Scale: No hurt    Home Living Family/patient expects to be discharged to:: Private residence Living Arrangements: Children Available Help at Discharge: Family;Available  24 hours/day Type of Home: House Home Access: Stairs to enter Entrance Stairs-Rails:  (has a wall) Entrance Stairs-Number of Steps: 2   Home Layout: One level Home Equipment: Grab bars -  tub/shower      Prior Function Prior Level of Function : Independent/Modified Independent                     Hand Dominance        Extremity/Trunk Assessment   Upper Extremity Assessment Upper Extremity Assessment: Defer to OT evaluation    Lower Extremity Assessment Lower Extremity Assessment: RLE deficits/detail RLE Deficits / Details: Noted coordination deficits when taking steps with RLE    Cervical / Trunk Assessment Cervical / Trunk Assessment: Normal  Communication   Communication: Prefers language other than English (no interpreter with appropriate dialect. Called daughter on phone to help with interpretation at end of session)  Cognition Arousal/Alertness: Awake/alert Behavior During Therapy: WFL for tasks assessed/performed Overall Cognitive Status: Difficult to assess                                 General Comments: Able to follow gestures        General Comments      Exercises     Assessment/Plan    PT Assessment Patient needs continued PT services  PT Problem List Decreased strength;Decreased activity tolerance;Decreased balance;Decreased mobility;Decreased knowledge of precautions;Decreased knowledge of use of DME       PT Treatment Interventions DME instruction;Gait training;Functional mobility training;Therapeutic activities;Therapeutic exercise;Balance training;Patient/family education;Neuromuscular re-education;Stair training    PT Goals (Current goals can be found in the Care Plan section)  Acute Rehab PT Goals PT Goal Formulation: Patient unable to participate in goal setting Time For Goal Achievement: 02/17/22 Potential to Achieve Goals: Good    Frequency Min 4X/week     Co-evaluation PT/OT/SLP Co-Evaluation/Treatment: Yes Reason for Co-Treatment: For patient/therapist safety;To address functional/ADL transfers PT goals addressed during session: Balance;Mobility/safety with mobility         AM-PAC PT "6  Clicks" Mobility  Outcome Measure Help needed turning from your back to your side while in a flat bed without using bedrails?: None Help needed moving from lying on your back to sitting on the side of a flat bed without using bedrails?: A Little Help needed moving to and from a bed to a chair (including a wheelchair)?: A Little Help needed standing up from a chair using your arms (e.g., wheelchair or bedside chair)?: A Little Help needed to walk in hospital room?: A Little Help needed climbing 3-5 steps with a railing? : A Little 6 Click Score: 19    End of Session Equipment Utilized During Treatment: Gait belt Activity Tolerance: Patient tolerated treatment well Patient left: in bed;with call bell/phone within reach (on stretcher in ED) Nurse Communication: Mobility status PT Visit Diagnosis: Unsteadiness on feet (R26.81);Other abnormalities of gait and mobility (R26.89);Muscle weakness (generalized) (M62.81)    Time: 8315-1761 PT Time Calculation (min) (ACUTE ONLY): 30 min   Charges:   PT Evaluation $PT Eval Moderate Complexity: 1 Mod          Farley Ly, PT, DPT  Acute Rehabilitation Services  Office: [phone]   Lehman Prom 02/03/2022, 10:25 AM

## 2022-02-03 NOTE — Progress Notes (Signed)
  Carryover admission to the Day Admitter.  I discussed this case with the EDP, Dr. Preston Fleeting.  Per these discussions:   This is a 61 year old female who is being admitted with acute ischemic stroke after presenting with right-sided numbness as well as right-sided weakness.  Symptoms reportedly started at some point on Sunday, 02/01/2022.  Symptoms subsequently waxed and waned before ultimately becoming constant, reportedly around 8 PM on Monday, 02/02/22, following which she presented to [facility] emergency department for further evaluation and management thereof.   Ensuing imaging notable for MRI brain which showed 8 mm acute ischemic nonhemorrhagic left thalamic infarct.  VS notable for systolic blood pressures greater than 200 mmHg.   Per EDP, On-call neurologist, Dr. Derry Lory, has been made aware and will formally consult.  Of note, the patient's exclusively speaks [place].    I have placed an order for admission for further evaluation/ management of acute ischemic stroke.  I have placed some additional preliminary admit orders via the adult multi-morbid admission order set, and completed the acute ischemic stroke OS as well.  She is currently n.p.o. pending nursing bedside swallow screen.  Ordered lipid panel, hemoglobin A1c, PT/OT consults.  Have also placed order for prn hydralazine for systolic blood pressure greater than 220 or diastolic blood pressure greater than 120 mmHg.   Newton Pigg, DO Hospitalist

## 2022-02-03 NOTE — ED Notes (Signed)
Patient transported to CT 

## 2022-02-03 NOTE — Progress Notes (Signed)
Patient arrived to room 3W29 with daughter at bedside translating.

## 2022-02-04 ENCOUNTER — Other Ambulatory Visit: Payer: Self-pay | Admitting: Psychiatry

## 2022-02-04 DIAGNOSIS — R569 Unspecified convulsions: Secondary | ICD-10-CM | POA: Diagnosis not present

## 2022-02-04 DIAGNOSIS — I639 Cerebral infarction, unspecified: Secondary | ICD-10-CM | POA: Diagnosis not present

## 2022-02-04 LAB — IRON AND TIBC
Iron: 41 ug/dL (ref 28–170)
Saturation Ratios: 14 % (ref 10.4–31.8)
TIBC: 286 ug/dL (ref 250–450)
UIBC: 245 ug/dL

## 2022-02-04 LAB — FERRITIN: Ferritin: 125 ng/mL (ref 11–307)

## 2022-02-04 MED ORDER — POTASSIUM CHLORIDE CRYS ER 20 MEQ PO TBCR
40.0000 meq | EXTENDED_RELEASE_TABLET | Freq: Once | ORAL | Status: AC
  Administered 2022-02-04: 40 meq via ORAL
  Filled 2022-02-04: qty 2

## 2022-02-04 MED ORDER — HYDROCODONE-ACETAMINOPHEN 5-325 MG PO TABS: 1.0000 | ORAL_TABLET | Freq: Four times a day (QID) | ORAL | Status: DC | PRN

## 2022-02-04 NOTE — Progress Notes (Signed)
[facility] [place]  PPI:[id number] DOB: [birth year] DOA: 02/02/2022 PCP: Grayce Sessions, NP    Brief Narrative:  61 year old with a history of HTN, HLD, monocular blindness, and a intraventricular hemorrhage in 2019 who presented to the ER with a 3-day history of right-sided facial droop and numbness of the right arm and leg.  The patient was a code stroke on presentation.  CT head did not reveal any acute abnormality but did note chronic small vessel ischemic change.  The patient's presenting blood pressure was 228/110.  MRI of the brain noted an 8 mm acute ischemic nonhemorrhagic left thalamic infarct with multiple prior microvascular ischemic infarcts and multiple chronic microhemorrhages.  Consultants:  Neurology  Goals of Care:  Code Status: Full Code   DVT prophylaxis: Lovenox  Interim Hx: Afebrile.  Vital signs stable.  Continues to report ongoing left facial arm and leg numbness.  No new complaints.  Assessment & Plan:  Small Acute ischemic thalamic CVA DAPT with aspirin plus Plavix x 21 days followed by aspirin alone -LDL 131 therefore resume and continue statin -A1c 4.9 -no acute related findings on TTE  - likely due to SVD with uncontrolled blood pressure -therapy recommending outpatient PT  Hypertensive urgency BP 228/110 on admission -careful lowering allowing for permissive hypertension  Seizure-like activity Further history reveals episodes of possible convulsive behavior -observing for now with neurology recommending against initiation of seizure medicine presently  Cognitive decline 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 related to hypertension  Hypokalemia Mild -supplement and follow  Microcytic hypochromic anemia Hemoglobin 10.0 which appears to be her baseline -check anemia panel  Chest abdominal and back pain on ROS No acute findings on CT abdomen/pelvis -no acute findings on CXR  Cognitive decline Likely vascular dementia noted findings on  MRI  HLD LDL 131 -patient was noncompliant with his atorvastatin at home  Family Communication: Spoke with daughter at bedside who provided translation Disposition: Anticipate probable discharge home 8/31   Objective: Blood pressure (!) 148/80, pulse 98, temperature 98.2 F (36.8 C), temperature source Oral, resp. rate 16, height 4\' 9"  (1.448 m), SpO2 98 %.  Intake/Output Summary (Last 24 hours) at 02/04/2022 1008 Last data filed at 02/04/2022 0500 Gross per 24 hour  Intake 902.91 ml  Output --  Net 902.91 ml   There were no vitals filed for this visit.  Examination: General: No acute respiratory distress Lungs: Clear to auscultation bilaterally without wheezes or crackles Cardiovascular: Regular rate and rhythm without murmur gallop or rub normal S1 and S2 Abdomen: Nontender, nondistended, soft, bowel sounds positive, no rebound, no ascites, no appreciable mass Extremities: No significant cyanosis, clubbing, or edema bilateral lower extremities  CBC: Recent Labs  Lab 02/02/22 2302 02/03/22 0335  WBC  --  6.2  NEUTROABS  --  3.3  HGB 11.9* 10.0*  HCT 35.0* 32.5*  MCV  --  73.7*  PLT  --  185   Basic Metabolic Panel: Recent Labs  Lab 02/02/22 2302 02/03/22 0335  NA 141 141  K 3.6 3.4*  CL 110 109  CO2  --  24  GLUCOSE 97 93  BUN 24* 20  CREATININE 1.10* 0.99  CALCIUM  --  9.8   GFR: CrCl cannot be calculated (Unknown ideal weight.).  Scheduled Meds:  aspirin EC  81 mg Oral Daily   atorvastatin  80 mg Oral Daily   clopidogrel  75 mg Oral Daily   diclofenac Sodium  2 g Topical QID   enoxaparin (LOVENOX) injection  40  mg Subcutaneous Q24H   tamsulosin  0.4 mg Oral QPC supper     LOS: 1 day   Lonia Blood, MD [facility] Office  [phone] Pager - Text Page per Amion  If 7PM-7AM, please contact night-coverage per Amion 02/04/2022, 10:08 AM

## 2022-02-04 NOTE — Progress Notes (Signed)
Physical Therapy Treatment Patient Details Name: Diane Berry MRN: [id number] DOB: 08/20/1960 Today's Date: 02/04/2022   History of Present Illness Pt is a 61 y/o female admitted secondary to R weakness. Found to have L thalamic infarct. PMH includes HTN and intracranial hemmorhage.    PT Comments    Pt with some progress with gait and transfers but still demonstrates R sided weakness.  Pt also with some impulsiveness and decreased safety awareness but difficult to fully assess due to language barrier with correct dialect not available on [employer] interpreter.  Pt ambulating 300' but requiring cues for RW use and min guard/min A due to R leg buckling.  Tolerated ambulation and exercises well. Educated pt's daughter on guarding with gait belt (provided gait belt). Recommend outpt PT.     Recommendations for follow up therapy are one component of a multi-disciplinary discharge planning process, led by the attending physician.  Recommendations may be updated based on patient status, additional functional criteria and insurance authorization.  Follow Up Recommendations  Outpatient PT     Assistance Recommended at Discharge Frequent or constant Supervision/Assistance  Patient can return home with the following A little help with walking and/or transfers;A little help with bathing/dressing/bathroom;Assistance with cooking/housework;Assistance with feeding;Help with stairs or ramp for entrance;Assist for transportation   Equipment Recommendations  Rolling walker (2 wheels);BSC/3in1    Recommendations for Other Services       Precautions / Restrictions Precautions Precautions: Fall     Mobility  Bed Mobility Overal bed mobility: Needs Assistance Bed Mobility: Supine to Sit, Sit to Supine     Supine to sit: Supervision Sit to supine: Supervision        Transfers Overall transfer level: Needs assistance Equipment used: None Transfers: Sit to/from Stand Sit to Stand: Min guard            General transfer comment: Min guard A for safety; performed x 2    Ambulation/Gait Ambulation/Gait assistance: Min guard, Min assist Gait Distance (Feet): 300 Feet Assistive device: Rolling walker (2 wheels), None Gait Pattern/deviations: Step-through pattern, Decreased stride length, Knees buckling, Scissoring Gait velocity: fast     General Gait Details: Attempted initially without RW - pt with fast, impulsive gait with occasional scissoring and R LE buckling but pt recovers.  Also, drifts to R.  When R leg would buckle pt would shake it some while still moving and laugh.  Daughter reports pt saying to her leg "why aren't you helping."  Tried with RW with some improvement in R leg buckling but pt needing frequent cues to take her time and stay close to [employer] Mobility    Modified Rankin (Stroke Patients Only) Modified Rankin (Stroke Patients Only) Pre-Morbid Rankin Score: No significant disability Modified Rankin: Moderately severe disability     Balance Overall balance assessment: Needs assistance Sitting-balance support: No upper extremity supported, Feet supported Sitting balance-Leahy Scale: Good     Standing balance support: Bilateral upper extremity supported, No upper extremity supported Standing balance-Leahy Scale: Fair Standing balance comment: Could static stand without support                            Cognition Arousal/Alertness: Awake/alert Behavior During Therapy: Impulsive Overall Cognitive Status: Difficult to assess Area of Impairment: Safety/judgement  Safety/Judgement: Decreased awareness of safety, Decreased awareness of deficits     General Comments: Pt moves quickly.  Pt frequently laughing during in regards to leg being weak and "not helping."        Exercises General Exercises - Lower Extremity Ankle Circles/Pumps: AROM, Both, 10 reps, Seated Quad  Sets: AROM, Right, 10 reps, Supine Long Arc Quad: AROM, Right, 10 reps, Seated Other Exercises Other Exercises: Sit to stand with R leg biased 10 x then 5 x.  PT guarding R knee to assess for buckling.  Pt requiring multimodal cues for correct exercise and cues for controlled descent    General Comments General comments (skin integrity, edema, etc.): Pt's daughter reports that pt weaker today than yesterday but better than last night.  Educated on close guarding at home and use of gait belt with follow up with outpt PT      Pertinent Vitals/Pain Pain Assessment Pain Assessment: No/denies pain    Home Living                          Prior Function            PT Goals (current goals can now be found in the care plan section) Progress towards PT goals: Progressing toward goals    Frequency    Min 4X/week      PT Plan Current plan remains appropriate    Co-evaluation              AM-PAC PT "6 Clicks" Mobility   Outcome Measure  Help needed turning from your back to your side while in a flat bed without using bedrails?: None Help needed moving from lying on your back to sitting on the side of a flat bed without using bedrails?: None Help needed moving to and from a bed to a chair (including a wheelchair)?: A Little Help needed standing up from a chair using your arms (e.g., wheelchair or bedside chair)?: A Little Help needed to walk in hospital room?: A Little Help needed climbing 3-5 steps with a railing? : A Little 6 Click Score: 20    End of Session Equipment Utilized During Treatment: Gait belt Activity Tolerance: Patient tolerated treatment well Patient left: in bed;with call bell/phone within reach;with bed alarm set;with family/visitor present Nurse Communication: Mobility status PT Visit Diagnosis: Unsteadiness on feet (R26.81);Other abnormalities of gait and mobility (R26.89);Muscle weakness (generalized) (M62.81)     Time: 2353-6144 PT Time  Calculation (min) (ACUTE ONLY): 28 min  Charges:  $Gait Training: 8-22 mins $Therapeutic Exercise: 8-22 mins                     Anise Salvo, PT Acute Rehab [facility] [phone]    Rayetta Humphrey 02/04/2022, 4:48 PM

## 2022-02-04 NOTE — Procedures (Signed)
Patient Name: Diane Berry  MRN: [id number]  Epilepsy Attending: Charlsie Quest  Referring Physician/Provider: Rejeana Brock, MD  Date: 02/03/2022 Duration: 22.18 mins  Patient history: 61 year old female with left thalamic infarct and t subsequent right facial, arm and leg twitching with possibility of speech. EEG to evaluate for seizure.  Level of alertness: Awake  AEDs during EEG study: None  Technical aspects: This EEG study was done with scalp electrodes positioned according to the 10-20 International system of electrode placement. Electrical activity was reviewed with band pass filter of 1-70Hz , sensitivity of 7 uV/mm, display speed of 59mm/sec with a 60Hz  notched filter applied as appropriate. EEG data were recorded continuously and digitally stored.  Video monitoring was available and reviewed as appropriate.  Description: The posterior dominant rhythm consists of 9 Hz activity of moderate voltage (25-35 uV) seen predominantly in posterior head regions, symmetric and reactive to eye opening and eye closing. Hyperventilation and photic stimulation were not performed.     IMPRESSION: This study is within normal limits. No seizures or epileptiform discharges were seen throughout the recording.  A normal interictal EEG does not exclude nor support the diagnosis of epilepsy.   Amiylah Anastos 

## 2022-02-04 NOTE — Progress Notes (Addendum)
STROKE TEAM PROGRESS NOTE   INTERVAL HISTORY Afebrile. Bps 130s. HR 90-100s. K 3.4. LDL 131. Hgb 10. UA unremarkable.  No family is at the bedside.   Patient's daughter was available over the speaker phone and translated for the patient to facilitate this exam Discussed with daughter over the phone. Daughter reports patient had episode of numbness of R face, arm, and leg that lasted 5 min. She reports her mom is still reporting numbness of her R face, arm, and leg. Per nursing, patient also still reporting abdominal pain. Discussed MRI findings. Discussed EEG findings. Discussed that sensation changes could resolve or persist over time. Discussed secondary prevention.   MRI acute left thalamic infarct. Remote L cerebellar infarct, lacunar infarcts.  MRA negative LVO. 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   Vitals:   02/03/22 2210 02/04/22 0014 02/04/22 0215 02/04/22 0415  BP: 139/75 134/76 138/72 138/66  Pulse: 95 100 96 93  Resp: 18 (!) 21 20 (!) 21  Temp: 98.4 F (36.9 C) 98.3 F (36.8 C)  98.2 F (36.8 C)  TempSrc: Oral Oral  Oral  SpO2: 100% 98% 98% 98%  Height:       CBC:  Recent Labs  Lab 02/02/22 2302 02/03/22 0335  WBC  --  6.2  NEUTROABS  --  3.3  HGB 11.9* 10.0*  HCT 35.0* 32.5*  MCV  --  73.7*  PLT  --  [id number]   Basic Metabolic Panel:  Recent Labs  Lab 02/02/22 2302 02/03/22 0335  NA 141 141  K 3.6 3.4*  CL 110 109  CO2  --  24  GLUCOSE 97 93  BUN 24* 20  CREATININE 1.10* 0.99  CALCIUM  --  9.8   Lipid Panel:  Recent Labs  Lab 02/03/22 0335  CHOL 211*  TRIG 90  HDL 62  CHOLHDL 3.4  VLDL 18  LDLCALC 131*   HgbA1c:  Recent Labs  Lab 02/03/22 0335  HGBA1C 4.9   Urine Drug Screen:  Recent Labs  Lab 02/03/22 0707  LABOPIA NONE DETECTED  COCAINSCRNUR NONE DETECTED  LABBENZ NONE DETECTED  AMPHETMU NONE DETECTED  THCU NONE DETECTED  LABBARB NONE DETECTED    Alcohol Level  Recent Labs  Lab 02/03/22 0335  ETH <10     IMAGING past 24 hours CT ABDOMEN PELVIS WO CONTRAST  Result Date: 02/03/2022 CLINICAL DATA:  Acute abdominal pain. EXAM: CT ABDOMEN AND PELVIS WITHOUT CONTRAST TECHNIQUE: Multidetector CT imaging of the abdomen and pelvis was performed following the standard protocol without IV contrast. RADIATION DOSE REDUCTION: This exam was performed according to the departmental dose-optimization program which includes automated exposure control, adjustment of the mA and/or kV according to patient size and/or use of iterative reconstruction technique. COMPARISON:  CT abdomen dated 07/24/2017. FINDINGS: Lower chest: No acute abnormality. Hepatobiliary: No focal liver abnormality is seen. Gallbladder is unremarkable, although not well seen in its entirety due to abutment with the underlying colon. No pericholecystic inflammation or free fluid. No bile duct dilatation is seen. Pancreas: Unremarkable. No pancreatic ductal dilatation or surrounding inflammatory changes. Spleen: Normal in size without focal abnormality. Adrenals/Urinary Tract: Adrenal glands appear normal. Punctate nonobstructing LEFT renal stone versus benign cortical calcification. No hydronephrosis of either kidney. No ureteral or bladder calculi are identified. Bladder appears normal. Stomach/Bowel: No dilated large or small bowel loops. No evidence of bowel wall inflammation. Appendix is normal. Stomach is unremarkable. Vascular/Lymphatic: Aortic atherosclerosis. No abdominal aortic aneurysm. No enlarged lymph nodes  are seen in the abdomen or pelvis. Reproductive: Uterus and bilateral adnexa are unremarkable. Other: No free fluid or abscess collection. No free intraperitoneal air. Musculoskeletal: No acute or suspicious osseous finding. Mild degenerative change within the lower thoracic spine. IMPRESSION: 1. No acute findings within the abdomen or pelvis. No bowel obstruction or evidence of bowel wall inflammation. No evidence of acute solid organ  abnormality. No ureteral calculi. Appendix is normal. 2. Punctate nonobstructing LEFT renal stone versus benign cortical calcification. No hydronephrosis. Aortic Atherosclerosis (ICD10-I70.0). Electronically Signed   By: Bary Richard M.D.   On: 02/03/2022 17:31   DG CHEST PORT 1 VIEW  Result Date: 02/03/2022 CLINICAL DATA:  High blood pressure, RIGHT facial droop, RIGHT-sided sensation change, numbness and weakness RIGHT-side, question stroke EXAM: PORTABLE CHEST 1 VIEW COMPARISON:  Portable exam 1537 hours compared to 07/23/2017 FINDINGS: Enlargement of cardiac silhouette. Mediastinal contours and pulmonary vascularity normal. Atherosclerotic calcification aorta. Lungs clear. No pulmonary infiltrate, pleural effusion, or pneumothorax. Bones demineralized. IMPRESSION: Enlargement of cardiac silhouette. No acute abnormalities. Aortic Atherosclerosis (ICD10-I70.0). Electronically Signed   By: Ulyses Southward M.D.   On: 02/03/2022 16:23   ECHOCARDIOGRAM COMPLETE  Result Date: 02/03/2022    ECHOCARDIOGRAM REPORT   Patient Name:   [facility] Date of Exam: 02/03/2022 Medical Rec #:  [id number]    Height:       57.0 in Accession #:    [id number]   Weight:       113.4 lb Date of Birth:  12-15-1960     BSA:          1.413 m Patient Age:    61 years     BP:           162/99 mmHg Patient Gender: F            HR:           83 bpm. Exam Location:  Inpatient Procedure: 2D Echo, Color Doppler and Cardiac Doppler Indications:    Stroke i63.9  History:        Patient has prior history of Echocardiogram examinations, most                 recent 07/28/2017. Risk Factors:Hypertension.  Sonographer:    Aron Baba Referring Phys: [id number] RONDELL A SMITH IMPRESSIONS  1. Left ventricular ejection fraction, by estimation, is 60 to 65%. The left ventricle has normal function. The left ventricle has no regional wall motion abnormalities. There is mild concentric left ventricular hypertrophy. Left ventricular diastolic parameters are  consistent with Grade I diastolic dysfunction (impaired relaxation).  2. Right ventricular systolic function is normal. The right ventricular size is normal. There is normal pulmonary artery systolic pressure. The estimated right ventricular systolic pressure is 24.3 mmHg.  3. The mitral valve is grossly normal. Trivial mitral valve regurgitation.  4. The aortic valve is tricuspid. There is mild calcification of the aortic valve. There is mild thickening of the aortic valve. Aortic valve regurgitation is mild to moderate. Aortic valve sclerosis is present, with no evidence of aortic valve stenosis.  5. The inferior vena cava is normal in size with greater than 50% respiratory variability, suggesting right atrial pressure of 3 mmHg. Comparison(s): Prior images unable to be directly viewed, comparison made by report only. Similar to prior. FINDINGS  Left Ventricle: Left ventricular ejection fraction, by estimation, is 60 to 65%. The left ventricle has normal function. The left ventricle has no regional wall motion abnormalities. The left ventricular internal cavity  size was normal in size. There is  mild concentric left ventricular hypertrophy. Left ventricular diastolic parameters are consistent with Grade I diastolic dysfunction (impaired relaxation). Right Ventricle: The right ventricular size is normal. No increase in right ventricular wall thickness. Right ventricular systolic function is normal. There is normal pulmonary artery systolic pressure. The tricuspid regurgitant velocity is 2.31 m/s, and  with an assumed right atrial pressure of 3 mmHg, the estimated right ventricular systolic pressure is [id number] mmHg. Left Atrium: Left atrial size was normal in size. Right Atrium: Right atrial size was normal in size. Pericardium: Trivial pericardial effusion is present. The pericardial effusion is circumferential. Mitral Valve: The mitral valve is grossly normal. Trivial mitral valve regurgitation. Tricuspid Valve: The  tricuspid valve is normal in structure. Tricuspid valve regurgitation is not demonstrated. No evidence of tricuspid stenosis. Aortic Valve: The aortic valve is tricuspid. There is mild calcification of the aortic valve. There is mild thickening of the aortic valve. There is mild aortic valve annular calcification. Aortic valve regurgitation is mild to moderate. Aortic regurgitation PHT measures 405 msec. Aortic valve sclerosis is present, with no evidence of aortic valve stenosis. Pulmonic Valve: The pulmonic valve was normal in structure. Pulmonic valve regurgitation is mild to moderate. No evidence of pulmonic stenosis. Aorta: The aortic root and ascending aorta are structurally normal, with no evidence of dilitation. Venous: The inferior vena cava is normal in size with greater than 50% respiratory variability, suggesting right atrial pressure of 3 mmHg. IAS/Shunts: No atrial level shunt detected by color flow Doppler.  LEFT VENTRICLE PLAX 2D LVIDd:         4.00 cm   Diastology LVIDs:         3.00 cm   LV e' medial:    4.90 cm/s LV PW:         1.00 cm   LV E/e' medial:  5.8 LV IVS:        1.10 cm   LV e' lateral:   3.15 cm/s LVOT diam:     2.00 cm   LV E/e' lateral: 9.0 LV SV:         46 LV SV Index:   33 LVOT Area:     3.14 cm  RIGHT VENTRICLE RV Basal diam:  3.20 cm RV Mid diam:    2.80 cm RV S prime:     14.40 cm/s TAPSE (M-mode): 1.1 cm LEFT ATRIUM             Index        RIGHT ATRIUM          Index LA Vol (A2C):   31.8 ml 22.50 ml/m  RA Area:     9.04 cm LA Vol (A4C):   21.5 ml 15.21 ml/m  RA Volume:   15.60 ml 11.04 ml/m LA Biplane Vol: 26.4 ml 18.68 ml/m  AORTIC VALVE             PULMONIC VALVE LVOT Vmax:   86.30 cm/s  PR End Diast Vel: 5.29 msec LVOT Vmean:  55.800 cm/s LVOT VTI:    0.148 m AI PHT:      405 msec  AORTA Ao Root diam: 3.50 cm Ao Asc diam:  3.80 cm MITRAL VALVE                TRICUSPID VALVE MV Area (PHT): 4.06 cm     TR Peak grad:   21.3 mmHg MV Decel Time: 187 msec     TR  Vmax:         231.00 cm/s MV E velocity: 28.45 cm/s MV A velocity: 129.00 cm/s  SHUNTS MV E/A ratio:  0.22         Systemic VTI:  0.15 m                             Systemic Diam: 2.00 cm Riley Lam MD Electronically signed by Riley Lam MD Signature Date/Time: 02/03/2022/3:20:20 PM    Final     PHYSICAL EXAM  Physical Exam  Constitutional: Appears frail malnourished looking middle-aged [place] lady Cardiovascular: Normal rate and regular rhythm.  Respiratory: Effort normal, non-labored breathing  Neuro: Mental Status: Patient is awake, alert. Difficult to assess orientation secondary to language barrier.  No signs of aphasia or neglect Cranial Nerves: II: PERRL III,IV, VI: EOMI without ptosis or diploplia.  V: Facial sensation is decreased to light touch on the right.  VII: Facial movement is symmetric resting and smiling VIII: Hearing is intact to voice XI: Shoulder shrug is symmetric. XII: Tongue protrudes midline without atrophy or fasciculations.  Motor: Tone is normal. Bulk is normal. 5/5 strength was present in all four extremities.  Sensory: Sensation is decreased in RUE and RLE.   ASSESSMENT/PLAN Ms. Kashvi Krus is a 61 y.o. 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 MRI with acute L thalamic infarct and BP >220 on admission. Suspect thalamic infarct caused by small vessel disease due to elevated BP on admission and elevated LDL. Place on ASA and plavix for 3 weeks then ASA monotherapy for secondary prevention. Discussed aggressive control of risk factors including  HTN, HLD, and lifestyle changes.   Stroke  acute left thalamic infarct  Etiology:  small vessel disease  Code Stroke CT head No acute abnormality. Small vessel disease.  MRI  acute left thalamic infarct. Remote L cerebellar infarct, lacunar infarcts. MRA  negative LVO. 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  2D Echo LVEF 60-65%. Mild to moderate AR. No shunt.  LDL 131 HgbA1c 4.9 EEG unremarkable  No seizures or epileptiform discharges VTE prophylaxis - lovenox    Diet   Diet Heart Room service appropriate? 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  Microhemorrhages on MRI and h/o spontaneous intraventricular hemorrhage   H/o stroke  Spontaneous intraventricular hemorrhage in 2019, etiology likely hypertensive though no definite source   Other Active Problems Chest/back pain with nonobstructing L renal stone on CT  Proteinuria - noted since 2019  Hypokalemia s/p repletion   Hospital day # 1  2020, MD,  PGY-1  STROKE MD NOTE :  I have personally obtained history,examined this patient, reviewed notes, independently viewed imaging studies, participated in medical decision making and plan of care.ROS completed by me personally and pertinent positives fully documented  I have made any additions or clarifications directly to the above note. Agree with note above.  Patient does not speak any English and  her daughter was available over the speaker phone and translated for her to facilitate this visit.  She presented with sudden onset of right face and hand numbness which is improved but not completely.  MRI scan shows small left thalamic lacunar infarct likely from small vessel disease.  Neurovascular imaging i shows mild diffuse intracranial atherosclerosis..  Recommend aspirin and Plavix for 3 weeks followed by aspirin alone and aggressive risk factor modification. Check echocardiogram.  Mobilize out of bed.  Therapy consults.  Discussed with patient, daughter and Dr. Sharon Seller.  Greater than 50% time during this 50-minute visit was spent in counseling and coordination of care about her lacunar stroke and discussion about evaluation and treatment and answering questions.  Delia Heady, MD Medical Director [facility] Pager: [phone] 02/04/2022 2:29 PM  To contact Stroke Continuity provider, please refer to [email or web address]. After hours, contact General Neurology

## 2022-02-04 NOTE — Plan of Care (Signed)
  Problem: Safety: Goal: Ability to remain free from injury will improve Outcome: Progressing   Problem: Skin Integrity: Goal: Risk for impaired skin integrity will decrease Outcome: Progressing   Problem: Self-Care: Goal: Ability to participate in self-care as condition permits will improve Outcome: Progressing   

## 2022-02-05 ENCOUNTER — Other Ambulatory Visit: Payer: Self-pay

## 2022-02-05 DIAGNOSIS — R109 Unspecified abdominal pain: Secondary | ICD-10-CM | POA: Diagnosis not present

## 2022-02-05 DIAGNOSIS — R569 Unspecified convulsions: Secondary | ICD-10-CM | POA: Diagnosis not present

## 2022-02-05 DIAGNOSIS — D509 Iron deficiency anemia, unspecified: Secondary | ICD-10-CM | POA: Diagnosis not present

## 2022-02-05 DIAGNOSIS — E7800 Pure hypercholesterolemia, unspecified: Secondary | ICD-10-CM | POA: Diagnosis not present

## 2022-02-05 DIAGNOSIS — I639 Cerebral infarction, unspecified: Secondary | ICD-10-CM | POA: Diagnosis not present

## 2022-02-05 LAB — BASIC METABOLIC PANEL
Anion gap: 8 (ref 5–15)
BUN: 14 mg/dL (ref 8–23)
CO2: 21 mmol/L — ABNORMAL LOW (ref 22–32)
Calcium: 9.6 mg/dL (ref 8.9–10.3)
Chloride: 113 mmol/L — ABNORMAL HIGH (ref 98–111)
Creatinine, Ser: 0.93 mg/dL (ref 0.44–1.00)
GFR, Estimated: >60 mL/min (ref >60)
Glucose, Bld: 91 mg/dL (ref 70–99)
Potassium: 4 mmol/L (ref 3.5–5.1)
Sodium: 142 mmol/L (ref 135–145)

## 2022-02-05 LAB — RETICULOCYTES
Immature Retic Fract: 18.7 % — ABNORMAL HIGH (ref 2.3–15.9)
RBC.: 4.03 MIL/uL (ref 3.87–5.11)
Retic Count, Absolute: 82.2 10*3/uL (ref 19.0–186.0)
Retic Ct Pct: 2 % (ref 0.4–3.1)

## 2022-02-05 LAB — IRON AND TIBC
Iron: 47 ug/dL (ref 28–170)
Saturation Ratios: 18 % (ref 10.4–31.8)
TIBC: 260 ug/dL (ref 250–450)
UIBC: 213 ug/dL

## 2022-02-05 LAB — CBC
HCT: 29.4 % — ABNORMAL LOW (ref 36.0–46.0)
Hemoglobin: 9.3 g/dL — ABNORMAL LOW (ref 12.0–15.0)
MCH: 22.8 pg — ABNORMAL LOW (ref 26.0–34.0)
MCHC: 31.6 g/dL (ref 30.0–36.0)
MCV: 72.1 fL — ABNORMAL LOW (ref 80.0–100.0)
Platelets: 191 10*3/uL (ref 150–400)
RBC: 4.08 MIL/uL (ref 3.87–5.11)
RDW: 15.3 % (ref 11.5–15.5)
WBC: 7.6 10*3/uL (ref 4.0–10.5)
nRBC: 0 % (ref 0.0–0.2)

## 2022-02-05 LAB — VITAMIN B12: Vitamin B-12: 520 pg/mL (ref 180–914)

## 2022-02-05 LAB — FOLATE: Folate: 15.1 ng/mL (ref >5.9)

## 2022-02-05 LAB — FERRITIN: Ferritin: 144 ng/mL (ref 11–307)

## 2022-02-05 MED ORDER — CLOPIDOGREL BISULFATE 75 MG PO TABS
75.0000 mg | ORAL_TABLET | Freq: Every day | ORAL | 0 refills | Status: AC
  Filled 2022-02-05: qty 18, 18d supply, fill #0

## 2022-02-05 MED ORDER — ASPIRIN 81 MG PO TBEC
81.0000 mg | DELAYED_RELEASE_TABLET | Freq: Every day | ORAL | 12 refills | Status: AC
  Filled 2022-02-05: qty 30, 30d supply, fill #0

## 2022-02-05 MED ORDER — ATORVASTATIN CALCIUM 80 MG PO TABS
80.0000 mg | ORAL_TABLET | Freq: Every day | ORAL | 2 refills | Status: AC
  Filled 2022-02-05: qty 30, 30d supply, fill #0

## 2022-02-05 NOTE — Progress Notes (Addendum)
STROKE TEAM PROGRESS NOTE   INTERVAL HISTORY Afebrile. Bps 140-160s. HR 90s. K 4. LDL 131. Hgb 9.3.   Patient's daughter was not available. Patient pointed at right face to indicate ongoing numbness. Neurological exam unchanged.   MRI acute left thalamic infarct. Remote L cerebellar infarct, lacunar infarcts.  MRA negative LVO. 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   Vitals:   02/04/22 1530 02/04/22 2101 02/05/22 0022 02/05/22 0315  BP: (!) 160/73 (!) 180/85 (!) 164/85 (!) 146/92  Pulse: 84 76 83 88  Resp: 16 18 20 17   Temp:  98.3 F (36.8 C) 98.3 F (36.8 C) 98.3 F (36.8 C)  TempSrc: Oral Oral Oral Oral  SpO2: 99% 99% 100% 98%  Height:       CBC:  Recent Labs  Lab 02/03/22 0335 02/05/22 0403  WBC 6.2 7.6  NEUTROABS 3.3  --   HGB 10.0* 9.3*  HCT 32.5* 29.4*  MCV 73.7* 72.1*  PLT 185 191   Basic Metabolic Panel:  Recent Labs  Lab 02/03/22 0335 02/05/22 0403  NA 141 142  K 3.4* 4.0  CL 109 113*  CO2 24 21*  GLUCOSE 93 91  BUN 20 14  CREATININE 0.99 0.93  CALCIUM 9.8 9.6   Lipid Panel:  Recent Labs  Lab 02/03/22 0335  CHOL 211*  TRIG 90  HDL 62  CHOLHDL 3.4  VLDL 18  LDLCALC 02/05/22*   HgbA1c:  Recent Labs  Lab 02/03/22 0335  HGBA1C 4.9   Urine Drug Screen:  Recent Labs  Lab 02/03/22 0707  LABOPIA NONE DETECTED  COCAINSCRNUR NONE DETECTED  LABBENZ NONE DETECTED  AMPHETMU NONE DETECTED  THCU NONE DETECTED  LABBARB NONE DETECTED    Alcohol Level  Recent Labs  Lab 02/03/22 0335  ETH <10    IMAGING past 24 hours EEG adult  Result Date: 02/04/2022 02/06/2022, MD     02/04/2022  8:42 AM Patient Name: Jaisa Defino MRN: [facility] Attending: [id number] Referring Physician/Provider: Charlsie Quest, MD Date: 02/03/2022 Duration: 22.18 mins Patient history: 61 year old female with left thalamic infarct and t subsequent right facial, arm and leg twitching with possibility of speech. EEG to evaluate for  seizure. Level of alertness: Awake AEDs during EEG study: None Technical aspects: This EEG study was done with scalp electrodes positioned according to the 10-20 International system of electrode placement. Electrical activity was reviewed with band pass filter of 1-70Hz , sensitivity of 7 uV/mm, display speed of 51mm/sec with a 60Hz  notched filter applied as appropriate. EEG data were recorded continuously and digitally stored.  Video monitoring was available and reviewed as appropriate. Description: The posterior dominant rhythm consists of 9 Hz activity of moderate voltage (25-35 uV) seen predominantly in posterior head regions, symmetric and reactive to eye opening and eye closing. Hyperventilation and photic stimulation were not performed.   IMPRESSION: This study is within normal limits. No seizures or epileptiform discharges were seen throughout the recording. A normal interictal EEG does not exclude nor support the diagnosis of epilepsy. Priyanka O Yadav    PHYSICAL EXAM  Physical Exam  Constitutional: Appears frail malnourished looking middle-aged 29m lady Cardiovascular: Normal rate and regular rhythm.  Respiratory: Effort normal, non-labored breathing  Neuro: Mental Status: Patient is awake, alert. Difficult to assess orientation secondary to language barrier.  No signs of aphasia or neglect Cranial Nerves: III,IV, VI: EOMI without ptosis or diploplia.  V: Facial sensation is decreased on the right.  VII:  Facial movement is symmetric resting and smiling VIII: Hearing is intact to voice XI: Shoulder shrug is symmetric. XII: Tongue protrudes midline without atrophy or fasciculations.  Motor: Tone is normal. Bulk is normal. 5/5 strength was present in all four extremities.  Sensory: Sensation is decreased in RUE and RLE.   ASSESSMENT/PLAN Ms. Jerome Drost is a 61 y.o. 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 MRI with acute L thalamic infarct and BP >220 on admission. There was also concern for focal seizure, EEG was unremarkable. Suspect thalamic infarct caused by small vessel disease due to elevated BP on admission and elevated LDL. Place on ASA and plavix for 3 weeks then ASA monotherapy for secondary prevention. Discussed aggressive control of risk factors including HTN, HLD, and lifestyle changes.   Stroke  acute left thalamic infarct  Etiology:  small vessel disease  Code Stroke CT head No acute abnormality. Small vessel disease.  MRI  acute left thalamic infarct. Remote L cerebellar infarct, lacunar infarcts. MRA  negative LVO. 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  2D Echo LVEF 60-65%. Mild to moderate AR. No shunt.  LDL 131 HgbA1c 4.9 EEG unremarkable  No seizures or epileptiform discharges VTE prophylaxis - lovenox    Diet   Diet Heart Room service appropriate? 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  Microhemorrhages on MRI and h/o spontaneous intraventricular hemorrhage   H/o stroke   Spontaneous intraventricular hemorrhage in 2019, etiology likely hypertensive though no definite source   Other Active Problems Chest/back pain with nonobstructing L renal stone on CT  Proteinuria - noted since 2019  Hypokalemia s/p repletion   Hospital day # 2  2020, MD, PGY-1  I have personally obtained history,examined this patient, reviewed notes, independently viewed imaging studies, participated in medical decision making and plan of care.ROS completed by me personally and pertinent positives fully documented  I have made any additions or clarifications directly to the above note. Agree with note above.  Patient continues to have mild right face numbness expect improvement over time.  Continue aspirin Plavix for 3 weeks followed by aspirin alone and aggressive risk factor modification.  Stroke team will sign off.  Kindly call for questions.  Karie Fetch, MD Medical Director [facility] Pager: [phone] 02/05/2022 1:00 PM   To contact Stroke Continuity provider, please refer to 02/07/2022. After hours, contact General Neurology

## 2022-02-05 NOTE — Progress Notes (Signed)
Occupational Therapy Treatment Patient Details Name: Diane Berry MRN: [id number] DOB: 01-Sep-1960 Today's Date: 02/05/2022   History of present illness Pt is a 61 y/o female admitted secondary to R weakness. Found to have L thalamic infarct. PMH includes HTN and intracranial hemmorhage.   OT comments  Pt progressing towards established OT goals. Pt continues to present with R sided deficits in strength, sensation, and coordination. Pt performing grooming with min guard-min A. Pt initially demonstrating incoordination with RUE during oral care, but switching to L hand and difficulty helping pt understand need to use R hand for rehabilitation purposes despite use of on phone interpreter. Pt performing functional mobility in hall with min A due to decreased balance. Decreased attention to commands to perform path finding back to room and walking past room. Will continue to follow acutely and recommend OP OT to optimize safety and independence in ADL and IADL.    Recommendations for follow up therapy are one component of a multi-disciplinary discharge planning process, led by the attending physician.  Recommendations may be updated based on patient status, additional functional criteria and insurance authorization.    Follow Up Recommendations  Outpatient OT    Assistance Recommended at Discharge Frequent or constant Supervision/Assistance  Patient can return home with the following  A little help with walking and/or transfers;A little help with bathing/dressing/bathroom;Assistance with cooking/housework;Direct supervision/assist for medications management;Direct supervision/assist for financial management;Assist for transportation;Help with stairs or ramp for entrance   Equipment Recommendations  BSC/3in1    Recommendations for Other Services      Precautions / Restrictions Precautions Precautions: Fall Precaution Comments: Impulsive Restrictions Weight Bearing Restrictions: No        Mobility Bed Mobility Overal bed mobility: Needs Assistance Bed Mobility: Supine to Sit, Sit to Supine     Supine to sit: Supervision Sit to supine: Supervision   General bed mobility comments: Pt able to transition to/from EOB without assistance. Pt attempting to manage lines and linen however required therapist assist to effectively manage this these things.    Transfers Overall transfer level: Needs assistance Equipment used: None Transfers: Sit to/from Stand Sit to Stand: Min guard           General transfer comment: Close hands on guarding for safety as pt powered up to full stand. No assist required.     Balance Overall balance assessment: Needs assistance Sitting-balance support: No upper extremity supported, Feet supported Sitting balance-Leahy Scale: Good Sitting balance - Comments: maintaining long sit in bed on arrival and departure. Also donning socks with no back support in long sit on bed.   Standing balance support: Bilateral upper extremity supported, No upper extremity supported Standing balance-Leahy Scale: Fair Standing balance comment: Could static stand without support                           ADL either performed or assessed with clinical judgement   ADL Overall ADL's : Needs assistance/impaired     Grooming: Oral care;Minimal assistance;Standing Grooming Details (indicate cue type and reason): Min A for standing balance. Initially brushing teeth with R hand and in supinated position, but when OT attempting to correct, pt switching toothbrush to LUE             Lower Body Dressing: Supervision/safety;Bed level Lower Body Dressing Details (indicate cue type and reason): Donning socks sitting in long sit on bed Toilet Transfer: Minimal assistance;Ambulation Toilet Transfer Details (indicate cue type and reason): simulated  Functional mobility during ADLs: Minimal assistance General ADL Comments: would likely do better  with RW; needs assistnace during ADL tasks to reduce risk of falls    Extremity/Trunk Assessment Upper Extremity Assessment Upper Extremity Assessment: RUE deficits/detail RUE Deficits / Details: Pt attempting to brush teeth with palm supinated. appearing "clumsy", and frequent attempts to compensate   Lower Extremity Assessment Lower Extremity Assessment: Defer to PT evaluation        Vision   Vision Assessment?: Vision impaired- to be further tested in functional context Additional Comments: Pt appearing to have difficulty locating items. Unable to find toboggan without cues after having placed it on tray table prior to grooming.   Perception     Praxis      Cognition Arousal/Alertness: Awake/alert Behavior During Therapy: Impulsive Overall Cognitive Status: Difficult to assess Area of Impairment: Safety/judgement, Following commands, Attention                   Current Attention Level: Sustained, Focused   Following Commands: Follows one step commands inconsistently Safety/Judgement: Decreased awareness of safety, Decreased awareness of deficits     General Comments: Pt moving quickly, and decreased ability to attempt to interpret gestural cues (getting interpreter on phone was helpful). Pt with good problem solving to compensate, but decreased insight into need to use RUE to facilitate improved functionality despite explanation and use of interpreter for greater understanding. Potentially decreased memory as could not remember where she placed toboggan prior to grooming.        Exercises      Shoulder Instructions       General Comments      Pertinent Vitals/ Pain       Pain Assessment Pain Assessment: Faces Faces Pain Scale: No hurt  Home Living                                          Prior Functioning/Environment              Frequency  Min 2X/week        Progress Toward Goals  OT Goals(current goals can now be  found in the care plan section)  Progress towards OT goals: Progressing toward goals  Acute Rehab OT Goals Patient Stated Goal: Pt requesting drink with gestural cues. No specific requests when on phone with interpreter OT Goal Formulation: With patient Time For Goal Achievement: 02/17/22 Potential to Achieve Goals: Good ADL Goals Pt Will Perform Grooming: with modified independence Pt Will Perform Lower Body Bathing: with modified independence;sit to/from stand Pt Will Perform Lower Body Dressing: with modified independence;sit to/from stand Pt Will Transfer to Toilet: with modified independence;ambulating Pt/caregiver will Perform Home Exercise Program: Increased strength;Right Upper extremity;With written HEP provided;With Supervision  Plan Discharge plan remains appropriate    Co-evaluation    PT/OT/SLP Co-Evaluation/Treatment: Yes Reason for Co-Treatment: Necessary to address cognition/behavior during functional activity;For patient/therapist safety;To address functional/ADL transfers PT goals addressed during session: Mobility/safety with mobility OT goals addressed during session: ADL's and self-care;Strengthening/ROM      AM-PAC OT "6 Clicks" Daily Activity     Outcome Measure   Help from another person eating meals?: A Little Help from another person taking care of personal grooming?: A Little Help from another person toileting, which includes using toliet, bedpan, or urinal?: A Little Help from another person bathing (including washing, rinsing, drying)?: A Little Help from  another person to put on and taking off regular upper body clothing?: A Little Help from another person to put on and taking off regular lower body clothing?: A Little 6 Click Score: 18    End of Session Equipment Utilized During Treatment: Gait belt  OT Visit Diagnosis: Unsteadiness on feet (R26.81);Other abnormalities of gait and mobility (R26.89);Muscle weakness (generalized) (M62.81)    Activity Tolerance Patient tolerated treatment well   Patient Left in bed;with call bell/phone within reach;with bed alarm set   Nurse Communication Mobility status        Time: 9390-3009 OT Time Calculation (min): 23 min  Charges: OT General Charges $OT Visit: 1 Visit OT Treatments $Self Care/Home Management : 8-22 mins  Ladene Artist, OTR/L [facility] Acute Rehabilitation Office: [phone]   Drue Novel 02/05/2022, 4:24 PM

## 2022-02-05 NOTE — Discharge Summary (Signed)
DISCHARGE SUMMARY  Diane Berry  MR#: [id number]  DOB:1960/12/24  Date of Admission: 02/02/2022 Date of Discharge: 02/05/2022  Attending Physician:Shakeia Krus Silvestre Gunner, MD  Patient's KDT:OIZTIWP, Diane Scales, NP  Consults: Neurology   Disposition: D/C home   Follow-up Appts:  Follow-up Information     [facility] Follow up.   Why: Your outpatient therapy has been set up. The office will call you to set up start of service. Please call number listed above for any questions or concerns. Contact information: [facility] at [facility] [address] [place]  [phone]        Micki Riley, MD Follow up in 7 week(s).   Specialties: Neurology, Radiology Contact information: [address] [place] [address] [phone]                 Tests Needing Follow-up: -monitor BP closely - goal is strict control -recheck LFTs and lipids in 8-12 weeks w/ new statin Rx -assure pt is compliant w/ prescription medications -follow anemia and assure patient up to date w/ routine health screening/colonscopy  Discharge Diagnoses: Small Acute ischemic thalamic CVA Hypertensive urgency Seizure-like activity Cognitive decline Hypokalemia Microcytic hypochromic anemia Chest abdominal and back pain on ROS - unclear etiology  HLD  Initial presentation: 61 year old with a history of HTN, HLD, monocular blindness, and a intraventricular hemorrhage in 2019 who presented to the ER with a 3-day history of right-sided facial droop and numbness of the right arm.  The patient was a code stroke on presentation. CT head did not reveal any acute abnormality but did note chronic small vessel ischemic change. The patient's presenting blood pressure was 228/110.  MRI of the brain noted an 8 mm acute ischemic nonhemorrhagic left thalamic infarct with multiple prior microvascular ischemic infarcts and multiple chronic  microhemorrhages.  Hospital Course:  Small Acute ischemic thalamic CVA DAPT with aspirin plus Plavix x 21 days followed by aspirin alone - LDL 131 therefore statin initiated - A1c 4.9 - no acute related findings on TTE  - likely due to SVD with uncontrolled blood pressure - Therapy recommended outpatient PT   Hypertensive urgency BP 228/110 on admission -careful lowered allowing for permissive hypertension - f/u in outpt setting w/ aggressive long term control indicated    Seizure-like activity Further history reveals episodes of possible convulsive behavior -observed for w/o recurrence during this admission - Neurology recommended against initiation of seizure medicine presently   Cognitive decline 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 related to hypertension   Hypokalemia Mild - corrected w/ supplementation    Microcytic hypochromic anemia Hemoglobin 10.0 which appears to be her baseline - anemia panel most c/w chronic disease pattern, likely related to poor nutrition - outpt monitoring and routine health screening indicated    Chest abdominal and back pain on ROS No acute findings on CT abdomen/pelvis -no acute findings on CXR - appears to be resolved at time of d/c    HLD LDL 131 -patient was noncompliant with atorvastatin at home - statin newly re-prescribed this admit w/ counseling on need for complicance  Allergies as of 02/05/2022   No Known Allergies      Medication List     TAKE these medications    acetaminophen 500 MG tablet Commonly known as: TYLENOL Take 500 mg by mouth every 6 (six) hours as needed for moderate pain or mild pain.   aspirin EC 81 MG tablet Take 1 tablet (81 mg total)  by mouth daily. Swallow whole. Start taking on: February 06, 2022   atorvastatin 80 MG tablet Commonly known as: LIPITOR Take 1 tablet (80 mg total) by mouth daily. Start taking on: February 06, 2022   clopidogrel 75 MG tablet Commonly known as: PLAVIX Take 1  tablet (75 mg total) by mouth daily. Start taking on: February 06, 2022               Durable Medical Equipment  (From admission, onward)           Start     Ordered   02/05/22 1007  For home use only DME Bedside commode  Once       Question:  Patient needs a bedside commode to treat with the following condition  Answer:  Acute ischemic stroke (HCC)   02/05/22 1008   02/05/22 0957  For home use only DME Walker rolling  Once       Question Answer Comment  Walker: With 5 Inch Wheels   Patient needs a walker to treat with the following condition Acute ischemic stroke (HCC)      02/05/22 1008            Day of Discharge BP (!) 140/78 (BP Location: Right Arm)   Pulse 87   Temp 98.1 F (36.7 C) (Oral)   Resp 20   Ht 4\' 9"  (1.448 m)   SpO2 100%   BMI 24.54 kg/m   Physical Exam: General: No acute respiratory distress Lungs: Clear to auscultation bilaterally without wheezes or crackles Cardiovascular: Regular rate and rhythm without murmur  Abdomen: Nontender, nondistended, soft, bowel sounds positive Extremities: No significant cyanosis, clubbing, or edema bilateral lower extremities  Basic Metabolic Panel: Recent Labs  Lab 02/02/22 2302 02/03/22 0335 02/05/22 0403  NA 141 141 142  K 3.6 3.4* 4.0  CL 110 109 113*  CO2  --  24 21*  GLUCOSE 97 93 91  BUN 24* 20 14  CREATININE 1.10* 0.99 0.93  CALCIUM  --  9.8 9.6    CBC: Recent Labs  Lab 02/02/22 2302 02/03/22 0335 02/05/22 0403  WBC  --  6.2 7.6  NEUTROABS  --  3.3  --   HGB 11.9* 10.0* 9.3*  HCT 35.0* 32.5* 29.4*  MCV  --  73.7* 72.1*  PLT  --  185 191    Time spent in discharge (includes decision making & examination of pt): 35 minutes  02/05/2022, 3:13 PM   02/07/2022, MD [facility] Office  [phone]

## 2022-02-05 NOTE — TOC Progression Note (Signed)
Transition of Care [facility]) - Progression Note    Patient Details  Name: Diane Berry MRN: [id number] Date of Birth: 15-Jun-1960  [facility] CM/SW Contact  Beckie Busing, RN Phone Number:[phone]  02/05/2022, 10:15 AM  Clinical Narrative:    TOC following for disposition needs. CM spoke with daughter Diane Berry @ [phone]. Daughter speaks English and verbalizes understanding to Sports coach. CM made daughter aware of outpatient therapy recommendation and daughter is agreeable and ok with CM setting up outpatient therapy. Daughter made aware that DME has been ordered through [employer] and will be delivered to the bedside. No other needs noted at this time.         Expected Discharge Plan and Services                                                 Social Determinants of Health (SDOH) Interventions    Readmission Risk Interventions     No data to display

## 2022-02-05 NOTE — Progress Notes (Signed)
Physical Therapy Treatment Patient Details Name: Diane Berry MRN: [id number] DOB: Jul 08, 1960 Today's Date: 02/05/2022   History of Present Illness Pt is a 61 y/o female admitted secondary to R weakness. Found to have L thalamic infarct. PMH includes HTN and intracranial hemmorhage.    PT Comments    Pt progressing towards physical therapy goals. In-person interpreter not available this date, however interpreter via telephone utilized. Cognition continues to be difficult to assess due to the language barrier; even with interpreter use it is difficult. My impression is that pt is not cognitively at baseline. Consistent min assist provided throughout gait training, and recommend frequent assist/supervision at home for safety. Will continue to follow and progress as able per POC.   Of note, in-person interpreter scheduled tomorrow at 11:15 for therapies.    Recommendations for follow up therapy are one component of a multi-disciplinary discharge planning process, led by the attending physician.  Recommendations may be updated based on patient status, additional functional criteria and insurance authorization.  Follow Up Recommendations  Outpatient PT     Assistance Recommended at Discharge Frequent or constant Supervision/Assistance  Patient can return home with the following A little help with walking and/or transfers;A little help with bathing/dressing/bathroom;Assistance with cooking/housework;Assistance with feeding;Help with stairs or ramp for entrance;Assist for transportation   Equipment Recommendations  Rolling walker (2 wheels);BSC/3in1    Recommendations for Other Services       Precautions / Restrictions Precautions Precautions: Fall Precaution Comments: Impulsive Restrictions Weight Bearing Restrictions: No     Mobility  Bed Mobility Overal bed mobility: Needs Assistance Bed Mobility: Supine to Sit, Sit to Supine     Supine to sit: Supervision Sit to supine:  Supervision   General bed mobility comments: Pt able to transition to/from EOB without assistance. Pt attempting to manage lines and linen however required therapist assist to effectively manage this these things.    Transfers Overall transfer level: Needs assistance Equipment used: None Transfers: Sit to/from Stand Sit to Stand: Min guard           General transfer comment: Close hands on guarding for safety as pt powered up to full stand. No assist required.    Ambulation/Gait Ambulation/Gait assistance: Min assist Gait Distance (Feet): 200 Feet Assistive device: None, 1 person hand held assist Gait Pattern/deviations: Step-through pattern, Decreased stride length, Knees buckling, Scissoring, Staggering right Gait velocity: Inconsistent Gait velocity interpretation: 1.31 - 2.62 ft/sec, indicative of limited community ambulator   General Gait Details: Intermittent HHA for balance support and safety. Pt with staggering R at times and RLE incoordination.   Stairs             Wheelchair Mobility    Modified Rankin (Stroke Patients Only) Modified Rankin (Stroke Patients Only) Pre-Morbid Rankin Score: No significant disability Modified Rankin: Moderately severe disability     Balance Overall balance assessment: Needs assistance Sitting-balance support: No upper extremity supported, Feet supported Sitting balance-Leahy Scale: Good     Standing balance support: Bilateral upper extremity supported, No upper extremity supported Standing balance-Leahy Scale: Fair Standing balance comment: Could static stand without support                            Cognition Arousal/Alertness: Awake/alert Behavior During Therapy: Impulsive Overall Cognitive Status: Difficult to assess Area of Impairment: Safety/judgement, Following commands, Attention                   Current Attention Level:  Sustained, Focused   Following Commands: Follows one step  commands inconsistently Safety/Judgement: Decreased awareness of safety, Decreased awareness of deficits              Exercises      General Comments        Pertinent Vitals/Pain Pain Assessment Pain Assessment: Faces Faces Pain Scale: No hurt    Home Living                          Prior Function            PT Goals (current goals can now be found in the care plan section) Acute Rehab PT Goals Patient Stated Goal: Go home PT Goal Formulation: Patient unable to participate in goal setting Time For Goal Achievement: 02/17/22 Potential to Achieve Goals: Good Progress towards PT goals: Progressing toward goals    Frequency    Min 4X/week      PT Plan Current plan remains appropriate    Co-evaluation PT/OT/SLP Co-Evaluation/Treatment: Yes Reason for Co-Treatment: Necessary to address cognition/behavior during functional activity;For patient/therapist safety;To address functional/ADL transfers PT goals addressed during session: Mobility/safety with mobility;Balance        AM-PAC PT "6 Clicks" Mobility   Outcome Measure  Help needed turning from your back to your side while in a flat bed without using bedrails?: A Little Help needed moving from lying on your back to sitting on the side of a flat bed without using bedrails?: A Little Help needed moving to and from a bed to a chair (including a wheelchair)?: A Little Help needed standing up from a chair using your arms (e.g., wheelchair or bedside chair)?: A Little Help needed to walk in hospital room?: A Little Help needed climbing 3-5 steps with a railing? : A Little 6 Click Score: 18    End of Session Equipment Utilized During Treatment: Gait belt Activity Tolerance: Patient tolerated treatment well Patient left: in bed;with call bell/phone within reach;with bed alarm set;with family/visitor present Nurse Communication: Mobility status PT Visit Diagnosis: Unsteadiness on feet (R26.81);Other  abnormalities of gait and mobility (R26.89);Muscle weakness (generalized) (M62.81)     Time: 8182-9937 PT Time Calculation (min) (ACUTE ONLY): 21 min  Charges:  $Gait Training: 8-22 mins                     Diane Berry, PT, DPT Acute Rehabilitation Services Secure Chat Preferred Office: [phone]    Diane Berry 02/05/2022, 4:07 PM

## 2022-02-10 ENCOUNTER — Telehealth: Payer: Self-pay

## 2022-02-10 NOTE — Telephone Encounter (Addendum)
Transition Care Management Unsuccessful Follow-up Telephone Call  Call completed with assistance of Elissa Lovett interpreter, Mr Luberta Robertson language resources.   Date of discharge and from where:  02/05/2022, [facility]  Attempts:  1st Attempt  Reason for unsuccessful TCM follow-up call:  Voice mail full-  [phone]. This is also the phone number for patient's daughter, Channing Mutters.  Need to discuss scheduling a hospital follow up appointment. PCP is listed as Gwinda Passe, NP but she has not seen the patient in over 2 years.

## 2022-02-11 ENCOUNTER — Telehealth: Payer: Self-pay

## 2022-02-11 NOTE — Telephone Encounter (Signed)
Transition Care Management Unsuccessful Follow-up Telephone Call  Call completed with assistance of Elissa Lovett interpreter, Mr Luberta Robertson language resources.   Date of discharge and from where:  02/05/2022, [facility]   Attempts:  2nd Attempt  Reason for unsuccessful TCM follow-up call:  Voice mail full- [phone]. This is also the phone number for patient's daughter, Channing Mutters.   Need to discuss scheduling a hospital follow up appointment. PCP is listed as Gwinda Passe, NP but she has not seen the patient in over 2 years.

## 2022-02-12 ENCOUNTER — Ambulatory Visit: Payer: No Typology Code available for payment source | Attending: Internal Medicine

## 2022-02-12 ENCOUNTER — Telehealth: Payer: Self-pay

## 2022-02-12 ENCOUNTER — Ambulatory Visit: Payer: No Typology Code available for payment source | Admitting: Occupational Therapy

## 2022-02-12 NOTE — Telephone Encounter (Signed)
Transition Care Management Follow-up Telephone Call  Call placed with assistance of Cecil Cranker interpreter/ Language Resources.   Patient's daughter, Diane Berry, answered and she speaks [place]. DPR on file to speak with Diane Berry.  Date of discharge and from where: 02/05/2022,  [facility] How have you been since you were released from the hospital? She said her mother is doing good; but still has some numbness in her hand. They live together and Diane Berry has been checking her mother's BP and helping with ADLs.  Any questions or concerns? Yes- Diane Berry said she submitted a Medicaid application for her mother and she is waiting to hear if she has been approved.  They recently moved to [facility] and would like to establish care closer to home. Most recent PCP is at [facility].  She has been referred to outpatient neurorehab and had an appointment there today but Diane Berry said her mother didn't want to go because it was too early in the morning. I encouraged her to call and request an appointment for rehab later in the day.   Items Reviewed: Did the pt receive and understand the discharge instructions provided? Yes  Medications obtained and verified? Yes - Diane Berry said she has all of her medications and she did not have any questions about the med regime.  Other? No  Any new allergies since your discharge? No  Dietary orders reviewed? No Do you have support at home? Yes   Home Care and Equipment/Supplies: Were home health services ordered? no If so, what is the name of the agency? N/a  Has the agency set up a time to come to the patient's home? not applicable Were any new equipment or medical supplies ordered?  No What is the name of the medical supply agency? N/a Were you able to get the supplies/equipment? not applicable Do you have any questions related to the use of the equipment or supplies? No  Functional Questionnaire: (I = Independent and D = Dependent) ADLs: needs assistance with ADLs which her  daughter has been providing. She has a cane and walker to use if needed.   Follow up appointments reviewed:  PCP Hospital f/u appt confirmed? Yes  Scheduled to see Ricky Stabs, NP -  03/03/2022.  The appointment information and clinic address were text to Diane Berry as she requested.  Specialist Hospital f/u appt confirmed?  None scheduled at this time Are transportation arrangements needed? No  If their condition worsens, is the pt aware to call PCP or go to the Emergency Dept.? Yes Was the patient provided with contact information for the PCP's office or ED? Yes Was to pt encouraged to call back with questions or concerns? Yes

## 2022-02-17 NOTE — Progress Notes (Signed)
TRANSITION OF CARE VISIT   Date of Admission: 02/02/2022  Date of Discharge: 02/05/2022  Transitions of Care Call: 02/12/2022  Discharged from: [facility]   Discharge Diagnosis:  Small Acute ischemic thalamic CVA Hypertensive urgency Seizure-like activity Cognitive decline Hypokalemia Microcytic hypochromic anemia Chest abdominal and back pain on ROS - unclear etiology  HLD   Summary of Admission per MD note:  Tests Needing Follow-up: -monitor BP closely - goal is strict control -recheck LFTs and lipids in 8-12 weeks w/ new statin Rx recheck between 10/29 and 11/29 -assure pt is compliant w/ prescription medications -follow anemia and assure patient up to date w/ routine health screening/colonscopy   Initial presentation: 61 year old with a history of HTN, HLD, monocular blindness, and a intraventricular hemorrhage in 2019 who presented to the ER with a 3-day history of right-sided facial droop and numbness of the right arm.  The patient was a code stroke on presentation. CT head did not reveal any acute abnormality but did note chronic small vessel ischemic change. The patient's presenting blood pressure was 228/110.  MRI of the brain noted an 8 mm acute ischemic nonhemorrhagic left thalamic infarct with multiple prior microvascular ischemic infarcts and multiple chronic microhemorrhages.   Hospital Course:   Small Acute ischemic thalamic CVA DAPT with aspirin plus Plavix x 21 days followed by aspirin alone - LDL 131 therefore statin initiated - A1c 4.9 - no acute related findings on TTE  - likely due to SVD with uncontrolled blood pressure - Therapy recommended outpatient PT   Hypertensive urgency BP 228/110 on admission -careful lowered allowing for permissive hypertension - f/u in outpt setting w/ aggressive long term control indicated   Seizure-like activity Further history reveals episodes of possible convulsive  behavior -observed for w/o recurrence during this admission - Neurology recommended against initiation of seizure medicine presently    Cognitive decline 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 related to hypertension   Hypokalemia Mild - corrected w/ supplementation    Microcytic hypochromic anemia Hemoglobin 10.0 which appears to be her baseline - anemia panel most c/w chronic disease pattern, likely related to poor nutrition - outpt monitoring and routine health screening indicated    Chest abdominal and back pain on ROS No acute findings on CT abdomen/pelvis -no acute findings on CXR - appears to be resolved at time of d/c    HLD LDL 131 -patient was noncompliant with atorvastatin at home - statin newly re-prescribed this admit w/ counseling on need for complicance    Follow-Ups Follow up with [facility]; Your outpatient therapy has been set up. The office will call you to set up start of service. Please call number listed above for any questions or concerns. Follow up with Micki Riley, MD (Neurology) in 7 weeks (03/26/2022)  Today's visit 03/03/2022: Patient presents today for hospital discharge follow-up. She is accompanied by her daughter, Freada Bergeron and her daughter's boyfriend, Gabriel Rung. Daughter serves as primary historian and interpreter. Since hospital discharge patient feeling improved but still having some lingering stomach pain. Daughter reports patient quit taking blood pressure medication some time during 2022 due to having normal blood pressures. Daughter states patient wanted to take the blood pressure medication but she didn't think patient needed to therefore patient quit taking. Reports she never took her mother back to any primary care appointments for blood pressure medication refills. Reports she feels like her mother's health issues are her fault because she told her mother not to take her medications. She is  currently checking her mother's blood  pressure in the home setting with higher than normal readings. Endorses heart healthy diet and walking for exercise. Daughter reports patient was treated with medications during hospital admission but at hospital discharge she was not prescribed blood pressure medication. Patient denies any red flag symptoms today. Completed course of Plavix. She is continuing with Aspirin. Patient does not have a follow-up appointment scheduled with Neurology. Has residual numbness of upper extremities from stroke. Patient's daughter declines referral to Outpatient Rehab. Reports she is a massage therapist and massages her mother which she states patient says helps. Daughter and daughter's boyfriend state they feel that the only thing Outpatient Rehab does is "wave their finger in front of her eyes". 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 Taking cholesterol medication but daughter reports concerns this may cause body soreness as medications of the past did.   Patient/Caregiver self-reported problems/concerns:  see above  MEDICATIONS  Medication Reconciliation conducted with patient/caregiver? (Yes/ No): yes   New medications prescribed/discontinued upon discharge? (Yes/No): yes  Barriers identified related to medications: no  LABS  Lab Reviewed (Yes/No/NA): yes  PHYSICAL EXAM:  Physical Exam HENT:     Head: Normocephalic and atraumatic.  Eyes:     Extraocular Movements: Extraocular movements intact.     Conjunctiva/sclera: Conjunctivae normal.     Pupils: Pupils are equal, round, and reactive to light.  Cardiovascular:     Rate and Rhythm: Normal rate and regular rhythm.     Pulses: Normal pulses.     Heart sounds: Normal heart sounds.  Pulmonary:     Effort: Pulmonary effort is normal.     Breath sounds: Normal breath sounds.  Musculoskeletal:     Cervical back: Normal range of motion and neck supple.  Neurological:     General: No focal deficit present.     Mental  Status: She is alert and oriented to person, place, and time.  Psychiatric:        Mood and Affect: Mood normal.        Behavior: Behavior normal.    ASSESSMENT AND PLAN: 1. Hospital discharge follow-up - Reviewed hospital course, current medications, ensured proper follow-up in place, and addressed concerns.  - Patient will return for hepatic function panel.  - Hepatic Function Panel; Future   2. Cerebrovascular accident (CVA), unspecified mechanism (HCC) 3. Acute ischemic stroke (HCC) 4. Seizure-like activity (HCC) - Patient completed course of Clopidogrel Bisulfate.  - Continue Aspirin as prescribed. No refills needed as of present.  - Patient and patient's daughter declined referral to Outpatient Rehab. - Referral to Neurology for further evaluation and management.  - Ambulatory referral to Neurology  5. Hypertensive urgency - Blood pressure not at goal during today's visit. Patient asymptomatic without chest pressure, chest pain, palpitations, shortness of breath, worst headache of life, and any additional red flag symptoms. - Patient's daughter reports patient has not taken Amlodipine since 2022 due to not keeping appointments with primary care for medication refills and patient having normal blood pressures. Daughter reports she told her mother she did not need to take blood pressure medication since home blood pressures were normal.  - Upon review of hospital discharge patient was not discharged home with any blood pressure medication. I am unable to determine why patient was discharged home without blood pressure medication. However, patient's daughter states patient was treated with blood pressure medication during hospital admission.  - Begin Amlodipine as prescribed. Counseled on medication adherence and  adverse effects.  - Counseled on blood pressure goal of less than 140/90, low-sodium, DASH diet, medication compliance, 150 minutes of moderate intensity exercise per week as  tolerated. Discussed medication compliance, adverse effects. - Referral to Cardiology for further evaluation and management.  - Follow-up with primary provider in 1 weeks or sooner if needed for blood pressure check. - Ambulatory referral to Cardiology - amLODipine (NORVASC) 5 MG tablet; Take 1 tablet (5 mg total) by mouth daily.  Dispense: 30 tablet; Refill: 2  6. Hyperlipidemia, unspecified hyperlipidemia type - Continue Atorvastatin as prescribed. No refills needed as of present. - Patient will return for lipid panel.  - Referral to Cardiology for further evaluation and management. - Ambulatory referral to Cardiology - Lipid Panel; Future  7. Microcytic hypochromic anemia 8. Anemia, chronic disease - Referral to Hematology / Oncology for further evaluation and management.  - Ambulatory referral to Hematology / Oncology  9. Colon cancer screening - Referral to Gastroenterology for colon cancer screening by colonoscopy. - Ambulatory referral to Gastroenterology  10. Hypokalemia - Corrected prior to hospital discharge.   11. Chest pain, unspecified type - Patient reports resolved.   12. Abdominal pain, unspecified abdominal location - Referral to Gastroenterology for further evaluation and management.  - Ambulatory referral to Gastroenterology  13. Back pain, unspecified back location, unspecified back pain laterality, unspecified chronicity - Patient reports resolved.   14. Language barrier - Patient accompanied by daughter, Key, who serves as primary historian and interpreter.    PATIENT EDUCATION PROVIDED: See AVS   FOLLOW-UP (Include any further testing or referrals):  - Follow-up with primary care provider in 1 week or sooner if needed.  - Referral to Cardiology.  - Referral to Neurology.  - Referral to Gastroenterology.  - Referral to Hematology/Oncology.  - Return for labs.  Patient was given clear instructions to go to Emergency Department or return to medical  center if symptoms don't improve, worsen, or new problems develop.The patient verbalized understanding.

## 2022-02-20 ENCOUNTER — Other Ambulatory Visit (INDEPENDENT_AMBULATORY_CARE_PROVIDER_SITE_OTHER): Payer: Self-pay | Admitting: Primary Care

## 2022-02-20 ENCOUNTER — Other Ambulatory Visit: Payer: Self-pay

## 2022-02-20 NOTE — Telephone Encounter (Signed)
Will forward to provider  

## 2022-02-23 ENCOUNTER — Ambulatory Visit (INDEPENDENT_AMBULATORY_CARE_PROVIDER_SITE_OTHER): Payer: Self-pay | Admitting: *Deleted

## 2022-02-23 ENCOUNTER — Other Ambulatory Visit: Payer: Self-pay

## 2022-02-23 NOTE — Telephone Encounter (Signed)
  Chief Complaint: elevated BP Symptoms: elevated BP-dizziness, headache, weakness Frequency: recent hospitalization- no follow up yet and patient is out of medication- Plavix. Patient is not on any BP medication Pertinent Negatives: Patient denies   Disposition: [x] ED /[] Urgent Care (no appt availability in office) / [] Appointment(In office/virtual)/ []  [place] [facility]/ [] Home Care/ [] Refused Recommended Disposition /[] [place] [facility]/ []  Follow-up with PCP Additional Notes: Advised per protocol- needs evaluation of BP   Reason for Disposition  [5] Systolic BP  >= 053 OR Diastolic >= 976 AND [7] cardiac (e.g., breathing difficulty, chest pain) or neurologic symptoms (e.g., new-onset blurred or double vision, unsteady gait)  Answer Assessment - Initial Assessment Questions 1. BLOOD PRESSURE: "What is the blood pressure?" "Did you take at least two measurements 5 minutes apart?"     198/102, 195/93 P 69 2. ONSET: "When did you take your blood pressure?"     1:00am,  3. HOW: "How did you take your blood pressure?" (e.g., automatic home BP monitor, visiting nurse)     Automatic cuff- arm 4. HISTORY: "Do you have a history of high blood pressure?"     yes 5. MEDICINES: "Are you taking any medicines for blood pressure?" "Have you missed any doses recently?"     Plavix- missed doses, atorvastatin 6. OTHER SYMPTOMS: "Do you have any symptoms?" (e.g., blurred vision, chest pain, difficulty breathing, headache, weakness)     Headache, weakness, dizziness 7. PREGNANCY: "Is there any chance you are pregnant?" "When was your last menstrual period?"  Protocols used: Blood Pressure - High-A-AH

## 2022-02-23 NOTE — Telephone Encounter (Signed)
Pt had been in the hospital and her hospital follow up is not until 9/26. Pt out of med and bp is high.  Pt transferred to [facility].

## 2022-02-23 NOTE — Telephone Encounter (Signed)
Will forward to provider  

## 2022-02-24 ENCOUNTER — Other Ambulatory Visit: Payer: Self-pay

## 2022-02-25 NOTE — Telephone Encounter (Signed)
I am aware  

## 2022-03-03 ENCOUNTER — Encounter: Payer: Self-pay | Admitting: Family

## 2022-03-03 ENCOUNTER — Ambulatory Visit: Payer: No Typology Code available for payment source | Admitting: Family

## 2022-03-03 ENCOUNTER — Ambulatory Visit (INDEPENDENT_AMBULATORY_CARE_PROVIDER_SITE_OTHER): Payer: Medicaid Other | Admitting: Family

## 2022-03-03 VITALS — BP 166/84 | HR 70 | Temp 98.3°F | Resp 16 | Wt 105.0 lb

## 2022-03-03 DIAGNOSIS — Z789 Other specified health status: Secondary | ICD-10-CM

## 2022-03-03 DIAGNOSIS — Z09 Encounter for follow-up examination after completed treatment for conditions other than malignant neoplasm: Secondary | ICD-10-CM

## 2022-03-03 DIAGNOSIS — D509 Iron deficiency anemia, unspecified: Secondary | ICD-10-CM

## 2022-03-03 DIAGNOSIS — R079 Chest pain, unspecified: Secondary | ICD-10-CM

## 2022-03-03 DIAGNOSIS — I639 Cerebral infarction, unspecified: Secondary | ICD-10-CM | POA: Diagnosis not present

## 2022-03-03 DIAGNOSIS — Z603 Acculturation difficulty: Secondary | ICD-10-CM

## 2022-03-03 DIAGNOSIS — E876 Hypokalemia: Secondary | ICD-10-CM

## 2022-03-03 DIAGNOSIS — R569 Unspecified convulsions: Secondary | ICD-10-CM | POA: Diagnosis not present

## 2022-03-03 DIAGNOSIS — R109 Unspecified abdominal pain: Secondary | ICD-10-CM

## 2022-03-03 DIAGNOSIS — E785 Hyperlipidemia, unspecified: Secondary | ICD-10-CM

## 2022-03-03 DIAGNOSIS — D638 Anemia in other chronic diseases classified elsewhere: Secondary | ICD-10-CM

## 2022-03-03 DIAGNOSIS — Z7689 Persons encountering health services in other specified circumstances: Secondary | ICD-10-CM

## 2022-03-03 DIAGNOSIS — I160 Hypertensive urgency: Secondary | ICD-10-CM | POA: Diagnosis not present

## 2022-03-03 DIAGNOSIS — Z1211 Encounter for screening for malignant neoplasm of colon: Secondary | ICD-10-CM

## 2022-03-03 DIAGNOSIS — M549 Dorsalgia, unspecified: Secondary | ICD-10-CM

## 2022-03-03 MED ORDER — AMLODIPINE BESYLATE 5 MG PO TABS: 5.0000 mg | ORAL_TABLET | Freq: Every day | ORAL | 2 refills | Status: AC

## 2022-03-03 NOTE — Progress Notes (Signed)
.  Pt presents for hospital follow-up accompanied by son in law and daughter  -pt has not had Amlodipine in few weeks was not given to pt at hospital

## 2022-03-03 NOTE — Patient Instructions (Signed)
Ischemic Stroke  An ischemic stroke (cerebrovascular accident, CVA) occurs when an area of the brain does not get enough blood flow. This leads to the sudden death of brain tissue and can cause brain damage. An ischemic stroke is a medical emergency. It must be treated right away. What are the causes? This condition is caused by a decrease of blood flow to a part of the brain. This may be due to: A small blood clot (embolus). A buildup of plaque in the blood vessels (atherosclerosis). This blocks blood flow in the brain. An abnormal heart rhythm, called atrial fibrillation (AFib). This sends a small blood clot to the brain. A blocked or damaged artery in the head or neck. Certain infections. Inflammation of the arteries in the brain (vasculitis). Sometimes, the cause of ischemic stroke is not known. What increases the risk? The following medical conditions may increase your risk of a stroke: High blood pressure (hypertension). Heart disease. Diabetes. High cholesterol. Obesity. Sleep problems (sleep apnea). Other risk factors that you can change include: Using products that contain nicotine or tobacco. Not being active. Heavy use of alcohol or drugs, especially cocaine and methamphetamine. Taking birth control pills, especially if you also use tobacco. Risk factors that you cannot change include: Being older than age 60. Having a history of blood clots, stroke, or mini-stroke (transient ischemic attack, TIA). Having a family history of stroke. What are the signs or symptoms? Symptoms of this condition usually develop suddenly, or you may notice them after waking from sleep. These may include: Weakness or numbness of your face, arm, or leg, especially on one side of your body. Loss of balance or coordination. Slurred speech, trouble speaking, trouble understanding speech, or a combination of these. Vision changes. You may have double vision, blurred vision, or loss of  vision. Dizziness or confusion. Nausea and vomiting. Severe headache. If possible, write down the exact time your symptoms started. Tell your health care provider. If symptoms come and go, they could be signs of a TIA. Get help right away, even if you feel better. How is this diagnosed? This condition may be diagnosed based on: Your symptoms, your medical history, and a physical exam. CT scan of the brain. MRI. Imaging tests that scan blood flow in the brain (CT angiogram, MRI angiogram, or cerebral angiogram). You may also have other tests, including: Electrocardiogram (ECG). Continuous heart monitoring. Transthoracic echocardiogram (TTE). Transesophageal echocardiogram (TEE). Carotid ultrasound. Blood tests. Sleep study to check for sleep apnea. You may need to see a health care provider who specializes in stroke care. How is this treated? Treatment for this condition depends on the area of the brain affected and the cause of your symptoms. Some treatments work better if they are done within 3-6 hours of the start of symptoms. These may include: Medicine that is injected to dissolve the blood clot. Treatments given directly to the affected artery to remove or dissolve the blood clot. Medicines to control blood pressure. Medicines to thin the blood (anticoagulant or antiplatelet). Other treatments may include: Oxygen. IV fluids. Procedures to increase blood flow. After a stroke, you may work with physical, speech, mental health, or occupational therapists to help you recover. Follow these instructions at home: Medicines Take over-the-counter and prescription medicines only as told by your health care provider. If you were told to take a medicine to thin your blood, take it exactly as told, at the same time every day. Taking too much of a blood thinner can cause bleeding. Taking   too little may not protect you against a stroke and other problems. If you were not prescribed  medicines that contain aspirin or NSAIDs, such as ibuprofen, talk with your health care provider before you take any of these. These medicines increase your risk for dangerous bleeding. When taking a blood thinner, do these things: Hold pressure over any cuts that bleed for longer than usual. Tell your dentist and other health care providers that you are taking anticoagulants before having any procedures that may cause bleeding. Avoid activities that could cause injury or bruising. Wear a medical alert bracelet or carry a card that lists what medicines you take. Eating and drinking Follow instructions from your health care provider about diet. Eat healthy foods. If your stroke affected your ability to swallow, you may need to take steps to avoid choking. These may include: Taking small bites of food. Eating soft or pureed foods. Safety Follow instructions from your health care team about physical activity. Use a walker or cane as told by your health care provider. Take steps to lower your risk of falls at home. These may include: Installing grab bars in the bedroom and bathroom. Using raised toilets and putting a seat in the shower. Removing clutter and tripping hazards, such as cords or area rugs. General instructions Do not use any products that contain nicotine or tobacco. These include cigarettes, chewing tobacco, and vaping devices, such as e-cigarettes. If you need help quitting, ask your health care provider. If you drink alcohol: Limit how much you have to: 0-1 drink a day for women who are not pregnant. 0-2 drinks a day for men. Know how much alcohol is in your drink. In the [place] one drink equals one 12 oz bottle of beer (355 mL), one 5 oz glass of wine (148 mL), or one 1 oz glass of hard liquor (44 mL). Keep all follow-up visits. This is important. How is this prevented? You can lower your risk of another stroke by managing these conditions: High blood pressure. High  cholesterol. Diabetes. Heart disease. Sleep apnea. Obesity. Quitting smoking, limiting alcohol, and staying physically active also will reduce your risk. Your health care provider will continue to help you with ways to prevent short-term and long-term problems caused by stroke. Get help right away if: You have any symptoms of a stroke. "BE FAST" is an easy way to remember the main warning signs of a stroke: B - Balance. Signs are dizziness, sudden trouble walking, or loss of balance. E - Eyes. Signs are trouble seeing or a sudden change in vision. F - Face. Signs are sudden weakness or numbness of the face, or the face or eyelid drooping on one side. A - Arms. Signs are weakness or numbness in an arm. This happens suddenly and usually on one side of the body. S - Speech. Signs are sudden trouble speaking, slurred speech, or trouble understanding what people say. T - Time. Time to call emergency services. Write down what time symptoms started. You have other signs of a stroke, such as: A sudden, severe headache with no known cause. Nausea or vomiting. Seizure. These symptoms may represent a serious problem that is an emergency. Do not wait to see if the symptoms will go away. Get medical help right away. Call your local emergency services (911 in the [place] Do not drive yourself to the hospital. Summary An ischemic stroke (cerebrovascular accident, CVA) occurs when an area of the brain does not get enough blood flow. Symptoms of this   condition usually develop suddenly, or you may notice them after waking from sleep. It is very important to get treatment at the first sign of stroke symptoms. Stroke is a medical emergency that must be treated right away. This information is not intended to replace advice given to you by your health care provider. Make sure you discuss any questions you have with your health care provider. Document Revised: 01/03/2020 Document Reviewed: 01/03/2020 [employer]  Patient Education  2022 [employer]  

## 2022-03-06 ENCOUNTER — Other Ambulatory Visit: Payer: Self-pay

## 2022-03-06 ENCOUNTER — Encounter: Payer: Self-pay | Admitting: Family

## 2022-03-09 ENCOUNTER — Other Ambulatory Visit: Payer: Self-pay | Admitting: Family

## 2022-03-09 DIAGNOSIS — D649 Anemia, unspecified: Secondary | ICD-10-CM

## 2022-03-10 ENCOUNTER — Inpatient Hospital Stay: Payer: Self-pay | Admitting: Medical Oncology

## 2022-03-10 ENCOUNTER — Inpatient Hospital Stay: Payer: Self-pay | Attending: Hematology & Oncology

## 2022-03-14 ENCOUNTER — Inpatient Hospital Stay (HOSPITAL_COMMUNITY)
Admission: EM | Admit: 2022-03-14 | Discharge: 2022-04-08 | DRG: 064 | Disposition: E | Payer: Medicaid Other | Attending: Neurology | Admitting: Neurology

## 2022-03-14 ENCOUNTER — Emergency Department (HOSPITAL_COMMUNITY): Payer: Medicaid Other

## 2022-03-14 ENCOUNTER — Encounter (HOSPITAL_COMMUNITY): Payer: Self-pay

## 2022-03-14 DIAGNOSIS — Z7982 Long term (current) use of aspirin: Secondary | ICD-10-CM

## 2022-03-14 DIAGNOSIS — G9382 Brain death: Secondary | ICD-10-CM | POA: Diagnosis not present

## 2022-03-14 DIAGNOSIS — Z66 Do not resuscitate: Secondary | ICD-10-CM | POA: Diagnosis present

## 2022-03-14 DIAGNOSIS — Z515 Encounter for palliative care: Secondary | ICD-10-CM | POA: Diagnosis not present

## 2022-03-14 DIAGNOSIS — I119 Hypertensive heart disease without heart failure: Secondary | ICD-10-CM | POA: Diagnosis present

## 2022-03-14 DIAGNOSIS — R1310 Dysphagia, unspecified: Secondary | ICD-10-CM | POA: Diagnosis present

## 2022-03-14 DIAGNOSIS — I959 Hypotension, unspecified: Secondary | ICD-10-CM | POA: Diagnosis not present

## 2022-03-14 DIAGNOSIS — G935 Compression of brain: Secondary | ICD-10-CM | POA: Diagnosis present

## 2022-03-14 DIAGNOSIS — I610 Nontraumatic intracerebral hemorrhage in hemisphere, subcortical: Principal | ICD-10-CM | POA: Diagnosis present

## 2022-03-14 DIAGNOSIS — I629 Nontraumatic intracranial hemorrhage, unspecified: Secondary | ICD-10-CM | POA: Diagnosis not present

## 2022-03-14 DIAGNOSIS — Z8673 Personal history of transient ischemic attack (TIA), and cerebral infarction without residual deficits: Secondary | ICD-10-CM | POA: Diagnosis not present

## 2022-03-14 DIAGNOSIS — I615 Nontraumatic intracerebral hemorrhage, intraventricular: Secondary | ICD-10-CM | POA: Diagnosis present

## 2022-03-14 DIAGNOSIS — Z8249 Family history of ischemic heart disease and other diseases of the circulatory system: Secondary | ICD-10-CM

## 2022-03-14 DIAGNOSIS — Z7902 Long term (current) use of antithrombotics/antiplatelets: Secondary | ICD-10-CM | POA: Diagnosis not present

## 2022-03-14 DIAGNOSIS — E785 Hyperlipidemia, unspecified: Secondary | ICD-10-CM | POA: Diagnosis present

## 2022-03-14 DIAGNOSIS — G9340 Encephalopathy, unspecified: Secondary | ICD-10-CM | POA: Diagnosis present

## 2022-03-14 DIAGNOSIS — G911 Obstructive hydrocephalus: Secondary | ICD-10-CM | POA: Diagnosis present

## 2022-03-14 DIAGNOSIS — J9601 Acute respiratory failure with hypoxia: Secondary | ICD-10-CM | POA: Diagnosis present

## 2022-03-14 DIAGNOSIS — E876 Hypokalemia: Secondary | ICD-10-CM | POA: Diagnosis present

## 2022-03-14 DIAGNOSIS — R29736 NIHSS score 36: Secondary | ICD-10-CM | POA: Diagnosis present

## 2022-03-14 DIAGNOSIS — Z79899 Other long term (current) drug therapy: Secondary | ICD-10-CM

## 2022-03-14 DIAGNOSIS — R569 Unspecified convulsions: Secondary | ICD-10-CM | POA: Diagnosis present

## 2022-03-14 DIAGNOSIS — D509 Iron deficiency anemia, unspecified: Secondary | ICD-10-CM | POA: Diagnosis present

## 2022-03-14 DIAGNOSIS — G936 Cerebral edema: Secondary | ICD-10-CM | POA: Diagnosis present

## 2022-03-14 DIAGNOSIS — I169 Hypertensive crisis, unspecified: Secondary | ICD-10-CM | POA: Diagnosis present

## 2022-03-14 DIAGNOSIS — I639 Cerebral infarction, unspecified: Secondary | ICD-10-CM | POA: Diagnosis not present

## 2022-03-14 DIAGNOSIS — E873 Alkalosis: Secondary | ICD-10-CM | POA: Diagnosis present

## 2022-03-14 LAB — GLUCOSE, CAPILLARY: Glucose-Capillary: 182 mg/dL — ABNORMAL HIGH (ref 70–99)

## 2022-03-14 LAB — URINALYSIS, ROUTINE W REFLEX MICROSCOPIC
Bilirubin Urine: NEGATIVE
Glucose, UA: 50 mg/dL — AB
Ketones, ur: NEGATIVE mg/dL
Leukocytes,Ua: NEGATIVE
Nitrite: NEGATIVE
Protein, ur: 100 mg/dL — AB
Specific Gravity, Urine: 1.008 (ref 1.005–1.030)
pH: 7 (ref 5.0–8.0)

## 2022-03-14 LAB — URINALYSIS, MICROSCOPIC (REFLEX)
Bacteria, UA: NONE SEEN
RBC / HPF: >50 RBC/hpf (ref 0–5)

## 2022-03-14 LAB — CBC
HCT: 35.4 % — ABNORMAL LOW (ref 36.0–46.0)
Hemoglobin: 10.9 g/dL — ABNORMAL LOW (ref 12.0–15.0)
MCH: 22.7 pg — ABNORMAL LOW (ref 26.0–34.0)
MCHC: 30.8 g/dL (ref 30.0–36.0)
MCV: 73.6 fL — ABNORMAL LOW (ref 80.0–100.0)
Platelets: 274 10*3/uL (ref 150–400)
RBC: 4.81 MIL/uL (ref 3.87–5.11)
RDW: 16.4 % — ABNORMAL HIGH (ref 11.5–15.5)
WBC: 17.9 10*3/uL — ABNORMAL HIGH (ref 4.0–10.5)
nRBC: 0 % (ref 0.0–0.2)

## 2022-03-14 LAB — I-STAT ARTERIAL BLOOD GAS, ED
Acid-base deficit: 2 mmol/L (ref 0.0–2.0)
Bicarbonate: 26.4 mmol/L (ref 20.0–28.0)
Calcium, Ion: 1.23 mmol/L (ref 1.15–1.40)
HCT: 33 % — ABNORMAL LOW (ref 36.0–46.0)
Hemoglobin: 11.2 g/dL — ABNORMAL LOW (ref 12.0–15.0)
O2 Saturation: 100 %
Patient temperature: 98.3
Potassium: 3 mmol/L — ABNORMAL LOW (ref 3.5–5.1)
Sodium: 142 mmol/L (ref 135–145)
TCO2: 28 mmol/L (ref 22–32)
pCO2 arterial: 58.9 mmHg — ABNORMAL HIGH (ref 32–48)
pH, Arterial: 7.258 — ABNORMAL LOW (ref 7.35–7.45)
pO2, Arterial: 370 mmHg — ABNORMAL HIGH (ref 83–108)

## 2022-03-14 LAB — RAPID URINE DRUG SCREEN, HOSP PERFORMED
Amphetamines: NOT DETECTED
Barbiturates: NOT DETECTED
Benzodiazepines: POSITIVE — AB
Cocaine: NOT DETECTED
Opiates: NOT DETECTED
Tetrahydrocannabinol: NOT DETECTED

## 2022-03-14 LAB — I-STAT CHEM 8, ED
BUN: 13 mg/dL (ref 8–23)
Calcium, Ion: 1.15 mmol/L (ref 1.15–1.40)
Chloride: 107 mmol/L (ref 98–111)
Creatinine, Ser: 1.1 mg/dL — ABNORMAL HIGH (ref 0.44–1.00)
Glucose, Bld: 186 mg/dL — ABNORMAL HIGH (ref 70–99)
HCT: 38 % (ref 36.0–46.0)
Hemoglobin: 12.9 g/dL (ref 12.0–15.0)
Potassium: 2.9 mmol/L — ABNORMAL LOW (ref 3.5–5.1)
Sodium: 143 mmol/L (ref 135–145)
TCO2: 21 mmol/L — ABNORMAL LOW (ref 22–32)

## 2022-03-14 LAB — COMPREHENSIVE METABOLIC PANEL
ALT: 10 U/L (ref 0–44)
AST: 38 U/L (ref 15–41)
Albumin: 4.5 g/dL (ref 3.5–5.0)
Alkaline Phosphatase: 117 U/L (ref 38–126)
Anion gap: 14 (ref 5–15)
BUN: 12 mg/dL (ref 8–23)
CO2: 20 mmol/L — ABNORMAL LOW (ref 22–32)
Calcium: 9.4 mg/dL (ref 8.9–10.3)
Chloride: 108 mmol/L (ref 98–111)
Creatinine, Ser: 1.14 mg/dL — ABNORMAL HIGH (ref 0.44–1.00)
GFR, Estimated: 54 mL/min — ABNORMAL LOW (ref >60)
Glucose, Bld: 185 mg/dL — ABNORMAL HIGH (ref 70–99)
Potassium: 2.9 mmol/L — ABNORMAL LOW (ref 3.5–5.1)
Sodium: 142 mmol/L (ref 135–145)
Total Bilirubin: 0.7 mg/dL (ref 0.3–1.2)
Total Protein: 8.4 g/dL — ABNORMAL HIGH (ref 6.5–8.1)

## 2022-03-14 LAB — ETHANOL: Alcohol, Ethyl (B): <10 mg/dL (ref <10)

## 2022-03-14 LAB — PROTIME-INR
INR: 0.9 (ref 0.8–1.2)
Prothrombin Time: 12.1 seconds (ref 11.4–15.2)

## 2022-03-14 MED ORDER — FENTANYL 2500MCG IN NS 250ML (10MCG/ML) PREMIX INFUSION: 50.0000 ug/h | INTRAVENOUS | Status: DC

## 2022-03-14 MED ORDER — SENNOSIDES-DOCUSATE SODIUM 8.6-50 MG PO TABS
1.0000 | ORAL_TABLET | Freq: Two times a day (BID) | ORAL | Status: DC
  Filled 2022-03-14: qty 1

## 2022-03-14 MED ORDER — ORAL CARE MOUTH RINSE
15.0000 mL | OROMUCOSAL | Status: DC
  Administered 2022-03-15 – 2022-03-17 (×26): 15 mL via OROMUCOSAL

## 2022-03-14 MED ORDER — STROKE: EARLY STAGES OF RECOVERY BOOK: Freq: Once | Status: DC

## 2022-03-14 MED ORDER — PANTOPRAZOLE 2 MG/ML SUSPENSION
40.0000 mg | Freq: Every day | ORAL | Status: DC
  Administered 2022-03-15: 40 mg
  Filled 2022-03-14 (×4): qty 20

## 2022-03-14 MED ORDER — POTASSIUM CHLORIDE 20 MEQ PO PACK
40.0000 meq | PACK | Freq: Once | ORAL | Status: DC
  Filled 2022-03-14: qty 2

## 2022-03-14 MED ORDER — ACETAMINOPHEN 325 MG PO TABS: 650.0000 mg | ORAL_TABLET | ORAL | Status: DC | PRN

## 2022-03-14 MED ORDER — LEVETIRACETAM IN NACL 1000 MG/100ML IV SOLN
1000.0000 mg | Freq: Two times a day (BID) | INTRAVENOUS | Status: DC
  Administered 2022-03-14 – 2022-03-16 (×4): 1000 mg via INTRAVENOUS
  Filled 2022-03-14 (×4): qty 100

## 2022-03-14 MED ORDER — CLEVIDIPINE BUTYRATE 0.5 MG/ML IV EMUL
0.0000 mg/h | INTRAVENOUS | Status: DC
  Administered 2022-03-14: 2 mg/h via INTRAVENOUS

## 2022-03-14 MED ORDER — ACETAMINOPHEN 650 MG RE SUPP: 650.0000 mg | RECTAL | Status: DC | PRN

## 2022-03-14 MED ORDER — POTASSIUM CHLORIDE 10 MEQ/100ML IV SOLN
10.0000 meq | INTRAVENOUS | Status: AC
  Administered 2022-03-14 – 2022-03-15 (×3): 10 meq via INTRAVENOUS
  Filled 2022-03-14 (×3): qty 100

## 2022-03-14 MED ORDER — MAGNESIUM SULFATE 2 GM/50ML IV SOLN
2.0000 g | Freq: Once | INTRAVENOUS | Status: DC
  Filled 2022-03-14: qty 50

## 2022-03-14 MED ORDER — LABETALOL HCL 5 MG/ML IV SOLN
INTRAVENOUS | Status: AC
  Administered 2022-03-14: 10 mg via INTRAVENOUS
  Filled 2022-03-14: qty 4

## 2022-03-14 MED ORDER — POLYETHYLENE GLYCOL 3350 17 G PO PACK
17.0000 g | PACK | Freq: Every day | ORAL | Status: DC
  Filled 2022-03-14: qty 1

## 2022-03-14 MED ORDER — FENTANYL CITRATE PF 50 MCG/ML IJ SOSY: 50.0000 ug | PREFILLED_SYRINGE | Freq: Once | INTRAMUSCULAR | Status: DC

## 2022-03-14 MED ORDER — CLEVIDIPINE BUTYRATE 0.5 MG/ML IV EMUL
0.0000 mg/h | INTRAVENOUS | Status: DC
  Administered 2022-03-15: 3 mg/h via INTRAVENOUS
  Filled 2022-03-14: qty 50

## 2022-03-14 MED ORDER — PANTOPRAZOLE SODIUM 40 MG IV SOLR: 40.0000 mg | Freq: Every day | INTRAVENOUS | Status: DC

## 2022-03-14 MED ORDER — FENTANYL CITRATE (PF) 100 MCG/2ML IJ SOLN: 50.0000 ug | Freq: Once | INTRAMUSCULAR | Status: DC

## 2022-03-14 MED ORDER — ROCURONIUM BROMIDE 50 MG/5ML IV SOLN
INTRAVENOUS | Status: DC | PRN
  Administered 2022-03-14: 80 mg via INTRAVENOUS

## 2022-03-14 MED ORDER — ETOMIDATE 2 MG/ML IV SOLN
INTRAVENOUS | Status: DC | PRN
  Administered 2022-03-14: 20 mg via INTRAVENOUS

## 2022-03-14 MED ORDER — PROPOFOL 1000 MG/100ML IV EMUL
0.0000 ug/kg/min | INTRAVENOUS | Status: DC
  Administered 2022-03-14: 40 ug/kg/min via INTRAVENOUS
  Administered 2022-03-14: 50 ug/kg/min via INTRAVENOUS
  Filled 2022-03-14: qty 100

## 2022-03-14 MED ORDER — ORAL CARE MOUTH RINSE: 15.0000 mL | OROMUCOSAL | Status: DC | PRN

## 2022-03-14 MED ORDER — ACETAMINOPHEN 160 MG/5ML PO SOLN: 650.0000 mg | ORAL | Status: DC | PRN

## 2022-03-14 MED ORDER — LABETALOL HCL 5 MG/ML IV SOLN: 20.0000 mg | Freq: Once | INTRAVENOUS | Status: DC

## 2022-03-14 MED ORDER — CHLORHEXIDINE GLUCONATE CLOTH 2 % EX PADS
6.0000 | MEDICATED_PAD | Freq: Every day | CUTANEOUS | Status: DC
  Administered 2022-03-14 – 2022-03-16 (×2): 6 via TOPICAL

## 2022-03-14 MED ORDER — LABETALOL HCL 5 MG/ML IV SOLN: 10.0000 mg | Freq: Once | INTRAVENOUS | Status: AC

## 2022-03-14 MED ORDER — FENTANYL BOLUS VIA INFUSION: 50.0000 ug | INTRAVENOUS | Status: DC | PRN

## 2022-03-14 MED ORDER — INSULIN ASPART 100 UNIT/ML IJ SOLN: 0.0000 [IU] | INTRAMUSCULAR | Status: DC

## 2022-03-14 MED ORDER — DOCUSATE SODIUM 50 MG/5ML PO LIQD
100.0000 mg | Freq: Two times a day (BID) | ORAL | Status: DC
  Filled 2022-03-14: qty 10

## 2022-03-14 NOTE — Consult Note (Signed)
NAMEEllawyn Berry, MRN:  [id number], DOB:  [birth year], LOS: 0 ADMISSION DATE:  03/23/2022, CONSULTATION DATE: March 14, 2022 REFERRING MD: Leonel Ramsay, CHIEF COMPLAINT: Unresponsive  History of Present Illness:  61 year old female with a prior history of stroke developed sudden onset confusion, dizziness and ultimately went unresponsive.  Was brought to the emergency department where she was found to have a massive intracranial hemorrhage.  She has brainstem compression and herniation.  She has been seen by neurology and the case has been discussed with neurosurgery.  Both of those teams feel that there are no medical interventions which will prevent her death.  At this time the patient's family is requesting admission so that family members can come and see her.  Pulmonary and critical care medicine is consulted for management of the ventilator.    Pertinent  Medical History  Stroke Hypertension Cognitive decline Microcytic hypochromic anemia Chronic chest and abdominal pain Hyperlipidemia  Significant Hospital Events: Including procedures, antibiotic start and stop dates in addition to other pertinent events   October 7 admission, intubation for airway protection.  CT head showed large left basal ganglia hemorrhage measuring 4.2 x 6.2 x 4.5 cm with estimated volume approximately 500 mL.  Extensive intraventricular extension of the hemorrhage, mass effect with effacement of sulci, midline shift measuring 8 mmHg hydrocephalus with ballooning of the right temporal tip and crowding of the foramen magnum.  Interim History / Subjective:  As above  Objective   Blood pressure (!) 159/81, pulse 82, resp. rate (!) 23, height 4\' 9"  (1.448 m), SpO2 100 %.    Vent Mode: PRVC FiO2 (%):  [40 %-100 %] 40 % Set Rate:  [18 bmp-24 bmp] 24 bmp Vt Set:  [310 mL] 310 mL PEEP:  [5 cmH20] 5 cmH20 Plateau Pressure:  [13 cmH20] 13 cmH20   Intake/Output Summary (Last 24 hours) at 03/20/2022 2122 Last data  filed at 03/13/2022 2049 Gross per 24 hour  Intake 100 ml  Output --  Net 100 ml   There were no vitals filed for this visit.  Examination:  General:  In bed on vent HENT: NCAT ETT in place PULM: CTA B, vent supported breathing CV: RRR, no mgr GI: BS+, soft, nontender MSK: normal bulk and tone Neuro: left hand twitching and left foot / leg extension to noxious stimuli   Resolved Hospital Problem list     Assessment & Plan:  Massive intracerebral hemorrhage Brain compression Hypertension Acute respiratory failure with hypoxemia due to inability to protect airway Acute encephalopathy secondary to massive hemorrhagic stroke Baseline cognitive decline Irreversible severe brain injury, 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  Plan: Neuro to admit to intensive care unit Full mechanical ventilator order set written: Including ventilator associated pneumonia prevention protocol, pulmonary toilet measures No plan for weaning mechanical ventilation as the patient's mental status will not improve Need to have ongoing goals of care conversations with the patient's family BP management per neurology Hypertonic saline not indicated as it will not be effective Will not order more labs due to futility  Further medical interventions including CPR in the event of cardiac arrest would not provide medical benefit as this patient will die from the massive stroke.  I explained to her daughter that her code status is DNR, she voiced understanding.    Best Practice (right click and "Reselect all SmartList Selections" daily)   Per primary  Labs   CBC: Recent Labs  Lab 03/12/2022 1909 04/02/2022 1918 04/01/2022 2023  WBC 17.9*  --   --  HGB 10.9* 12.9 11.2*  HCT 35.4* 38.0 33.0*  MCV 73.6*  --   --   PLT 274  --   --     Basic Metabolic Panel: Recent Labs  Lab 03/18/2022 1909 03/23/2022 1918 03/31/2022 2023  NA 142 143 142  K 2.9* 2.9* 3.0*  CL 108 107  --   CO2 20*  --   --   GLUCOSE 185* 186*  --   BUN  12 13  --   CREATININE 1.14* 1.10*  --   CALCIUM 9.4  --   --    GFR: Estimated Creatinine Clearance: 34.9 mL/min (A) (by C-G formula based on SCr of 1.1 mg/dL (H)). Recent Labs  Lab 03/25/2022 1909  WBC 17.9*    Liver Function Tests: Recent Labs  Lab 03/18/2022 1909  AST 38  ALT 10  ALKPHOS 117  BILITOT 0.7  PROT 8.4*  ALBUMIN 4.5   No results for input(s): "LIPASE", "AMYLASE" in the last 168 hours. No results for input(s): "AMMONIA" in the last 168 hours.  ABG    Component Value Date/Time   PHART 7.258 (L) 03/26/2022 2023   PCO2ART 58.9 (H) 03/24/2022 2023   PO2ART 370 (H) 03/30/2022 2023   HCO3 26.4 04/02/2022 2023   TCO2 28 03/28/2022 2023   ACIDBASEDEF 2.0 03/21/2022 2023   O2SAT 100 03/19/2022 2023     Coagulation Profile: Recent Labs  Lab 03/21/2022 1909  INR 0.9    Cardiac Enzymes: No results for input(s): "CKTOTAL", "CKMB", "CKMBINDEX", "TROPONINI" in the last 168 hours.  HbA1C: Hemoglobin A1C  Date/Time Value Ref Range Status  12/02/2018 09:07 AM 5.1 4.0 - 5.6 % Final   Hgb A1c MFr Bld  Date/Time Value Ref Range Status  02/03/2022 03:35 AM 4.9 4.8 - 5.6 % Final    Comment:    (NOTE) Pre diabetes:          5.7%-6.4%  Diabetes:              >6.4%  Glycemic control for   <7.0% adults with diabetes   07/24/2017 06:59 AM 5.1 4.8 - 5.6 % Final    Comment:    (NOTE) Pre diabetes:          5.7%-6.4% Diabetes:              >6.4% Glycemic control for   <7.0% adults with diabetes     CBG: No results for input(s): "GLUCAP" in the last 168 hours.  Review of Systems:   Cannot obtain  Past Medical History:  She,  has a past medical history of Stroke (HCC) (07/24/2017).   Surgical History:   Past Surgical History:  Procedure Laterality Date   IR ANGIO INTRA EXTRACRAN SEL COM CAROTID INNOMINATE BILAT MOD SED  07/27/2017   IR ANGIO VERTEBRAL SEL VERTEBRAL BILAT MOD SED  07/27/2017     Social History:   reports that she has never smoked. She  has never used smokeless tobacco. She reports that she does not drink alcohol and does not use drugs.   Family History:  Her family history includes Hypertension in her sister.   Allergies No Known Allergies   Home Medications  Prior to Admission medications   Medication Sig Start Date End Date Taking? Authorizing Provider  acetaminophen (TYLENOL) 500 MG tablet Take 500 mg by mouth every 6 (six) hours as needed for moderate pain or mild pain.    [provider]  amLODipine (NORVASC) 5 MG tablet Take 1 tablet (  5 mg total) by mouth daily. 03/03/22 06/01/22  Rema Fendt, NP  aspirin EC 81 MG tablet Take 1 tablet (81 mg total) by mouth daily. Swallow whole. 02/06/22   Lonia Blood, MD  atorvastatin (LIPITOR) 80 MG tablet Take 1 tablet (80 mg total) by mouth daily. 02/06/22   Lonia Blood, MD  clopidogrel (PLAVIX) 75 MG tablet Take 1 tablet (75 mg total) by mouth daily. 02/06/22   Lonia Blood, MD     Critical care time: 40 minutes    Heber Bridger, MD [facility] Pager: [phone] Cell: [phone] After 7:00 pm call Elink  [phone]

## 2022-03-14 NOTE — Progress Notes (Signed)
RT NOTE:  Pt transported to CT and back to ER without event 

## 2022-03-14 NOTE — Progress Notes (Signed)
Patient transported from ER via the ventilator to 7N17 with no complications.

## 2022-03-14 NOTE — IPAL (Signed)
  Interdisciplinary Goals of Care Family Meeting   Date carried out:: 03/11/2022  Location of the meeting: Bedside  Member's involved: Physician and Family Member or next of kin  Durable Power of Attorney or acting medical decision maker: daughter Key and the patient's pastor (who is also a Investment banker, operational)  Discussion: We discussed goals of care for [facility] .  We discussed the fact that she has a disease that man cannot cure and she will eventually progress to brain death or cardiac arrest.  The intervention of CPR in the event of cardiac arrest would cause unnecessary pain and suffering without medical benefit so we will not offer that.  The patient's daughter voiced understanding.    Code status: Full DNR  Disposition: Continue current acute care : will most likely progress to brain death, will have ongoing conversations with the patient's family about withdrawal of care.  They are not ready for that yet as family are coming from out of town.    Time spent for the meeting: 20 minutes  Roselie Awkward 03/11/2022, 10:04 PM

## 2022-03-14 NOTE — ED Triage Notes (Signed)
Pt coming in with [facility]. Pt was washing dishes at home and walked into her bedroom. She was found down in her bedroom unresponsive with jerky motions on one side of body. Fire given 2 mg Narcan. EMS arrived and found her to be seizing, given 5 mg midazolam IM, 5 mg albuterol. Pt has hx of stroke with right sided deficits.   BP 250/150 HR 110 CBG 148

## 2022-03-14 NOTE — ED Provider Notes (Signed)
  [facility] EMERGENCY DEPARTMENT Provider Note   CSN: [id number] Arrival date & time: March 22, 2022  1848     History {Add pertinent medical, surgical, social history, OB history to HPI:1} Chief Complaint  Patient presents with   seizure/unequal pupils    Diane Berry is a 61 y.o. female.  HPI     Home Medications Prior to Admission medications   Medication Sig Start Date End Date Taking? Authorizing Provider  acetaminophen (TYLENOL) 500 MG tablet Take 500 mg by mouth every 6 (six) hours as needed for moderate pain or mild pain.    [provider]  amLODipine (NORVASC) 5 MG tablet Take 1 tablet (5 mg total) by mouth daily. 03/03/22 06/01/22  Camillia Herter, NP  aspirin EC 81 MG tablet Take 1 tablet (81 mg total) by mouth daily. Swallow whole. 02/06/22   Cherene Altes, MD  atorvastatin (LIPITOR) 80 MG tablet Take 1 tablet (80 mg total) by mouth daily. 02/06/22   Cherene Altes, MD  clopidogrel (PLAVIX) 75 MG tablet Take 1 tablet (75 mg total) by mouth daily. 02/06/22   Cherene Altes, MD      Allergies    Patient has no known allergies.    Review of Systems   Review of Systems  Physical Exam Updated Vital Signs BP (!) 204/125   Pulse (!) 122   Resp 19   SpO2 100%  Physical Exam  ED Results / Procedures / Treatments   Labs (all labs ordered are listed, but only abnormal results are displayed) Labs Reviewed  I-STAT CHEM 8, ED - Abnormal; Notable for the following components:      Result Value   Potassium 2.9 (*)    Creatinine, Ser 1.10 (*)    Glucose, Bld 186 (*)    TCO2 21 (*)    All other components within normal limits  COMPREHENSIVE METABOLIC PANEL  CBC  ETHANOL  URINALYSIS, ROUTINE W REFLEX MICROSCOPIC  LACTIC ACID, PLASMA  PROTIME-INR  TRIGLYCERIDES  SAMPLE TO BLOOD BANK    EKG None  Radiology No results found.  Procedures Procedures  {Document cardiac monitor, telemetry assessment procedure when  appropriate:1}  Medications Ordered in ED Medications  etomidate (AMIDATE) injection (20 mg Intravenous Given 2022-03-22 1917)  propofol (DIPRIVAN) 1000 MG/100ML infusion (has no administration in time range)    ED Course/ Medical Decision Making/ A&P                           Medical Decision Making Amount and/or Complexity of Data Reviewed Labs: ordered. Radiology: ordered.  Risk Prescription drug management.   ***  {Document critical care time when appropriate:1} {Document review of labs and clinical decision tools ie heart score, Chads2Vasc2 etc:1}  {Document your independent review of radiology images, and any outside records:1} {Document your discussion with family members, caretakers, and with consultants:1} {Document social determinants of health affecting pt's care:1} {Document your decision making why or why not admission, treatments were needed:1} Final Clinical Impression(s) / ED Diagnoses Final diagnoses:  None    Rx / DC Orders ED Discharge Orders     None

## 2022-03-14 NOTE — H&P (Addendum)
Neurology H&P  CC: ICH  History is obtained from: Chart, daughter  HPI: Diane Berry is a 61 y.o. female with a history of previous stroke and hypertension who presents with sudden onset dizziness and confusion leading to unresponsiveness this evening.  She was last in her normal state of health about 5:50 PM, then complained of dizziness and collapsed.  She was brought in by EMS where she was intubated. CT reveals massive ICH. She had been steadily rehabilitating from her stroke, still needing assistance to walk with daughter.    LKW: 5:50 PM tpa given?: No, ICH IR Thrombectomy? No, ICH Modified Rankin Scale: 4-Needs assistance to walk and tend to bodily needs NIHSS: 36 ICH score: 4  Past Medical History:  Diagnosis Date   Stroke (HCC) 07/24/2017   intraventricular hemorrhage     Family History  Problem Relation Age of Onset   Hypertension Sister      Social History:  reports that she has never smoked. She has never used smokeless tobacco. She reports that she does not drink alcohol and does not use drugs.   Prior to Admission medications   Medication Sig Start Date End Date Taking? Authorizing Provider  acetaminophen (TYLENOL) 500 MG tablet Take 500 mg by mouth every 6 (six) hours as needed for moderate pain or mild pain.    [provider]  amLODipine (NORVASC) 5 MG tablet Take 1 tablet (5 mg total) by mouth daily. 03/03/22 06/01/22  Rema Fendt, NP  aspirin EC 81 MG tablet Take 1 tablet (81 mg total) by mouth daily. Swallow whole. 02/06/22   Lonia Blood, MD  atorvastatin (LIPITOR) 80 MG tablet Take 1 tablet (80 mg total) by mouth daily. 02/06/22   Lonia Blood, MD  clopidogrel (PLAVIX) 75 MG tablet Take 1 tablet (75 mg total) by mouth daily. 02/06/22   Lonia Blood, MD     Exam: Current vital signs: BP (!) 207/104   Pulse 81   Resp 18   Ht 4\' 9"  (1.448 m)   SpO2 100%   BMI 22.72 kg/m    Physical Exam  Constitutional: Appears  elderly Psych: Affect appropriate to situation Eyes: No scleral injection HENT: ET tube in place Head: Normocephalic.  Cardiovascular: Normal rate and regular rhythm.  Respiratory: With the ventilator changed to pressure support, she does give ventilatory effort GI: Soft.  No distension. There is no tenderness.  Skin: WDI  Neuro: Mental Status: Patient is comatose, does not open eyes or follow commands Cranial Nerves: II: Does not blink to threat, pupils are fixed and dilated bilaterally, irregular on the left.   III,IV, VI: No movement to doll's eye maneuver V: VII: Corneals are absent Motor: No response to noxious stimulation Sensory: As above Cerebellar: Does not perform    I have reviewed labs in epic and the pertinent results are: Potassium 2.9 Creatinine 1.1  I have reviewed the images obtained: CT head -massive ICH  Primary Diagnosis:  Nontraumatic intracerebral hemorrhage in hemisphere, subcortical  Secondary Diagnosis: Essential (primary) hypertension, Hypertension Emergency (SBP > 180 or DBP > 120 & end organ damage), and Hypokalemia   Impression: 61 year old female with a history of hypertension who presents with massive intracranial hemorrhage.  This is not a survivable hemorrhage.  I discussed this with the family, but they are not ready to make any decisions regarding comfort care.  Her ventilatory drive is currently precluding the diagnosis of brain death, but I suspect she will progress to brain death  fairly quickly.  Plan: 1) Admit to ICU 2) no antiplatelets or anticoagulants 3) blood pressure control with goal systolic 939 - 030 4) Frequent neuro checks 5) If symptoms worsen or there is decreased mental status, repeat stat head CT 6) PT,OT,ST 7) at family's request I will discuss with neurosurgery, but think that any type of intervention at this point would be futile.   This patient is critically ill and at significant risk of neurological  worsening, death and care requires constant monitoring of vital signs, hemodynamics,respiratory and cardiac monitoring, neurological assessment, discussion with family, other specialists and medical decision making of high complexity. I spent 65 minutes of neurocritical care time  in the care of  this patient. This was time spent independent of any time provided by nurse practitioner or PA.  Roland Rack, MD [facility] [phone]  If 7pm- 7am, please page neurology on call as listed in [place].  Addendum: Discussed with neurosurgery who agree no surgical intervention is indicated at this time. I have discussed this with the family. For now, she remains full code.   Roland Rack, MD [facility] [phone]  If 7pm- 7am, please page neurology on call as listed in [place].

## 2022-03-14 NOTE — ED Provider Notes (Signed)
This patient is a 61 year old female, recently admitted to the hospital at the end of August 2023 after having a stroke and hypertensive urgency.  During that time the patient was started on medications and is currently supposed to be taking a statin, amlodipine, and Plavix.  It is unclear whether she is taking these medications that she had empty bottles at home and the paramedics report that family members at the scene stated that she had been out of her medications and not able to get them refilled.  The daughter who has come to the hospital to be with her mother states that this morning she seemed to be her normal self, family members at home witnessed her washing dishes earlier in the day, she was found unresponsive on the floor actively seizing and status epilepticus.  She was given 5 mg of intramuscular Versed and the seizure activity stopped however the patient was noted to have asymmetric pupils and has been posturing for the paramedics in route to the hospital.  She is unresponsive and not responsive to painful stimuli.  She is maintaining an airway prehospital.  On my exam the patient has some haziness over the left cornea, a smaller pupil on the left and a larger pupil on the right, unresponsive to light, she is clenching her teeth, she has a nasal trumpet in place and is breathing spontaneously though slowly.  She did not respond to Narcan.  This was done in the field.  She has clear heart sounds, clear lung sounds, she is slightly tachycardic, she has a soft nontender abdomen, her arms and legs appear to have no signs of trauma nor does her head.  She has posturing of the arms and the legs which are all straight.  The patient was intubated by the resident physician under my direct supervision under direct laryngoscopy on the first attempt.  She will go to CT scan to look at her brain and her cervical spine, labs been ordered, the patient will need to have intensive care unit care, she is  critically ill.  I suspect that she has an intracranial abnormality or hemorrhage or just has status epilepticus after having a recent stroke.  Her blood pressure is 250/140, Cleviprex has been ordered.  I have personally viewed and interpreted the CT scan of the brain which shows that the patient has a large amount of intracranial blood including the ventricles but the lateral ventricles as well as what appears to be the third and fourth ventricles.  She has ongoing severe hypertension and is currently on Cleviprex, current blood pressure is 204/125, adding labetalol and getting neurology's assistance as well.  She will need to go to ICU.  She has a very poor prognosis unfortunately  .Critical Care  Performed by: Noemi Chapel, MD Authorized by: Noemi Chapel, MD   Critical care provider statement:    Critical care time (minutes):  30   Critical care time was exclusive of:  Separately billable procedures and treating other patients and teaching time   Critical care was necessary to treat or prevent imminent or life-threatening deterioration of the following conditions:  Respiratory failure and CNS failure or compromise   Critical care was time spent personally by me on the following activities:  Development of treatment plan with patient or surrogate, discussions with consultants, evaluation of patient's response to treatment, examination of patient, ordering and review of laboratory studies, ordering and review of radiographic studies, ordering and performing treatments and interventions, pulse oximetry, re-evaluation of patient's  condition, review of old charts, blood draw for specimens and obtaining history from patient or surrogate   I assumed direction of critical care for this patient from another provider in my specialty: no     Care discussed with: admitting provider   Comments:          Final diagnoses:  Intracranial hemorrhage (HCC)  Hypertensive crisis      Eber Hong,  MD 03/15/22 1534

## 2022-03-15 ENCOUNTER — Inpatient Hospital Stay (HOSPITAL_COMMUNITY): Payer: Medicaid Other

## 2022-03-15 LAB — TRIGLYCERIDES: Triglycerides: 68 mg/dL (ref <150)

## 2022-03-15 LAB — HIV ANTIBODY (ROUTINE TESTING W REFLEX): HIV Screen 4th Generation wRfx: NONREACTIVE

## 2022-03-15 LAB — GLUCOSE, CAPILLARY
Glucose-Capillary: 163 mg/dL — ABNORMAL HIGH (ref 70–99)
Glucose-Capillary: 84 mg/dL (ref 70–99)

## 2022-03-15 LAB — MRSA NEXT GEN BY PCR, NASAL: MRSA by PCR Next Gen: NOT DETECTED

## 2022-03-15 LAB — LACTIC ACID, PLASMA: Lactic Acid, Venous: 3.3 mmol/L (ref 0.5–1.9)

## 2022-03-15 LAB — MAGNESIUM: Magnesium: 1.8 mg/dL (ref 1.7–2.4)

## 2022-03-15 MED ORDER — SODIUM CHLORIDE 0.9 % IV BOLUS
1000.0000 mL | Freq: Once | INTRAVENOUS | Status: AC
  Administered 2022-03-15: 1000 mL via INTRAVENOUS

## 2022-03-15 NOTE — Consult Note (Addendum)
NAMEAggie Berry, MRN:  [id number], DOB:  1961-04-24, LOS: 1 ADMISSION DATE:  03-27-2022, CONSULTATION DATE: 2022/03/27 REFERRING MD: Amada Jupiter, CHIEF COMPLAINT: Unresponsive  History of Present Illness:  61 year old female with a prior history of stroke developed sudden onset confusion, dizziness and ultimately went unresponsive.  Was brought to the emergency department where she was found to have a massive intracranial hemorrhage.  She has brainstem compression and herniation.  She has been seen by neurology and the case has been discussed with neurosurgery.  Both of those teams feel that there are no medical interventions which will prevent her death.  At this time the patient's family is requesting admission so that family members can come and see her.  Pulmonary and critical care medicine is consulted for management of the ventilator.    Pertinent  Medical History  Stroke Hypertension Cognitive decline Microcytic hypochromic anemia Chronic chest and abdominal pain Hyperlipidemia  Significant Hospital Events: Including procedures, antibiotic start and stop dates in addition to other pertinent events   October 7 admission, intubation for airway protection.  CT head showed large left basal ganglia hemorrhage measuring 4.2 x 6.2 x 4.5 cm with estimated volume approximately 500 mL.  Extensive intraventricular extension of the hemorrhage, mass effect with effacement of sulci, midline shift measuring 8 mmHg hydrocephalus with ballooning of the right temporal tip and crowding of the foramen magnum. 10/8 no cough, gag, dolls eyes, pupils, spontaneous respirations  Interim History / Subjective:  Neurologic devasation, no brainstem reflexes on exam. Recommended comfort care to daughter in room. She is calling in family.  Objective   Blood pressure (!) 86/61, pulse 89, temperature (!) 97.5 F (36.4 C), resp. rate (!) 24, height 4\' 9"  (1.448 m), weight 47.3 kg, SpO2 100 %.    Vent Mode:  PRVC FiO2 (%):  [40 %-100 %] 40 % Set Rate:  [18 bmp-24 bmp] 24 bmp Vt Set:  [310 mL] 310 mL PEEP:  [5 cmH20] 5 cmH20 Plateau Pressure:  [13 cmH20] 13 cmH20   Intake/Output Summary (Last 24 hours) at 03/15/2022 1043 Last data filed at 03/15/2022 0900 Gross per 24 hour  Intake 1299.62 ml  Output 2075 ml  Net -775.38 ml    Filed Weights   27-Mar-2022 2213  Weight: 47.3 kg    Examination:  General:  In bed on vent HENT: NCAT ETT in place PULM: CTA B, vent supported breathing CV: RRR, no mgr GI: BS+, soft, nontender MSK: normal bulk and tone Neuro: no response to noxious stimuli, no brainstem reflexes   Resolved Hospital Problem list     Assessment & Plan:  Massive intracerebral hemorrhage, ICH score 5 Brain compression Hypertension Acute respiratory failure with hypoxemia due to inability to protect airway Acute encephalopathy secondary to massive hemorrhagic stroke Baseline cognitive decline Irreversible severe brain injury, 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  Plan: Neurology primary, appreciate assistance PRVC, vent setting titrated for alkalemia iwht normal pH and still is apneic Recommend comfort care, ongoing goals of care BP management per neurology Hypertonic saline not indicated as it will not be effective Will not order more labs due to futility Happy to assist with comfort care orders if this is course of care pursued    Best Practice (right click and "Reselect all SmartList Selections" daily)   Per primary  Labs   CBC: Recent Labs  Lab 03-27-22 1909 2022/03/27 1918 March 27, 2022 2023  WBC 17.9*  --   --   HGB 10.9* 12.9 11.2*  HCT 35.4* 38.0  33.0*  MCV 73.6*  --   --   PLT 274  --   --      Basic Metabolic Panel: Recent Labs  Lab 03/26/2022 1909 03/25/2022 1918 03/27/2022 2023 03/15/22 0340  NA 142 143 142  --   K 2.9* 2.9* 3.0*  --   CL 108 107  --   --   CO2 20*  --   --   --   GLUCOSE 185* 186*  --   --   BUN 12 13  --   --   CREATININE 1.14* 1.10*  --    --   CALCIUM 9.4  --   --   --   MG  --   --   --  1.8    GFR: Estimated Creatinine Clearance: 34.8 mL/min (A) (by C-G formula based on SCr of 1.1 mg/dL (H)). Recent Labs  Lab 03/15/2022 1909 03/15/22 0340  WBC 17.9*  --   LATICACIDVEN  --  3.3*     Liver Function Tests: Recent Labs  Lab 04/05/2022 1909  AST 38  ALT 10  ALKPHOS 117  BILITOT 0.7  PROT 8.4*  ALBUMIN 4.5    No results for input(s): "LIPASE", "AMYLASE" in the last 168 hours. No results for input(s): "AMMONIA" in the last 168 hours.  ABG    Component Value Date/Time   PHART 7.258 (L) 04/07/2022 2023   PCO2ART 58.9 (H) 04/01/2022 2023   PO2ART 370 (H) 04/07/2022 2023   HCO3 26.4 03/27/2022 2023   TCO2 28 04/04/2022 2023   ACIDBASEDEF 2.0 04/07/2022 2023   O2SAT 100 04/01/2022 2023     Coagulation Profile: Recent Labs  Lab 04/07/2022 1909  INR 0.9     Cardiac Enzymes: No results for input(s): "CKTOTAL", "CKMB", "CKMBINDEX", "TROPONINI" in the last 168 hours.  HbA1C: Hemoglobin A1C  Date/Time Value Ref Range Status  12/02/2018 09:07 AM 5.1 4.0 - 5.6 % Final   Hgb A1c MFr Bld  Date/Time Value Ref Range Status  02/03/2022 03:35 AM 4.9 4.8 - 5.6 % Final    Comment:    (NOTE) Pre diabetes:          5.7%-6.4%  Diabetes:              >6.4%  Glycemic control for   <7.0% adults with diabetes   07/24/2017 06:59 AM 5.1 4.8 - 5.6 % Final    Comment:    (NOTE) Pre diabetes:          5.7%-6.4% Diabetes:              >6.4% Glycemic control for   <7.0% adults with diabetes     CBG: Recent Labs  Lab 03/08/2022 2322 03/15/22 0433 03/15/22 0746  GLUCAP 182* 163* 84    Review of Systems:   Cannot obtain  Past Medical History:  She,  has a past medical history of Stroke (HCC) (07/24/2017).   Surgical History:   Past Surgical History:  Procedure Laterality Date   IR ANGIO INTRA EXTRACRAN SEL COM CAROTID INNOMINATE BILAT MOD SED  07/27/2017   IR ANGIO VERTEBRAL SEL VERTEBRAL BILAT MOD SED   07/27/2017     Social History:   reports that she has never smoked. She has never used smokeless tobacco. She reports that she does not drink alcohol and does not use drugs.   Family History:  Her family history includes Hypertension in her sister.   Allergies No Known Allergies   Home Medications  Prior to Admission medications   Medication Sig Start Date End Date Taking? Authorizing Provider  acetaminophen (TYLENOL) 500 MG tablet Take 500 mg by mouth every 6 (six) hours as needed for moderate pain or mild pain.    [provider]  amLODipine (NORVASC) 5 MG tablet Take 1 tablet (5 mg total) by mouth daily. 03/03/22 06/01/22  Camillia Herter, NP  aspirin EC 81 MG tablet Take 1 tablet (81 mg total) by mouth daily. Swallow whole. 02/06/22   Cherene Altes, MD  atorvastatin (LIPITOR) 80 MG tablet Take 1 tablet (80 mg total) by mouth daily. 02/06/22   Cherene Altes, MD  clopidogrel (PLAVIX) 75 MG tablet Take 1 tablet (75 mg total) by mouth daily. 02/06/22   Cherene Altes, MD     Critical care time:     CRITICAL CARE Performed by: Lanier Clam   Total critical care time: 30 minutes  Critical care time was exclusive of separately billable procedures and treating other patients.  Critical care was necessary to treat or prevent imminent or life-threatening deterioration.  Critical care was time spent personally by me on the following activities: development of treatment plan with patient and/or surrogate as well as nursing, discussions with consultants, evaluation of patient's response to treatment, examination of patient, obtaining history from patient or surrogate, ordering and performing treatments and interventions, ordering and review of laboratory studies, ordering and review of radiographic studies, pulse oximetry and re-evaluation of patient's condition.   Lanier Clam, MD [facility] See Amion for contact info

## 2022-03-15 NOTE — Plan of Care (Signed)
  Problem: Clinical Measurements: Goal: Ability to maintain clinical measurements within normal limits will improve Outcome: Progressing   

## 2022-03-15 NOTE — Progress Notes (Signed)
BP dropped below goals. Dr Leonel Ramsay notified after Cleviprex and Propofol were stopped. New order for 1L of NS bolus given. See MAR. RN to contnue to monitor.

## 2022-03-15 NOTE — Significant Event (Addendum)
Return to bedside this afternoon at patient family request.  Particularly for the patient's brother.  I introduced myself as the intensive care unit physician.  Interpreter was present but he spoke in [place] for the duration of our conversation.  I explained that the patient has irreversible brain hemorrhage or brain bleed.  That no interventions that any man or woman could provide would change the brain damage is already occurred.  She has no brainstem reflexes on exam.  There is nothing else to offer.  He states she is breathing.  I tell him that the ventilator is breathing for her.  She is apneic when demand mode of ventilation is not present.  He states that we must do surgery to get the blood out of her brain.  I explained this is not possible.  Surgery not an option.  The size and location of her brain bleed is nonsurvivable.  He continues to say that we need to try to do surgery.  I said there is no trying, is not an option.  The patient has been evaluated by 2 neurologist, 2 critical care physicians, and neurosurgeon.  We are all in agreement that there is no intervention we can offer that we will change the outcome.  He request transfer to other center.  I explained that we have the adequate resources and expertise here to provide care for the patient.  I encouraged him to reach out to other centers and request transfer if he wishes.  He requests to speak to someone else.  Dr. Leonie Man came to the bedside.  I provided number for patient experiences, patient advocate line.  I encouraged him to call that number to speak with some else.  I reiterated that this brain hemorrhage is not survival.  I offered my sincere condolences.

## 2022-03-15 NOTE — Progress Notes (Signed)
   03/15/22 1745  Clinical Encounter Type  Visited With Health care provider;Family;Patient not available  Visit Type Spiritual support;Initial  Referral From Nurse Lavona Mound, RN)  Consult/Referral To Chaplain Melvenia Beam)  Spiritual Encounters  Spiritual Needs Emotional;Grief support   Chaplain accompanied [facility] to meet with family of Ms. Tymber Burry.   Ms. Cowles daughter had previously agreed with prognosis and transitioning her mother to comfort care and listing her mother as DNR. Family gathered in consultation room and then began to question if comfort care and DNR were appropriate course of care. Medical staff discussed that patient has brainstem compression and herniation.  She has been seen by neurology and the case has been discussed with neurosurgery.  Both of those teams feel that there are no medical interventions which will prevent her death.  Chaplain provided meaningful presence and reflective listening; shared in compassionate and empathetic discussion of Ms. Degraaf's quality of life even if surgery was done and asked to family to consider the extent or escalation of care that their loved one would want if she were making the decision for herself. Asked family to consider how they would like to spend the remaining time of her life with her. Chaplain shared is heartfelt condolences with family. [address] [place], [place] [phone]

## 2022-03-15 NOTE — Progress Notes (Addendum)
STROKE TEAM PROGRESS NOTE   INTERVAL HISTORY Her daughter and granddaughter are  at the bedside.   BP has been low overnight, Received fluid boluses last pm. 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  Very Poor neurological exam. skew eye deviation.Pupils non reactive, corneals absent bilateral No cough / or gag, no movement to noxious stimuli.   Sedation has been off since last pm.   Patient is DNR, no escalation of care. Waiting for her family to get here from [place] prior to withdrawing ventilatory support after discussion with daughter Family updated by Dr. Leonie Man   Vitals:   03/15/22 0630 03/15/22 0645 03/15/22 0800 03/15/22 0842  BP: 92/63 95/64    Pulse: 87 85    Resp: (!) 24 (!) 24    Temp: (!) 95.9 F (35.5 C) (!) 95.5 F (35.3 C) 99.1 F (37.3 C)   TempSrc:   Axillary   SpO2: 100% 100%  100%  Weight:      Height:       CBC:  Recent Labs  Lab 2022-03-18 1909 03-18-2022 1918 2022/03/18 2023  WBC 17.9*  --   --   HGB 10.9* 12.9 11.2*  HCT 35.4* 38.0 33.0*  MCV 73.6*  --   --   PLT 274  --   --    Basic Metabolic Panel:  Recent Labs  Lab 18-Mar-2022 1909 2022-03-18 1918 2022/03/18 2023 03/15/22 0340  NA 142 143 142  --   K 2.9* 2.9* 3.0*  --   CL 108 107  --   --   CO2 20*  --   --   --   GLUCOSE 185* 186*  --   --   BUN 12 13  --   --   CREATININE 1.14* 1.10*  --   --   CALCIUM 9.4  --   --   --   MG  --   --   --  1.8   Lipid Panel:  Recent Labs  Lab 03/15/22 0340  TRIG 68   HgbA1c: No results for input(s): "HGBA1C" in the last 168 hours. Urine Drug Screen:  Recent Labs  Lab 03/18/22 2130  LABOPIA NONE DETECTED  COCAINSCRNUR NONE DETECTED  LABBENZ POSITIVE*  AMPHETMU NONE DETECTED  THCU NONE DETECTED  LABBARB NONE DETECTED    Alcohol Level  Recent Labs  Lab 03-18-2022 1909  ETH <10    IMAGING past 24 hours CT HEAD WO CONTRAST (5MM)  Result Date:  03/15/2022 CLINICAL DATA:  Head trauma EXAM: CT HEAD WITHOUT CONTRAST TECHNIQUE: Contiguous axial images were obtained from the base of the skull through the vertex without intravenous contrast. RADIATION DOSE REDUCTION: This exam was performed according to the departmental dose-optimization program which includes automated exposure control, adjustment of the mA and/or kV according to patient size and/or use of iterative reconstruction technique. COMPARISON:  Head CT from yesterday FINDINGS: Brain: Massive intracranial hemorrhage centered in the left deep cerebrum with intraventricular clot that is diffuse and progressed. Dissecting hemorrhage in the brainstem and right cortical spinal radiations. There is hydrocephalus and diffuse edematous white matter. Worsening of elevated intracranial pressure with diffuse effacement of CSF and downward central descent. Vascular: No hyperdense vessel or unexpected calcification. Skull: Normal. Negative for fracture or focal lesion. Sinuses/Orbits: No acute finding. IMPRESSION: Massive intracranial hemorrhage with hydrocephalus. Worsening intraventricular clot and elevated intracranial pressure since yesterday. Electronically Signed   By: Gilford Silvius.D.  On: 03/15/2022 05:44   DG Abd Portable 1V  Result Date: 03/23/2022 CLINICAL DATA:  Trauma, found down EXAM: PORTABLE ABDOMEN - 1 VIEW COMPARISON:  None Available. FINDINGS: Bowel gas pattern is nonspecific. Tip of enteric tube is seen in the region of body of stomach. There is moderate amount of gas in stomach. There is no small bowel dilation. Moderate amount of stool is seen in right colon. There is no fecal impaction in rectosigmoid. No abnormal masses or calcifications are seen. Kidneys are partly obscured by bowel contents. IMPRESSION: Nonspecific bowel gas pattern. Tip of enteric tube is seen in the region of body of stomach. Electronically Signed   By: Elmer Picker M.D.   On: 03/30/2022 20:37   DG  Pelvis Portable  Result Date: 03/11/2022 CLINICAL DATA:  Trauma, found down EXAM: PORTABLE PELVIS 1-2 VIEWS COMPARISON:  None Available. FINDINGS: There is no evidence of pelvic fracture or diastasis. No pelvic bone lesions are seen. IMPRESSION: No fracture or dislocation is seen in pelvis. Electronically Signed   By: Elmer Picker M.D.   On: 03/27/2022 20:35   DG Chest Port 1 View  Result Date: 03/22/2022 CLINICAL DATA:  Trauma, found down EXAM: PORTABLE CHEST 1 VIEW COMPARISON:  02/03/2022 FINDINGS: Transverse diameter of heart is slightly increased. Tip of endotracheal tube is 2.8 cm above the carina. Enteric tube is noted traversing the esophagus with its distal portion in the stomach. There are linear densities in medial left lower lung field. There is no focal consolidation. There are no signs of pulmonary edema. There is no pleural effusion or pneumothorax. IMPRESSION: Linear densities in medial left lower lung fields may suggest subsegmental atelectasis. Other findings as described in the body of the report. Electronically Signed   By: Elmer Picker M.D.   On: 03/24/2022 20:34   CT CERVICAL SPINE WO CONTRAST  Result Date: 03/28/2022 CLINICAL DATA:  Patient found down. EXAM: CT CERVICAL SPINE WITHOUT CONTRAST TECHNIQUE: Multidetector CT imaging of the cervical spine was performed without intravenous contrast. Multiplanar CT image reconstructions were also generated. RADIATION DOSE REDUCTION: This exam was performed according to the departmental dose-optimization program which includes automated exposure control, adjustment of the mA and/or kV according to patient size and/or use of iterative reconstruction technique. COMPARISON:  None Available. FINDINGS: Alignment: No significant listhesis is present. Straightening of the normal cervical lordosis may be positional as the patient is an a hard collar. Skull base and vertebrae: Vertebral body heights are normal. Diffuse osteopenia is  present. No acute fractures are present. Soft tissues and spinal canal: Endotracheal tube is in place. NG tube is noted. Disc levels: Uncovertebral spurring contributes to mild foraminal narrowing at C5-6 bilaterally. Upper chest: Lung apices are clear. Thoracic inlet is within normal limits. IMPRESSION: 1. No acute fracture or traumatic subluxation. 2. Diffuse osteopenia. 3. Mild foraminal narrowing at C5-6 bilaterally. Electronically Signed   By: San Morelle M.D.   On: 03/12/2022 20:02   CT HEAD WO CONTRAST  Result Date: 03/26/2022 CLINICAL DATA:  Head trauma, moderate to severe. Patient was found down in her bedroom, unresponsive. Right-sided deficits. EXAM: CT HEAD WITHOUT CONTRAST TECHNIQUE: Contiguous axial images were obtained from the base of the skull through the vertex without intravenous contrast. RADIATION DOSE REDUCTION: This exam was performed according to the departmental dose-optimization program which includes automated exposure control, adjustment of the mA and/or kV according to patient size and/or use of iterative reconstruction technique. COMPARISON:  MR head without contrast 02/03/2022. FINDINGS: Brain: A  large left basal ganglia hemorrhage measures 4.2 by 6.2 x 4.5 cm. Estimated volume is approximately 500 mL. Intraventricular extension is noted with blood throughout the left lateral ventricle. Layering blood is present in the posterior horns of both lateral ventricles. Blood is present in the third and fourth ventricles as well. Mass effect is present with effacement the sulci. Midline shift measures 8 mm. Hydrocephalus is present with ballooning of the right temporal tip. The cerebellar tonsils are crowding the foramen magnum. Vascular: Atherosclerotic calcifications are present within the cavernous internal carotid arteries bilaterally. No hyperdense vessel is present. Skull: Calvarium is intact. No focal lytic or blastic lesions are present. No significant extracranial soft  tissue lesion is present. Sinuses/Orbits: The paranasal sinuses and mastoid air cells are clear. Elongated left globe again seen. Orbits are otherwise unremarkable. IMPRESSION: 1. Large left basal ganglia hemorrhage measuring 4.2 by 6.2 x 4.5 cm. Estimated volume is approximately 500 mL. 2. Intraventricular extension of hemorrhage. 3. Mass effect with effacement the sulci. 4. Midline shift measures 8 mm. 5. Hydrocephalus with ballooning of the right temporal tip. 6. Crowding of the foramen magnum. Electronically Signed   By: San Morelle M.D.   On: 03/12/2022 19:59    PHYSICAL EXAM  Temp:  [95.5 F (35.3 C)-99.1 F (37.3 C)] 97.5 F (36.4 C) (10/08 1015) Pulse Rate:  [60-125] 89 (10/08 1015) Resp:  [15-39] 24 (10/08 1015) BP: (84-265)/(57-196) 86/61 (10/08 1015) SpO2:  [99 %-100 %] 100 % (10/08 1015) FiO2 (%):  [40 %-100 %] 40 % (10/08 0842) Weight:  [47.3 kg] 47.3 kg (10/07 2213)  General - critically ill elderly female, intubated on no sedation Cardiovascular - Regular rhythm and rate.  Neuro: Poor neurological exam. Intubated on no sedation.  Eyes closed.  Comatose.  Unresponsive. Skew eye deviation with left eye hypertrophic, pupils small and non reactive, corneals absent bilaterally,  negative dolls eyes, No cough or gag, no breathing over the ventilator, no movement or response to noxious stimuli.   ASSESSMENT/PLAN Ms. Teresea Hudler is a 61 y.o. female with history of  previous stroke and hypertension who presents with sudden onset dizziness and confusion leading to unresponsiveness this evening.  LKW 1750pm  Stroke: Massive left basal ganglia ICH with IVH , hydrocephalus , cytotoxic cerebral edema and brain herniation . ICH score 4 on admission Etiology:  likely hypertensive   CT head  1. Large left basal ganglia hemorrhage measuring 4.2 by 6.2 x 4.5 cm. Estimated volume is approximately 500 mL. 2. Intraventricular extension of hemorrhage. 3. Mass effect with effacement  the sulci. 4. Midline shift measures 8 mm. 5. Hydrocephalus with ballooning of the right temporal tip. 6. Crowding of the foramen magnum.  Stability scan CT head -Massive intracranial hemorrhage with hydrocephalus. Worsening intraventricular clot and elevated intracranial pressure since yesterday.   2D Echo 8/29: EF 60-65%. mild concentric left ventricular hypertrophy. Left  ventricular diastolic parameters are consistent with Grade I diastolic dysfunction   LDL 131 HgbA1c 4.9 VTE prophylaxis - SCD's    Diet   Diet NPO time specified   aspirin 81 mg daily and clopidogrel 75 mg daily prior to admission, now on No antithrombotic.  Therapy recommendations:  pending Disposition:  pending  IVH Neurosurgery consulted. No medical interventions due to hemorrhage is not survivable Poor neurological exam. Patient is DNR with no escalation of care   Hypertension Hypotension Home meds:  amlodipine  UnStable Initially requiring cleviprex gtt- off now since hypotensive SBP goal 130-150 Received fluid boluses overnight  due to hypotension Long-term BP goal normotensive  Hyperlipidemia Home meds:  atorvastatin, not resumed in hospital LDL 131, goal < 70 Continue statin at discharge  Acute Respiratory Failure with hypoxia Managed by CCM  Sedation off since last pm due to hypotension   Other Stroke Risk Factors Advanced Age >/= 31  Hx stroke/TIA   Other Active Problems Dysphagia   Hospital day # 1  Beulah Gandy DNP, ACNPC-AG  STROKE MD NOTE :  I have personally obtained history,examined this patient, reviewed notes, independently viewed imaging studies, participated in medical decision making and plan of care.ROS completed by me personally and pertinent positives fully documented  I have made any additions or clarifications directly to the above note. Agree with note above.  Patient presented with unresponsiveness secondary to massive left basal ganglia hemorrhage with  intraventricular hemorrhage, hydrocephalus and transfalcine, uncal and central brain herniation with poor neurological exam compatible with brain death.  Family understands poor prognosis and awaiting arrival of other family members later today prior to withdrawing ventilatory support.  They agree to DNR and no escalation of care.  Long discussion with the patient's daughter and granddaughter at the bedside as well as with Dr. Silas Flood critical care medicine who agrees with the plan.This patient is critically ill and at significant risk of neurological worsening, death and care requires constant monitoring of vital signs, hemodynamics,respiratory and cardiac monitoring, extensive review of multiple databases, frequent neurological assessment, discussion with family, other specialists and medical decision making of high complexity.I have made any additions or clarifications directly to the above note.This critical care time does not reflect procedure time, or teaching time or supervisory time of PA/NP/Med Resident etc but could involve care discussion time.  I spent 50 minutes of neurocritical care time  in the care of  this patient.  ADDENDUM  4 pm : I returned to the bedside and spoke to the patient's brother who had come from [place].  I explained the patient's poor prognosis and neurological exam consistent with brain death but he refused to accept the fact that ventilatory support needed to be withdrawn.  He stated that he would not be willing to give up and we needed to do something like doing brain surgery and taking the blood clot out.  I explained to him that surgery at this point would unlikely be of any benefit and we had already discussed the case with neurosurgery last night but he insisted on talking himself so I called Dr.Dawley neurosurgeon spoke to the patient's brother over the phone but he still did not appear satisfied.  Antony Contras, MD Medical Director [facility] Pager:  [phone] 03/15/2022 3:33 PM  To contact Stroke Continuity provider, please refer to [email or web address] After hours, contact General Neurology

## 2022-03-15 NOTE — Progress Notes (Signed)
I was called by Dr. Leonel Ramsay upon findings of Ct brain at about 9pm 03/25/22. Per his discussion, the patient had no brainstem reflexes (absent corneal, dolls eyes), pupils were fixed and dilated. CT brain reviewed and I agreed that no intervention was warranted given her poor neurologic status and significant burden of BG hemorrhage with involvement of the midbrain, significant midline shift and evidence of hydrocephalus and extension of IVH into the 4th ventricle. Ich score is a 5 which carries a 100% mortality.   I was asked to speak to the brother on the phone this evening (by Dr. Leonie Man) as he wishes for surgical intervention. I explained to him the significant burden of the hemorrhage and that any surgical intervention is not warranted due to the mortality of this significant of an intracerebral hemorrhage with herniation, and a physical exam consistent with brain death. He continued to request surgery despite knowing that it would not help his sister. I explained this numerous times that it would be futile and he is requesting another opinion which I understand.  I offered my condolences and offered to answer any other questions. I again recommended against any surgical intervention.   Diane Berry, [facility]

## 2022-03-16 ENCOUNTER — Inpatient Hospital Stay (HOSPITAL_COMMUNITY): Payer: Medicaid Other

## 2022-03-16 ENCOUNTER — Other Ambulatory Visit (HOSPITAL_COMMUNITY): Payer: Self-pay

## 2022-03-16 DIAGNOSIS — I629 Nontraumatic intracranial hemorrhage, unspecified: Secondary | ICD-10-CM

## 2022-03-16 DIAGNOSIS — I639 Cerebral infarction, unspecified: Secondary | ICD-10-CM

## 2022-03-16 LAB — POCT I-STAT 7, (LYTES, BLD GAS, ICA,H+H)
Acid-base deficit: 7 mmol/L — ABNORMAL HIGH (ref 0.0–2.0)
Acid-base deficit: 3 mmol/L — ABNORMAL HIGH (ref 0.0–2.0)
Acid-base deficit: 4 mmol/L — ABNORMAL HIGH (ref 0.0–2.0)
Bicarbonate: 19.1 mmol/L — ABNORMAL LOW (ref 20.0–28.0)
Bicarbonate: 23.2 mmol/L (ref 20.0–28.0)
Bicarbonate: 25.8 mmol/L (ref 20.0–28.0)
Calcium, Ion: 1.39 mmol/L (ref 1.15–1.40)
Calcium, Ion: 1.35 mmol/L (ref 1.15–1.40)
Calcium, Ion: 1.43 mmol/L — ABNORMAL HIGH (ref 1.15–1.40)
HCT: 27 % — ABNORMAL LOW (ref 36.0–46.0)
HCT: 26 % — ABNORMAL LOW (ref 36.0–46.0)
HCT: 28 % — ABNORMAL LOW (ref 36.0–46.0)
Hemoglobin: 9.2 g/dL — ABNORMAL LOW (ref 12.0–15.0)
Hemoglobin: 8.8 g/dL — ABNORMAL LOW (ref 12.0–15.0)
Hemoglobin: 9.5 g/dL — ABNORMAL LOW (ref 12.0–15.0)
O2 Saturation: 99 %
O2 Saturation: 100 %
O2 Saturation: 99 %
Patient temperature: 99
Patient temperature: 99
Patient temperature: 99
Potassium: 4.3 mmol/L (ref 3.5–5.1)
Potassium: 4.1 mmol/L (ref 3.5–5.1)
Potassium: 4.2 mmol/L (ref 3.5–5.1)
Sodium: 144 mmol/L (ref 135–145)
Sodium: 147 mmol/L — ABNORMAL HIGH (ref 135–145)
Sodium: 147 mmol/L — ABNORMAL HIGH (ref 135–145)
TCO2: 20 mmol/L — ABNORMAL LOW (ref 22–32)
TCO2: 24 mmol/L (ref 22–32)
TCO2: 28 mmol/L (ref 22–32)
pCO2 arterial: 40.2 mmHg (ref 32–48)
pCO2 arterial: 43.8 mmHg (ref 32–48)
pCO2 arterial: 76.2 mmHg (ref 32–48)
pH, Arterial: 7.284 — ABNORMAL LOW (ref 7.35–7.45)
pH, Arterial: 7.139 — CL (ref 7.35–7.45)
pH, Arterial: 7.332 — ABNORMAL LOW (ref 7.35–7.45)
pO2, Arterial: 154 mmHg — ABNORMAL HIGH (ref 83–108)
pO2, Arterial: 145 mmHg — ABNORMAL HIGH (ref 83–108)
pO2, Arterial: 366 mmHg — ABNORMAL HIGH (ref 83–108)

## 2022-03-16 MED ORDER — SODIUM BICARBONATE 8.4 % IV SOLN
INTRAVENOUS | Status: AC
  Administered 2022-03-16: 50 meq via INTRAVENOUS
  Filled 2022-03-16: qty 50

## 2022-03-16 MED ORDER — SODIUM BICARBONATE 8.4 % IV SOLN: 50.0000 meq | Freq: Once | INTRAVENOUS | Status: AC

## 2022-03-16 MED ORDER — SODIUM CHLORIDE 0.9 % IV SOLN: 250.0000 mL | INTRAVENOUS | Status: DC

## 2022-03-16 MED ORDER — NOREPINEPHRINE 4 MG/250ML-% IV SOLN
2.0000 ug/min | INTRAVENOUS | Status: DC
  Administered 2022-03-16: 2 ug/min via INTRAVENOUS
  Administered 2022-03-16 – 2022-03-17 (×2): 8 ug/min via INTRAVENOUS
  Filled 2022-03-16 (×3): qty 250

## 2022-03-16 NOTE — Procedures (Signed)
Adult Brain Death Determination  Time of Examination: 04/01/2022 3:11 PM  No Evidence of /Cause of Reversible CNS Depression  Core temperature must be greater >36 degrees. Last temp: Temp: 99 F (37.2 C) (Note: If unable to achieve normothermia after 12 hours of temperature management, may consider proceeding with Brain Death Evaluation.):    yes  Evidence of severe metabolic perturbations that could potentate CNS depression. Consider glucose, Na, creatinine, PaCO2, SaO2.:    Absent  Evidence of drugs, by history or measurement, that could potentiate central nervous system depression: narcotics, ethanol, benzodiazepines, barbiturates, neuromuscular blockade.:     Absent  Absence of Cortical Function  GCS = 3:    yes  Absence of Brain Stem Reflexes and Responses  Pupils light-fixed    yes  Corneal reflexes:    Absent  Response to upper and lower airway stimulation, such as pharyngeal and endotracheal suctioning.:    Absent  Ocular response to head turning (eye movement).    Absent  Absence of Spontaneous Respirations  (Apnea test performed per Brain Death Policy. If not met due to hemodynamic/ventilatory instability, then perform EEG, TCD, or cerebral blow flow studies.)  1.   Spontaneous Respirations   Absent  2.   PaCO2 at start of apnea test:  43  3.   PaCO2 at end of apnea test:  76  4.   CO2 rise of 20 or greater from baseline:   yes  Document Confirmatory Test Utilized: (Optional) Nuclear cerebral flow, cerebral angiography (CT/MR angio), transcranial Doppler ultrasound, EEG, SSEP (record results).  Test results (if available):  TCD showed no flow  Patient pronounced dead by neurological criteria at 3:06 PM on 04/06/2022.  Jacky Kindle, MD 04/01/2022 3:11 PM

## 2022-03-16 NOTE — Progress Notes (Signed)
TCD completed. Refer to "CV Proc" under chart review to view preliminary results.  2022/04/04 2:01 PM Kelby Aline., MHA, RVT, RDCS, RDMS

## 2022-03-16 NOTE — Progress Notes (Signed)
STROKE TEAM PROGRESS NOTE   INTERVAL HISTORY Patient neurological condition remains unchanged with neurological exam consistent with brain death with patient unresponsive.  Fixed reactive pupils, absent corneal reflex and absent cough and gag and doll's eye reflexes.  No motor response to sternal rub or nailbed pressure.  Patient's family remains reluctant to withdraw ventilatory support at the present time particularly since her brother has expressed unwillingness to do so.  Discussed with patient's wife at the bedside he is unable to make any decision.  Discussed with Dr. Merrily Pew critical care medicine and plan is to obtain transcranial Doppler study to evaluate for cerebral circulatory arrest as well as start the patient on pressors and do apnea test    Vitals:   04-02-22 0315 2022/04/02 0330 02-Apr-2022 0345 Apr 02, 2022 0400  BP: (!) 75/46 (!) 73/55 (!) 78/54 (!) 64/47  Pulse: 88 90 86 86  Resp: (!) 24 (!) 24 (!) 24 (!) 24  Temp: 98.6 F (37 C) 98.6 F (37 C) 98.6 F (37 C) 98.6 F (37 C)  TempSrc:    Bladder  SpO2: 100% 100% 100% 100%  Weight:      Height:       CBC:  Recent Labs  Lab 03/11/2022 1909 03/27/2022 1918 03/13/2022 2023  WBC 17.9*  --   --   HGB 10.9* 12.9 11.2*  HCT 35.4* 38.0 33.0*  MCV 73.6*  --   --   PLT 274  --   --    Basic Metabolic Panel:  Recent Labs  Lab 03/15/2022 1909 03/23/2022 1918 04/06/2022 2023 03/15/22 0340  NA 142 143 142  --   K 2.9* 2.9* 3.0*  --   CL 108 107  --   --   CO2 20*  --   --   --   GLUCOSE 185* 186*  --   --   BUN 12 13  --   --   CREATININE 1.14* 1.10*  --   --   CALCIUM 9.4  --   --   --   MG  --   --   --  1.8   Lipid Panel:  Recent Labs  Lab 03/15/22 0340  TRIG 68   HgbA1c: No results for input(s): "HGBA1C" in the last 168 hours. Urine Drug Screen:  Recent Labs  Lab 03/31/2022 2130  LABOPIA NONE DETECTED  COCAINSCRNUR NONE DETECTED  LABBENZ POSITIVE*  AMPHETMU NONE DETECTED  THCU NONE DETECTED  LABBARB NONE DETECTED     Alcohol Level  Recent Labs  Lab 03/27/2022 1909  ETH <10    IMAGING past 24 hours CT HEAD WO CONTRAST ( )  Result Date: 03/15/2022 CLINICAL DATA:  Head trauma EXAM: CT HEAD WITHOUT CONTRAST TECHNIQUE: Contiguous axial images were obtained from the base of the skull through the vertex without intravenous contrast. RADIATION DOSE REDUCTION: This exam was performed according to the departmental dose-optimization program which includes automated exposure control, adjustment of the mA and/or kV according to patient size and/or use of iterative reconstruction technique. COMPARISON:  Head CT from yesterday FINDINGS: Brain: Massive intracranial hemorrhage centered in the left deep cerebrum with intraventricular clot that is diffuse and progressed. Dissecting hemorrhage in the brainstem and right cortical spinal radiations. There is hydrocephalus and diffuse edematous white matter. Worsening of elevated intracranial pressure with diffuse effacement of CSF and downward central descent. Vascular: No hyperdense vessel or unexpected calcification. Skull: Normal. Negative for fracture or focal lesion. Sinuses/Orbits: No acute finding. IMPRESSION: Massive intracranial hemorrhage with hydrocephalus.  Worsening intraventricular clot and elevated intracranial pressure since yesterday. Electronically Signed   By: Jorje Guild M.D.   On: 03/15/2022 05:44    PHYSICAL EXAM  Temp:  [95.5 F (35.3 C)-99.1 F (37.3 C)] 98.6 F (37 C) (10/09 0400) Pulse Rate:  [82-99] 86 (10/09 0400) Resp:  [21-27] 24 (10/09 0400) BP: (64-100)/(45-69) 64/47 (10/09 0400) SpO2:  [100 %] 100 % (10/09 0400) FiO2 (%):  [40 %] 40 % (10/09 0400)  General - critically ill elderly female, intubated on no sedation Cardiovascular - Regular rhythm and rate.  Neuro: Poor neurological exam. Intubated on no sedation.  Eyes closed.  Comatose.  Unresponsive. Skew eye deviation with left eye hypertrophic, pupils small and non reactive,  corneals absent bilaterally,  negative dolls eyes, No cough or gag, no breathing over the ventilator, no movement or response to noxious stimuli in all 4 extremities  ASSESSMENT/PLAN Diane Berry is a 61 y.o. female with history of  previous stroke and hypertension who presents with sudden onset dizziness and confusion leading to unresponsiveness this evening.  LKW 1750pm  Stroke: Massive left basal ganglia ICH with IVH , hydrocephalus , cytotoxic cerebral edema and brain herniation . ICH score 4 on admission Etiology:  likely hypertensive   CT head  1. Large left basal ganglia hemorrhage measuring 4.2 by 6.2 x 4.5 cm. Estimated volume is approximately 500 mL. 2. Intraventricular extension of hemorrhage. 3. Mass effect with effacement the sulci. 4. Midline shift measures 8 mm. 5. Hydrocephalus with ballooning of the right temporal tip. 6. Crowding of the foramen magnum.  Stability scan CT head -Massive intracranial hemorrhage with hydrocephalus. Worsening intraventricular clot and elevated intracranial pressure since yesterday.   2D Echo 8/29: EF 60-65%. mild concentric left ventricular hypertrophy. Left  ventricular diastolic parameters are consistent with Grade I diastolic dysfunction   LDL 131 HgbA1c 4.9 VTE prophylaxis - SCD's    Diet   Diet NPO time specified   aspirin 81 mg daily and clopidogrel 75 mg daily prior to admission, now on No antithrombotic.  Therapy recommendations:  pending Disposition:  pending  IVH Neurosurgery consulted. No medical interventions due to hemorrhage is not survivable Poor neurological exam. Patient is DNR with no escalation of care   Hypertension Hypotension Home meds:  amlodipine  UnStable Initially requiring cleviprex gtt- off now since hypotensive SBP goal 130-150 Received fluid boluses overnight due to hypotension Long-term BP goal normotensive  Hyperlipidemia Home meds:  atorvastatin, not resumed in hospital LDL 131, goal <  70 Continue statin at discharge  Acute Respiratory Failure with hypoxia Managed by CCM  Sedation off since last pm due to hypotension   Other Stroke Risk Factors Advanced Age >/= 28  Hx stroke/TIA  Other Active Problems Dysphagia   Hospital day # 2  Patient presented with unresponsiveness secondary to massive left basal ganglia hemorrhage with intraventricular hemorrhage, hydrocephalus and transfalcine, uncal and central brain herniation with poor neurological exam compatible with brain death.  Family understands poor prognosis but are unwilling to withdraw ventilatory support but unfortunately patient neurological exam suggest brain death   Long discussion with the patient's daughter  at the bedside as well as with Dr. Tacy Learn critical care medicine who agrees with the plan.plan to check apnea test and transcranial Doppler study to assess for cerebral circulatory arrest.  .  This patient is critically ill and at significant risk of neurological worsening, death and care requires constant monitoring of vital signs, hemodynamics,respiratory and cardiac monitoring, extensive review of  multiple databases, frequent neurological assessment, discussion with family, other specialists and medical decision making of high complexity.I have made any additions or clarifications directly to the above note.This critical care time does not reflect procedure time, or teaching time or supervisory time of PA/NP/Med Resident etc but could involve care discussion time.  I spent 30 minutes of neurocritical care time  in the care of  this patient.     Antony Contras, MD       To contact Stroke Continuity provider, please refer to [email or web address] After hours, contact General Neurology
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
## 2022-03-16 NOTE — Progress Notes (Signed)
Apnea test done at bedside with RT, RN, and MD at bedside. Patient placed back on vent after test was performed.

## 2022-03-16 NOTE — Progress Notes (Signed)
IV consult for USGPIV for vasopressor: Per Primary RN, USGPIV is  not needed at this time. Patient have a good working PIV and vasopressors will be stopped once parameters goes up for pronouncement. Patient is on comfort care.

## 2022-03-16 NOTE — TOC CAGE-AID Note (Signed)
Transition of Care [facility]) - CAGE-AID Screening   Patient Details  Name: Virgilene Stryker MRN: [id number] Date of Birth: Apr 23, 1961  Transition of Care [facility]) CM/SW Contact:    Benard Halsted, LCSW Phone Number: 04/02/2022, 5:30 PM   Clinical Narrative: Patient currently not medially appropriate for screening.    CAGE-AID Screening: Substance Abuse Screening unable to be completed due to: : Patient unable to participate

## 2022-03-16 NOTE — Progress Notes (Signed)
eLink Physician-Brief Progress Note Patient Name: Diane Berry DOB: Mar 29, 1961 MRN: [id number]   Date of Service  03-27-22  HPI/Events of Note  Patient declared brain dead earlier today but family is having difficulty understanding the implications of brain death.  eICU Interventions  I explained brain death concept to family via an interpreter, and advised that the patient will not be accepted for transfer to an outside hospital if she is brain dead.        Kerry Kass Shermon Bozzi Mar 27, 2022, 8:36 PM

## 2022-03-17 ENCOUNTER — Ambulatory Visit (INDEPENDENT_AMBULATORY_CARE_PROVIDER_SITE_OTHER): Payer: Self-pay | Admitting: Primary Care

## 2022-03-17 ENCOUNTER — Encounter (INDEPENDENT_AMBULATORY_CARE_PROVIDER_SITE_OTHER): Payer: Self-pay

## 2022-04-08 NOTE — Progress Notes (Signed)
Chaplain responded to request from pt's nurse to offer support to pt's daughter who was crying uncontrollably, keening for quite some time.  Staff offered emotional support but daughter could not stop crying.    Chaplain joined daughter at bedside and offered ministry of presence.  Pt on camera phone [place] family [place] who were also [place].  Pt soon leaned in for physical support and was able to fairly quickly stop crying.  Daughter disconnected call with [place] family.  Daughter showed photos of her mom with family, her many grandchildren, the newest born just yesterday.  Photos of multiple men and women in the family who are serving in the [employer] and Corporate treasurer.  Photos of grandchild graduating from college.  Very proud American family.  Daughter expressed her belief her mother is with Jesus where there is no more pain and suffering.  Bonney Roussel affirmed her in that belief.  Daughter has some  family in [place] who are on their way.  Eventually, daughter calm enough to leave her mother's bedside.  Chaplain and Security escorted daughter to her car.  [place]

## 2022-04-08 NOTE — Death Summary Note (Signed)
Patient ID: Diane Berry MRN: [id number] DOB/AGE: 1960/06/25 61 y.o.  Admit date: March 31, 2022 Death date: 2022/04/02 1506 pm  Admission Diagnoses: Intracranial hemorrhage  Cause of Death: Brain death secondary to massive basal ganglia hemorrhage with intraventricular extension, hydrocephalus, cytotoxic cerebral edema with transfalcine, uncal and central brain herniation with compression of brainstem and respiratory failure  Pertinent Medical Diagnosis: Principal Problem:   Intracranial hemorrhage (HCC) Active Problems:   Hypertensive crisis Cytotoxic cerebral edema Hydrocephalus Brain herniation Respiratory failure   Hospital Course: [name] is a 61 y.o. female with a history of previous stroke and hypertension who presents with sudden onset dizziness and confusion leading to unresponsiveness this evening.  She was last in her normal state of health about 5:50 PM, then complained of dizziness and collapsed.  She was brought in by EMS where she was intubated. CT reveals massive ICH. She had been steadily rehabilitating from her stroke, still needing assistance to walk with daughter.  Patient had very poor neurological exam on admission with NIH stroke scale of 36 and ICH score of 4.  CT head showed a large left basal ganglia hemorrhage measuring 4.2 x 6.2 x 4.5 cm with estimated volume of 500 mL with intraventricular extension, hydrocephalus and cytotoxic edema and midline shift of 8 mm.  Patient was intubated and admitted to the ICU.  Repeat CT scan of the head showed worsening intraventricular hemorrhage with increased intracranial pressure with hemorrhage dissecting into the brainstem and right corticospinal radiations.  Patient neurological exam remained quite poor with unresponsiveness, absent pupillary, corneal and cough reflexes with no motor response to painful stimuli.  After discussion with the patient's daughter and  was made DNR with no escalation of care.  Patient's family was  informed that she would likely progress fairly quickly to brain death.  Patient's family had a difficult time in withdrawing ventilator support  Hence patient had apnea test performed by critical care team and patient met criteria for neurological brain death and ventilatory support was withdrawn signed: Antony Contras 04-02-2022, 4:52 PM

## 2022-04-08 NOTE — Progress Notes (Signed)
Cardiac time of death 82. Pronounced by this RN and Wyn Quaker RN.

## 2022-04-08 DEATH — deceased

## 2022-06-09 ENCOUNTER — Ambulatory Visit: Payer: Self-pay | Admitting: Cardiology

## 2022-10-13 ENCOUNTER — Other Ambulatory Visit: Payer: Self-pay

## 2022-10-14 ENCOUNTER — Other Ambulatory Visit: Payer: Self-pay

## 2022-10-14 ENCOUNTER — Telehealth: Payer: Self-pay | Admitting: Primary Care

## 2022-10-14 NOTE — Telephone Encounter (Signed)
Copied from CRM [id number]. Topic: General - Other >> Oct 14, 2022 12:32 PM Santiya F wrote: Reason for CRM: Irving Burton with [facility] is calling in because [employer] insurance needs a copy of the pt's medical records. The corporate number is [phone]. Please follow up with insurance group. There is a Tree surgeon who is working with the group his name is Merri Ray- [phone].

## 2022-10-14 NOTE — Telephone Encounter (Signed)
Returned Casimiro Needle call and per Casimiro Needle I will need to contact Earvin Hansen and was provided his direct line which is [phone]. Reached to Arlys John in regards to this matter and per Arlys John the death certificate has pt daughter as her sister but in the system it states she is the daughter.. They are trying to get medical records but wants to know how to go about getting the death certificate fixed. Made Arlys John that I am unaware and I will forward this message to my practice admin for a follow up

## 2022-12-03 ENCOUNTER — Other Ambulatory Visit: Payer: Self-pay

## 2022-12-08 NOTE — Telephone Encounter (Signed)
Patient daughter inquiring about medical records . I've not seen patient in 2 years. Your information given to patient daughter. Note Returned Casimiro Needle call and per Casimiro Needle I will need to contact Earvin Hansen and was provided his direct line which is [phone]. Reached to Arlys John in regards to this matter and per Arlys John the death certificate has pt daughter as her sister but in the system it states she is the daughter.. They are trying to get medical records but wants to know how to go about getting the death certificate fixed. Made Arlys John that I am unaware and I will forward this message to my practice admin for a follow up

## 2023-01-11 ENCOUNTER — Other Ambulatory Visit: Payer: Self-pay
